# Patient Record
Sex: Female | Born: 1940 | Race: White | Hispanic: No | Smoking: Former smoker
Health system: Southern US, Community
[De-identification: ages and names within clinical notes are randomized; demographics above are authoritative.]

## PROBLEM LIST (undated history)

## (undated) DIAGNOSIS — I499 Cardiac arrhythmia, unspecified: Secondary | ICD-10-CM

## (undated) DIAGNOSIS — M1990 Unspecified osteoarthritis, unspecified site: Secondary | ICD-10-CM

## (undated) DIAGNOSIS — G459 Transient cerebral ischemic attack, unspecified: Secondary | ICD-10-CM

## (undated) DIAGNOSIS — Z86010 Personal history of colonic polyps: Secondary | ICD-10-CM

## (undated) DIAGNOSIS — E079 Disorder of thyroid, unspecified: Secondary | ICD-10-CM

## (undated) DIAGNOSIS — N289 Disorder of kidney and ureter, unspecified: Secondary | ICD-10-CM

## (undated) DIAGNOSIS — E785 Hyperlipidemia, unspecified: Secondary | ICD-10-CM

## (undated) DIAGNOSIS — K219 Gastro-esophageal reflux disease without esophagitis: Secondary | ICD-10-CM

## (undated) DIAGNOSIS — H269 Unspecified cataract: Secondary | ICD-10-CM

## (undated) DIAGNOSIS — I10 Essential (primary) hypertension: Secondary | ICD-10-CM

## (undated) DIAGNOSIS — E039 Hypothyroidism, unspecified: Secondary | ICD-10-CM

## (undated) DIAGNOSIS — T7840XA Allergy, unspecified, initial encounter: Secondary | ICD-10-CM

## (undated) DIAGNOSIS — D649 Anemia, unspecified: Secondary | ICD-10-CM

## (undated) DIAGNOSIS — J449 Chronic obstructive pulmonary disease, unspecified: Secondary | ICD-10-CM

## (undated) DIAGNOSIS — I639 Cerebral infarction, unspecified: Secondary | ICD-10-CM

## (undated) DIAGNOSIS — K5520 Angiodysplasia of colon without hemorrhage: Secondary | ICD-10-CM

## (undated) DIAGNOSIS — F419 Anxiety disorder, unspecified: Secondary | ICD-10-CM

## (undated) DIAGNOSIS — K5790 Diverticulosis of intestine, part unspecified, without perforation or abscess without bleeding: Secondary | ICD-10-CM

## (undated) DIAGNOSIS — K635 Polyp of colon: Secondary | ICD-10-CM

## (undated) HISTORY — DX: Unspecified cataract: H26.9

## (undated) HISTORY — DX: Transient cerebral ischemic attack, unspecified: G45.9

## (undated) HISTORY — DX: Angiodysplasia of colon without hemorrhage: K55.20

## (undated) HISTORY — PX: VAGINAL HYSTERECTOMY: SUR661

## (undated) HISTORY — PX: ESOPHAGOGASTRODUODENOSCOPY ENDOSCOPY: SHX5814

## (undated) HISTORY — DX: Allergy, unspecified, initial encounter: T78.40XA

## (undated) HISTORY — DX: Essential (primary) hypertension: I10

## (undated) HISTORY — DX: Hyperlipidemia, unspecified: E78.5

## (undated) HISTORY — DX: Anemia, unspecified: D64.9

## (undated) HISTORY — DX: Chronic obstructive pulmonary disease, unspecified: J44.9

## (undated) HISTORY — DX: Disorder of kidney and ureter, unspecified: N28.9

## (undated) HISTORY — PX: PARTIAL HYSTERECTOMY: SHX80

## (undated) HISTORY — DX: Diverticulosis of intestine, part unspecified, without perforation or abscess without bleeding: K57.90

## (undated) HISTORY — DX: Personal history of colonic polyps: Z86.010

## (undated) HISTORY — PX: APPENDECTOMY: SHX54

## (undated) HISTORY — DX: Polyp of colon: K63.5

---

## 1996-12-04 DIAGNOSIS — E785 Hyperlipidemia, unspecified: Secondary | ICD-10-CM | POA: Insufficient documentation

## 1996-12-04 DIAGNOSIS — E782 Mixed hyperlipidemia: Secondary | ICD-10-CM | POA: Insufficient documentation

## 2002-02-19 ENCOUNTER — Encounter: Payer: Self-pay | Admitting: Emergency Medicine

## 2002-02-19 ENCOUNTER — Emergency Department (HOSPITAL_COMMUNITY): Admission: EM | Admit: 2002-02-19 | Discharge: 2002-02-19 | Payer: Self-pay | Admitting: Emergency Medicine

## 2002-02-28 ENCOUNTER — Encounter: Admission: RE | Admit: 2002-02-28 | Discharge: 2002-02-28 | Payer: Self-pay | Admitting: Family Medicine

## 2002-02-28 ENCOUNTER — Encounter: Payer: Self-pay | Admitting: Family Medicine

## 2002-08-25 ENCOUNTER — Encounter: Payer: Self-pay | Admitting: Family Medicine

## 2002-08-25 ENCOUNTER — Encounter: Admission: RE | Admit: 2002-08-25 | Discharge: 2002-08-25 | Payer: Self-pay | Admitting: Family Medicine

## 2003-10-11 ENCOUNTER — Other Ambulatory Visit: Admission: RE | Admit: 2003-10-11 | Discharge: 2003-10-11 | Payer: Self-pay | Admitting: Family Medicine

## 2003-10-11 ENCOUNTER — Encounter (INDEPENDENT_AMBULATORY_CARE_PROVIDER_SITE_OTHER): Payer: Self-pay | Admitting: Internal Medicine

## 2003-10-11 LAB — CONVERTED CEMR LAB: Pap Smear: NORMAL

## 2003-11-04 ENCOUNTER — Emergency Department (HOSPITAL_COMMUNITY): Admission: EM | Admit: 2003-11-04 | Discharge: 2003-11-04 | Payer: Self-pay | Admitting: Emergency Medicine

## 2004-09-25 ENCOUNTER — Emergency Department (HOSPITAL_COMMUNITY): Admission: EM | Admit: 2004-09-25 | Discharge: 2004-09-25 | Payer: Self-pay | Admitting: Emergency Medicine

## 2004-09-26 ENCOUNTER — Ambulatory Visit: Payer: Self-pay | Admitting: Internal Medicine

## 2004-10-02 ENCOUNTER — Ambulatory Visit: Payer: Self-pay | Admitting: Internal Medicine

## 2004-12-13 ENCOUNTER — Ambulatory Visit: Payer: Self-pay | Admitting: Family Medicine

## 2005-06-02 ENCOUNTER — Ambulatory Visit: Payer: Self-pay | Admitting: Family Medicine

## 2005-08-01 ENCOUNTER — Ambulatory Visit: Payer: Self-pay | Admitting: Family Medicine

## 2005-08-19 ENCOUNTER — Ambulatory Visit: Payer: Self-pay | Admitting: Family Medicine

## 2006-05-18 ENCOUNTER — Ambulatory Visit: Payer: Self-pay | Admitting: Family Medicine

## 2006-05-22 ENCOUNTER — Ambulatory Visit: Payer: Self-pay | Admitting: Family Medicine

## 2006-06-20 ENCOUNTER — Emergency Department: Payer: Self-pay | Admitting: Emergency Medicine

## 2006-06-27 ENCOUNTER — Emergency Department: Payer: Self-pay | Admitting: Emergency Medicine

## 2006-07-29 ENCOUNTER — Ambulatory Visit: Payer: Self-pay | Admitting: Family Medicine

## 2006-08-07 ENCOUNTER — Ambulatory Visit: Payer: Self-pay | Admitting: Family Medicine

## 2007-01-22 ENCOUNTER — Encounter (INDEPENDENT_AMBULATORY_CARE_PROVIDER_SITE_OTHER): Payer: Self-pay | Admitting: Internal Medicine

## 2007-01-22 ENCOUNTER — Ambulatory Visit: Payer: Self-pay | Admitting: Family Medicine

## 2007-01-22 LAB — CONVERTED CEMR LAB
ALT: 21 units/L (ref 0–40)
AST: 24 units/L (ref 0–37)
Cholesterol: 105 mg/dL (ref 0–200)
HDL: 38.7 mg/dL — ABNORMAL LOW (ref >39.0)
LDL Cholesterol: 50 mg/dL (ref 0–99)
Total CHOL/HDL Ratio: 2.7
Triglycerides: 81 mg/dL (ref 0–149)
VLDL: 16 mg/dL (ref 0–40)
Vit D, 1,25-Dihydroxy: 35 (ref 20–57)

## 2007-02-01 ENCOUNTER — Telehealth: Payer: Self-pay | Admitting: Family Medicine

## 2007-04-21 ENCOUNTER — Telehealth (INDEPENDENT_AMBULATORY_CARE_PROVIDER_SITE_OTHER): Payer: Self-pay | Admitting: *Deleted

## 2007-05-14 ENCOUNTER — Emergency Department (HOSPITAL_COMMUNITY): Admission: EM | Admit: 2007-05-14 | Discharge: 2007-05-14 | Payer: Self-pay | Admitting: Emergency Medicine

## 2007-05-17 ENCOUNTER — Ambulatory Visit: Payer: Self-pay | Admitting: Family Medicine

## 2007-05-17 ENCOUNTER — Encounter (INDEPENDENT_AMBULATORY_CARE_PROVIDER_SITE_OTHER): Payer: Self-pay | Admitting: Internal Medicine

## 2007-05-17 DIAGNOSIS — F329 Major depressive disorder, single episode, unspecified: Secondary | ICD-10-CM | POA: Insufficient documentation

## 2007-05-17 DIAGNOSIS — E039 Hypothyroidism, unspecified: Secondary | ICD-10-CM | POA: Insufficient documentation

## 2007-05-17 DIAGNOSIS — R079 Chest pain, unspecified: Secondary | ICD-10-CM | POA: Insufficient documentation

## 2007-05-17 DIAGNOSIS — R0789 Other chest pain: Secondary | ICD-10-CM

## 2007-05-17 DIAGNOSIS — I498 Other specified cardiac arrhythmias: Secondary | ICD-10-CM | POA: Insufficient documentation

## 2007-05-18 LAB — CONVERTED CEMR LAB: TSH: 1.45 u[IU]/mL

## 2007-05-22 ENCOUNTER — Ambulatory Visit: Payer: Self-pay | Admitting: Internal Medicine

## 2007-05-22 ENCOUNTER — Inpatient Hospital Stay (HOSPITAL_COMMUNITY): Admission: EM | Admit: 2007-05-22 | Discharge: 2007-05-24 | Payer: Self-pay | Admitting: Emergency Medicine

## 2007-05-24 ENCOUNTER — Encounter: Payer: Self-pay | Admitting: Family Medicine

## 2007-05-26 ENCOUNTER — Ambulatory Visit: Payer: Self-pay | Admitting: Cardiology

## 2007-05-26 ENCOUNTER — Encounter: Payer: Self-pay | Admitting: Cardiology

## 2007-05-26 ENCOUNTER — Ambulatory Visit: Payer: Self-pay | Admitting: Family Medicine

## 2007-05-26 ENCOUNTER — Ambulatory Visit: Payer: Self-pay

## 2007-05-26 DIAGNOSIS — D539 Nutritional anemia, unspecified: Secondary | ICD-10-CM | POA: Insufficient documentation

## 2007-05-31 ENCOUNTER — Ambulatory Visit: Payer: Self-pay | Admitting: Cardiology

## 2007-06-03 ENCOUNTER — Ambulatory Visit: Payer: Self-pay | Admitting: Internal Medicine

## 2007-06-04 ENCOUNTER — Ambulatory Visit: Payer: Self-pay | Admitting: Internal Medicine

## 2007-06-08 ENCOUNTER — Encounter: Payer: Self-pay | Admitting: Internal Medicine

## 2007-06-11 ENCOUNTER — Ambulatory Visit: Payer: Self-pay | Admitting: Cardiology

## 2007-06-15 ENCOUNTER — Encounter: Payer: Self-pay | Admitting: Internal Medicine

## 2007-06-15 ENCOUNTER — Ambulatory Visit: Payer: Self-pay | Admitting: Internal Medicine

## 2007-06-15 ENCOUNTER — Encounter (INDEPENDENT_AMBULATORY_CARE_PROVIDER_SITE_OTHER): Payer: Self-pay | Admitting: Internal Medicine

## 2007-06-15 DIAGNOSIS — K635 Polyp of colon: Secondary | ICD-10-CM

## 2007-06-15 HISTORY — DX: Polyp of colon: K63.5

## 2007-06-18 ENCOUNTER — Ambulatory Visit: Payer: Self-pay | Admitting: Family Medicine

## 2007-06-18 ENCOUNTER — Encounter (INDEPENDENT_AMBULATORY_CARE_PROVIDER_SITE_OTHER): Payer: Self-pay | Admitting: Internal Medicine

## 2007-07-19 ENCOUNTER — Ambulatory Visit: Payer: Self-pay | Admitting: Family Medicine

## 2007-07-19 DIAGNOSIS — Z860100 Personal history of colon polyps, unspecified: Secondary | ICD-10-CM

## 2007-07-19 DIAGNOSIS — L03818 Cellulitis of other sites: Secondary | ICD-10-CM

## 2007-07-19 DIAGNOSIS — L02818 Cutaneous abscess of other sites: Secondary | ICD-10-CM | POA: Insufficient documentation

## 2007-07-19 HISTORY — DX: Personal history of colon polyps, unspecified: Z86.0100

## 2007-07-19 HISTORY — DX: Personal history of colonic polyps: Z86.010

## 2007-07-21 ENCOUNTER — Ambulatory Visit: Payer: Self-pay | Admitting: Internal Medicine

## 2007-07-22 LAB — CONVERTED CEMR LAB
ALT: 19 units/L (ref 0–35)
AST: 18 units/L (ref 0–37)
Albumin: 3.6 g/dL (ref 3.5–5.2)
Alkaline Phosphatase: 46 units/L (ref 39–117)
Bilirubin, Direct: 0.1 mg/dL (ref 0.0–0.3)
Cholesterol: 155 mg/dL (ref 0–200)
HDL: 51.1 mg/dL (ref >39.0)
LDL Cholesterol: 93 mg/dL (ref 0–99)
Total Bilirubin: 1.1 mg/dL (ref 0.3–1.2)
Total CHOL/HDL Ratio: 3
Total Protein: 6.6 g/dL (ref 6.0–8.3)
Triglycerides: 56 mg/dL (ref 0–149)
VLDL: 11 mg/dL (ref 0–40)

## 2007-09-15 ENCOUNTER — Encounter (INDEPENDENT_AMBULATORY_CARE_PROVIDER_SITE_OTHER): Payer: Self-pay | Admitting: Internal Medicine

## 2007-09-15 DIAGNOSIS — I10 Essential (primary) hypertension: Secondary | ICD-10-CM | POA: Insufficient documentation

## 2007-09-15 DIAGNOSIS — M810 Age-related osteoporosis without current pathological fracture: Secondary | ICD-10-CM | POA: Insufficient documentation

## 2007-09-24 ENCOUNTER — Ambulatory Visit: Payer: Self-pay | Admitting: Internal Medicine

## 2007-09-24 DIAGNOSIS — T148XXA Other injury of unspecified body region, initial encounter: Secondary | ICD-10-CM | POA: Insufficient documentation

## 2007-11-19 ENCOUNTER — Ambulatory Visit: Payer: Self-pay | Admitting: Family Medicine

## 2008-02-22 ENCOUNTER — Ambulatory Visit: Payer: Self-pay | Admitting: Family Medicine

## 2008-02-22 DIAGNOSIS — M79672 Pain in left foot: Secondary | ICD-10-CM

## 2008-02-22 DIAGNOSIS — M79671 Pain in right foot: Secondary | ICD-10-CM | POA: Insufficient documentation

## 2008-02-29 ENCOUNTER — Encounter (INDEPENDENT_AMBULATORY_CARE_PROVIDER_SITE_OTHER): Payer: Self-pay | Admitting: Internal Medicine

## 2008-04-05 ENCOUNTER — Ambulatory Visit: Payer: Self-pay | Admitting: Family Medicine

## 2008-04-05 ENCOUNTER — Telehealth (INDEPENDENT_AMBULATORY_CARE_PROVIDER_SITE_OTHER): Payer: Self-pay | Admitting: Internal Medicine

## 2008-04-18 ENCOUNTER — Telehealth (INDEPENDENT_AMBULATORY_CARE_PROVIDER_SITE_OTHER): Payer: Self-pay | Admitting: Internal Medicine

## 2008-07-17 ENCOUNTER — Encounter: Payer: Self-pay | Admitting: Family Medicine

## 2008-08-30 ENCOUNTER — Ambulatory Visit: Payer: Self-pay | Admitting: Family Medicine

## 2008-09-05 LAB — CONVERTED CEMR LAB
ALT: 29 units/L (ref 0–35)
AST: 25 units/L (ref 0–37)
BUN: 23 mg/dL (ref 6–23)
Basophils Absolute: 0 10*3/uL (ref 0.0–0.1)
Basophils Relative: 0.4 % (ref 0.0–3.0)
CO2: 29 meq/L (ref 19–32)
Calcium: 9.7 mg/dL (ref 8.4–10.5)
Chloride: 109 meq/L (ref 96–112)
Cholesterol: 192 mg/dL (ref 0–200)
Creatinine, Ser: 1.2 mg/dL (ref 0.4–1.2)
Eosinophils Absolute: 0.3 10*3/uL (ref 0.0–0.7)
Eosinophils Relative: 4.8 % (ref 0.0–5.0)
GFR calc Af Amer: 58 mL/min
GFR calc non Af Amer: 48 mL/min
Glucose, Bld: 90 mg/dL (ref 70–99)
HCT: 37.1 % (ref 36.0–46.0)
HDL: 56.6 mg/dL (ref >39.0)
Hemoglobin: 12.6 g/dL (ref 12.0–15.0)
LDL Cholesterol: 108 mg/dL — ABNORMAL HIGH (ref 0–99)
Lymphocytes Relative: 25.4 % (ref 12.0–46.0)
MCHC: 34.1 g/dL (ref 30.0–36.0)
MCV: 98.3 fL (ref 78.0–100.0)
Monocytes Absolute: 0.6 10*3/uL (ref 0.1–1.0)
Monocytes Relative: 9.4 % (ref 3.0–12.0)
Neutro Abs: 4 10*3/uL (ref 1.4–7.7)
Neutrophils Relative %: 60 % (ref 43.0–77.0)
Platelets: 206 10*3/uL (ref 150–400)
Potassium: 5.3 meq/L — ABNORMAL HIGH (ref 3.5–5.1)
RBC: 3.77 M/uL — ABNORMAL LOW (ref 3.87–5.11)
RDW: 12.4 % (ref 11.5–14.6)
Sodium: 143 meq/L (ref 135–145)
TSH: 6.13 microintl units/mL — ABNORMAL HIGH (ref 0.35–5.50)
Total CHOL/HDL Ratio: 3.4
Triglycerides: 137 mg/dL (ref 0–149)
VLDL: 27 mg/dL (ref 0–40)
WBC: 6.6 10*3/uL (ref 4.5–10.5)

## 2008-10-10 ENCOUNTER — Ambulatory Visit: Payer: Self-pay | Admitting: Family Medicine

## 2008-10-11 LAB — CONVERTED CEMR LAB
Potassium: 4.5 meq/L (ref 3.5–5.1)
TSH: 4.51 microintl units/mL (ref 0.35–5.50)

## 2008-10-13 ENCOUNTER — Ambulatory Visit: Payer: Self-pay | Admitting: Family Medicine

## 2008-10-13 DIAGNOSIS — M545 Low back pain, unspecified: Secondary | ICD-10-CM | POA: Insufficient documentation

## 2008-10-13 DIAGNOSIS — J040 Acute laryngitis: Secondary | ICD-10-CM | POA: Insufficient documentation

## 2008-11-03 ENCOUNTER — Encounter (INDEPENDENT_AMBULATORY_CARE_PROVIDER_SITE_OTHER): Payer: Self-pay | Admitting: Internal Medicine

## 2008-11-03 ENCOUNTER — Other Ambulatory Visit: Admission: RE | Admit: 2008-11-03 | Discharge: 2008-11-03 | Payer: Self-pay | Admitting: Family Medicine

## 2008-11-03 ENCOUNTER — Ambulatory Visit: Payer: Self-pay | Admitting: Family Medicine

## 2008-11-03 DIAGNOSIS — Z780 Asymptomatic menopausal state: Secondary | ICD-10-CM | POA: Insufficient documentation

## 2008-11-03 LAB — HM PAP SMEAR

## 2008-11-07 ENCOUNTER — Telehealth (INDEPENDENT_AMBULATORY_CARE_PROVIDER_SITE_OTHER): Payer: Self-pay | Admitting: Internal Medicine

## 2008-11-07 ENCOUNTER — Encounter (INDEPENDENT_AMBULATORY_CARE_PROVIDER_SITE_OTHER): Payer: Self-pay | Admitting: *Deleted

## 2008-11-17 ENCOUNTER — Ambulatory Visit: Payer: Self-pay | Admitting: Family Medicine

## 2008-11-22 ENCOUNTER — Encounter (INDEPENDENT_AMBULATORY_CARE_PROVIDER_SITE_OTHER): Payer: Self-pay | Admitting: *Deleted

## 2008-11-22 LAB — CONVERTED CEMR LAB
OCCULT 1: NEGATIVE
OCCULT 2: NEGATIVE
OCCULT 3: NEGATIVE

## 2009-04-11 ENCOUNTER — Ambulatory Visit: Payer: Self-pay | Admitting: Family Medicine

## 2009-04-13 LAB — CONVERTED CEMR LAB: TSH: 0.47 microintl units/mL (ref 0.35–5.50)

## 2009-05-07 ENCOUNTER — Telehealth: Payer: Self-pay | Admitting: Family Medicine

## 2009-07-10 ENCOUNTER — Ambulatory Visit: Payer: Self-pay | Admitting: Family Medicine

## 2009-07-25 ENCOUNTER — Encounter (INDEPENDENT_AMBULATORY_CARE_PROVIDER_SITE_OTHER): Payer: Self-pay | Admitting: Internal Medicine

## 2009-08-07 ENCOUNTER — Encounter (INDEPENDENT_AMBULATORY_CARE_PROVIDER_SITE_OTHER): Payer: Self-pay | Admitting: Internal Medicine

## 2009-08-07 ENCOUNTER — Encounter: Payer: Self-pay | Admitting: Family Medicine

## 2009-09-26 ENCOUNTER — Telehealth (INDEPENDENT_AMBULATORY_CARE_PROVIDER_SITE_OTHER): Payer: Self-pay | Admitting: Internal Medicine

## 2009-10-02 ENCOUNTER — Telehealth (INDEPENDENT_AMBULATORY_CARE_PROVIDER_SITE_OTHER): Payer: Self-pay | Admitting: Internal Medicine

## 2009-10-03 ENCOUNTER — Ambulatory Visit: Payer: Self-pay | Admitting: Family Medicine

## 2009-10-04 LAB — CONVERTED CEMR LAB
ALT: 23 units/L (ref 0–35)
AST: 24 units/L (ref 0–37)
BUN: 20 mg/dL (ref 6–23)
CO2: 30 meq/L (ref 19–32)
Calcium: 9.2 mg/dL (ref 8.4–10.5)
Chloride: 107 meq/L (ref 96–112)
Cholesterol: 164 mg/dL (ref 0–200)
Creatinine, Ser: 1.2 mg/dL (ref 0.4–1.2)
GFR calc non Af Amer: 47.55 mL/min (ref >60)
Glucose, Bld: 91 mg/dL (ref 70–99)
HDL: 53.4 mg/dL (ref >39.00)
LDL Cholesterol: 93 mg/dL (ref 0–99)
Potassium: 5.1 meq/L (ref 3.5–5.1)
Sodium: 143 meq/L (ref 135–145)
TSH: 1.21 microintl units/mL (ref 0.35–5.50)
Total CHOL/HDL Ratio: 3
Triglycerides: 87 mg/dL (ref 0.0–149.0)
VLDL: 17.4 mg/dL (ref 0.0–40.0)
Vit D, 25-Hydroxy: 33 ng/mL (ref 30–89)

## 2009-10-09 ENCOUNTER — Ambulatory Visit: Payer: Self-pay | Admitting: Family Medicine

## 2009-10-15 LAB — CONVERTED CEMR LAB
Basophils Absolute: 0.1 10*3/uL (ref 0.0–0.1)
Basophils Relative: 1.5 % (ref 0.0–3.0)
Eosinophils Absolute: 0.4 10*3/uL (ref 0.0–0.7)
Eosinophils Relative: 5 % (ref 0.0–5.0)
Folate: 14.3 ng/mL
HCT: 33.2 % — ABNORMAL LOW (ref 36.0–46.0)
Hemoglobin: 11 g/dL — ABNORMAL LOW (ref 12.0–15.0)
Iron: 70 ug/dL (ref 42–145)
Lymphocytes Relative: 21.4 % (ref 12.0–46.0)
Lymphs Abs: 1.6 10*3/uL (ref 0.7–4.0)
MCHC: 33.3 g/dL (ref 30.0–36.0)
MCV: 102.2 fL — ABNORMAL HIGH (ref 78.0–100.0)
Monocytes Absolute: 1.5 10*3/uL — ABNORMAL HIGH (ref 0.1–1.0)
Monocytes Relative: 19.5 % — ABNORMAL HIGH (ref 3.0–12.0)
Neutro Abs: 3.9 10*3/uL (ref 1.4–7.7)
Neutrophils Relative %: 52.6 % (ref 43.0–77.0)
Platelets: 198 10*3/uL (ref 150.0–400.0)
RBC: 3.25 M/uL — ABNORMAL LOW (ref 3.87–5.11)
RDW: 12 % (ref 11.5–14.6)
Saturation Ratios: 20.3 % (ref 20.0–50.0)
Transferrin: 246.6 mg/dL (ref 212.0–360.0)
Vitamin B-12: 849 pg/mL (ref 211–911)
WBC: 7.5 10*3/uL (ref 4.5–10.5)

## 2009-10-26 ENCOUNTER — Ambulatory Visit: Payer: Self-pay | Admitting: Family Medicine

## 2009-10-26 LAB — FECAL OCCULT BLOOD, GUAIAC: Fecal Occult Blood: NEGATIVE

## 2009-10-26 LAB — CONVERTED CEMR LAB
OCCULT 1: NEGATIVE
OCCULT 2: NEGATIVE
OCCULT 3: NEGATIVE

## 2009-10-29 ENCOUNTER — Encounter (INDEPENDENT_AMBULATORY_CARE_PROVIDER_SITE_OTHER): Payer: Self-pay | Admitting: *Deleted

## 2009-11-15 ENCOUNTER — Emergency Department (HOSPITAL_COMMUNITY)
Admission: EM | Admit: 2009-11-15 | Discharge: 2009-11-15 | Payer: Self-pay | Source: Home / Self Care | Admitting: Emergency Medicine

## 2009-11-21 ENCOUNTER — Ambulatory Visit: Payer: Self-pay | Admitting: Family Medicine

## 2009-12-06 ENCOUNTER — Telehealth: Payer: Self-pay | Admitting: Family Medicine

## 2010-01-02 ENCOUNTER — Ambulatory Visit: Payer: Self-pay | Admitting: Family Medicine

## 2010-01-24 ENCOUNTER — Telehealth: Payer: Self-pay | Admitting: Family Medicine

## 2010-02-13 ENCOUNTER — Telehealth: Payer: Self-pay | Admitting: Family Medicine

## 2010-03-05 ENCOUNTER — Emergency Department: Payer: Self-pay | Admitting: Internal Medicine

## 2010-03-05 ENCOUNTER — Encounter: Payer: Self-pay | Admitting: Family Medicine

## 2010-03-06 ENCOUNTER — Telehealth: Payer: Self-pay | Admitting: Family Medicine

## 2010-03-19 ENCOUNTER — Ambulatory Visit: Payer: Self-pay | Admitting: Family Medicine

## 2010-03-19 DIAGNOSIS — M25539 Pain in unspecified wrist: Secondary | ICD-10-CM | POA: Insufficient documentation

## 2010-03-20 ENCOUNTER — Telehealth: Payer: Self-pay | Admitting: Family Medicine

## 2010-04-11 ENCOUNTER — Telehealth: Payer: Self-pay | Admitting: Family Medicine

## 2010-04-17 ENCOUNTER — Telehealth: Payer: Self-pay | Admitting: Family Medicine

## 2010-05-21 ENCOUNTER — Encounter (INDEPENDENT_AMBULATORY_CARE_PROVIDER_SITE_OTHER): Payer: Self-pay | Admitting: *Deleted

## 2010-05-28 ENCOUNTER — Encounter (INDEPENDENT_AMBULATORY_CARE_PROVIDER_SITE_OTHER): Payer: Self-pay | Admitting: *Deleted

## 2010-07-01 ENCOUNTER — Encounter (INDEPENDENT_AMBULATORY_CARE_PROVIDER_SITE_OTHER): Payer: Self-pay | Admitting: *Deleted

## 2010-07-02 ENCOUNTER — Ambulatory Visit: Payer: Self-pay | Admitting: Family Medicine

## 2010-07-02 ENCOUNTER — Telehealth: Payer: Self-pay | Admitting: Family Medicine

## 2010-07-03 ENCOUNTER — Ambulatory Visit: Payer: Self-pay | Admitting: Internal Medicine

## 2010-07-19 ENCOUNTER — Ambulatory Visit: Payer: Self-pay | Admitting: Internal Medicine

## 2010-07-19 LAB — HM COLONOSCOPY

## 2010-07-27 ENCOUNTER — Encounter: Payer: Self-pay | Admitting: Internal Medicine

## 2010-08-02 ENCOUNTER — Encounter: Payer: Self-pay | Admitting: Family Medicine

## 2010-08-02 LAB — HM MAMMOGRAPHY: HM Mammogram: NORMAL

## 2010-08-06 ENCOUNTER — Encounter: Payer: Self-pay | Admitting: Family Medicine

## 2010-09-25 ENCOUNTER — Ambulatory Visit: Payer: Self-pay | Admitting: Family Medicine

## 2010-11-05 NOTE — Assessment & Plan Note (Signed)
Summary: RIGHT ARM PAIN,KNOT ON WRIST/CLE   Vital Signs:  Patient profile:   70 year old female Height:      62.5 inches Weight:      157.13 pounds BMI:     28.38 Temp:     98.3 degrees F oral Pulse rate:   60 / minute Pulse rhythm:   regular BP sitting:   122 / 62  (left arm) Cuff size:   regular  Vitals Entered By: Linde Gillis CMA Duncan Dull) (March 19, 2010 8:53 AM) CC: right arm pain, knot on right wrist   History of Present Illness: 70 yo here for right wrist pain.  Fell in kitchen on 5/31. Had xrays done in ER which were negative but since then, has developped more pain and swelling in right wrist.  Pain with rest or motion.  Has FROM. Normal strength. Occassionally gets tingling into her finger tips.  Current Medications (verified): 1)  Benazepril Hcl 10 Mg Tabs (Benazepril Hcl) .... Take 1 Tablet By Mouth Once A Day 2)  Lipitor 20 Mg Tabs (Atorvastatin Calcium) .... Take 1 Tablet By Mouth Once A Day 3)  Adult Aspirin Low Strength 81 Mg  Tbdp (Aspirin) .... Takes 2 By Mouth Every Morning 4)  Alprazolam 0.25 Mg  Tabs (Alprazolam) .... 1/2 -1 Q 6 Hours As Needed As Directed 5)  Citracal + D 250-200 Mg-Unit  Tabs (Calcium Citrate-Vitamin D) .... As Before 6)  Cvs Iron 325 (65 Fe) Mg  Tabs (Ferrous Sulfate) .... One Tab By Mouth Once Daily 7)  Clarinex 5 Mg  Tabs (Desloratadine) .... Take 1 Tablet By Mouth Once A Day As Needed Itch 8)  Levothroid 100 Mcg Tabs (Levothyroxine Sodium) .Marland Kitchen.. 1 Once Daily For Thyroid 9)  Vitamin B-12 500 Mcg  Tabs (Cyanocobalamin) .... One Daily 10)  Vitamin D 1000 Unit  Tabs (Cholecalciferol) .... Take Two Daily 11)  Multivitamins   Tabs (Multiple Vitamin) .... Take One Daily 12)  Fish Oil   Oil (Fish Oil) .... Take Two Daily 13)  Hair, Skin and Nails .... Otc As Directed.  Takes One Daily 14)  Valacyclovir Hcl 1 Gm Tabs (Valacyclovir Hcl) .... 2 Tab By Mouth Two Times A Day X 1 Day  Allergies: 1)  Codeine Phosphate (Codeine Phosphate)  Review  of Systems      See HPI General:  Denies fever. MS:  Complains of joint pain, joint redness, and joint swelling; denies loss of strength and muscle weakness.  Physical Exam  General:  Well-developed,well-nourished,in no acute distress; alert,appropriate and cooperative throughout examination Msk:  Right wrist- swollen, ecyhomosis, FROM, TTP over lateral aspect.normal strength. Psych:  Cognition and judgment appear intact. Alert and cooperative with normal attention span and concentration. No apparent delusions, illusions, hallucinations   Impression & Recommendations:  Problem # 1:  WRIST PAIN, RIGHT (ICD-719.43) Assessment New Will get repeat XRAY today. 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  Orders: T-Wrist Comp Right (73110TC)  Complete Medication List: 1)  Benazepril Hcl 10 Mg Tabs (Benazepril hcl) .... Take 1 tablet by mouth once a day 2)  Lipitor 20 Mg Tabs (Atorvastatin calcium) .... Take 1 tablet by mouth once a day 3)  Adult Aspirin Low Strength 81 Mg Tbdp (Aspirin) .... Takes 2 by mouth every morning 4)  Alprazolam 0.25 Mg Tabs (Alprazolam) .... 1/2 -1 q 6 hours as needed as directed 5)  Citracal + D 250-200 Mg-unit Tabs (Calcium citrate-vitamin d) .... As before 6)  Cvs Iron 325 (65 Fe) Mg Tabs (Ferrous sulfate) .... One tab by mouth once daily 7)  Clarinex 5 Mg Tabs (Desloratadine) .... Take 1 tablet by mouth once a day as needed itch 8)  Levothroid 100 Mcg Tabs (Levothyroxine sodium) .Marland Kitchen.. 1 once daily for thyroid 9)  Vitamin B-12 500 Mcg Tabs (Cyanocobalamin) .... One daily 10)  Vitamin D 1000 Unit Tabs (Cholecalciferol) .... Take two daily 11)  Multivitamins Tabs (Multiple vitamin) .... Take one daily 12)  Fish Oil Oil (Fish oil) .... Take two daily 13)  Hair, Skin and Nails  .... Otc as directed.  takes one daily 14)  Valacyclovir Hcl 1 Gm Tabs (Valacyclovir hcl) .... 2 tab by mouth two times a day x 1 day  Current Allergies  (reviewed today): CODEINE PHOSPHATE (CODEINE PHOSPHATE)

## 2010-11-05 NOTE — Assessment & Plan Note (Signed)
Summary: FLU SHOT/Carla Lyons/DLO   Nurse Visit   Allergies: 1)  Codeine Phosphate (Codeine Phosphate)  Immunizations Administered:  Influenza Vaccine # 1:    Vaccine Type: Fluvax 3+    Site: left deltoid    Mfr: [employer]axoSmith[employer]    Dose: 0.5 ml    Route: IM    Given by: Mervin Hack CMA (AAMA)    Exp. Date: 04/05/2011    Lot #: [id number]    VIS given: 04/30/10 version given July 02, 2010.  Flu Vaccine Consent Questions:    Do you have a history of severe allergic reactions to this vaccine? no    Any prior history of allergic reactions to egg and/or gelatin? no    Do you have a sensitivity to the preservative Thimersol? no    Do you have a past history of Guillan-Barre Syndrome? no    Do you currently have an acute febrile illness? no    Have you ever had a severe reaction to latex? no    Vaccine information given and explained to patient? yes    Are you currently pregnant? no  Orders Added: 1)  Flu Vaccine 51yrs + [90658] 2)  Admin 1st Vaccine [15400]

## 2010-11-05 NOTE — Letter (Signed)
Summary: Previsit letter  [facility]  [address] [place], [place] [address]   Phone: [phone]  Fax: [phone]       05/28/2010 MRN: [id number]  [facility] [address] [place], [place]  [address]  Dear Ms. Staubs,  Welcome to the Gastroenterology Division at [facility]    You are scheduled to see a nurse for your pre-procedure visit on 07-02-10 at 11:00a.m. on the 3rd floor at [facility], [address]  We ask that you try to arrive at our office 15 minutes prior to your appointment time to allow for check-in.  Your nurse visit will consist of discussing your medical and surgical history, your immediate family medical history, and your medications.    Please bring a complete list of all your medications or, if you prefer, bring the medication bottles and we will list them.  We will need to be aware of both prescribed and over the counter drugs.  We will need to know exact dosage information as well.  If you are on blood thinners (Coumadin, Plavix, Aggrenox, Ticlid, etc.) please call our office today/prior to your appointment, as we need to consult with your physician about holding your medication.   Please be prepared to read and sign documents such as consent forms, a financial agreement, and acknowledgement forms.  If necessary, and with your consent, a friend or relative is welcome to sit-in on the nurse visit with you.  Please bring your insurance card so that we may make a copy of it.  If your insurance requires a referral to see a specialist, please bring your referral form from your primary care physician.  No co-pay is required for this nurse visit.     If you cannot keep your appointment, please call [phone] to cancel or reschedule prior to your appointment date.  This allows [place] the opportunity to schedule an appointment for another patient in need of care.    Thank you for choosing [facility] for your medical needs.  We  appreciate the opportunity to care for you.  Please visit [place] at our website  to learn more about our practice.                     Sincerely.                                                                                                                   The Gastroenterology Division

## 2010-11-05 NOTE — Progress Notes (Signed)
Summary: refill request for valacyclovir  Phone Note Refill Request Message from:  Fax from Pharmacy  Refills Requested: Medication #1:  VALACYCLOVIR HCL 1 GM TABS 2 tab by mouth two times a day x 1 day.   Last Refilled: 01/02/2010 Faxed request from [facility].  Initial call taken by: Lowella Petties CMA,  January 24, 2010 8:46 AM    Prescriptions: VALACYCLOVIR HCL 1 GM TABS (VALACYCLOVIR HCL) 2 tab by mouth two times a day x 1 day  #30 x 0   Entered and Authorized by:   Ruthe Mannan MD   Signed by:   Ruthe Mannan MD on 01/24/2010   Method used:   Electronically to        [facility]* (retail)       [address]       [place], [place]  [address]       Ph: [phone]       Fax: [phone]   RxID:   [id number]

## 2010-11-05 NOTE — Assessment & Plan Note (Signed)
Summary: SORE IN MOUTH/ COLD SORES/BILLIE PATIENT/[facility]   Vital Signs:  Patient profile:   70 year old female Height:      62.5 inches Weight:      149.8 pounds BMI:     27.06 Temp:     97.8 degrees F oral Pulse rate:   60 / minute Pulse rhythm:   regular BP sitting:   100 / 62  (left arm) Cuff size:   regular  Vitals Entered By: Benny Lennert CMA Duncan Dull) (November 21, 2009 2:21 PM) CC: sore in mouth and cold sores Is Patient Diabetic? No   History of Present Illness: 2 weeks ago after URI viral..noted blisters on inside of lip, top and bottom...with white discharge.  Initially painful..improved now.  Pharmacist suggested Canka gel..helped minimally.  Then tried glyoxide. helped minimally.  No fatigue, no fever. Feels well otherwise. no new medicaines. Wears dentures, no new oral meds or objects.    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 After Caffeine  (ICD-530.81) 11)  Osteoporosis  (ICD-733.00) 12)  Hypertension  (ICD-401.9) 13)  Cellulitis/abscess, Site Nec  (ICD-682.8) 14)  Anemia Nos  (ICD-285.9) 15)  Chest Discomfort  (ICD-786.59) 16)  Hypothyroidism  (ICD-244.9) 17)  Depression  (ICD-311) 18)  Bradycardia  (ICD-427.89)  Current Medications (verified): 1)  Benazepril Hcl 10 Mg Tabs (Benazepril Hcl) .... Take 1 Tablet By Mouth Once A Day 2)  Lipitor 20 Mg Tabs (Atorvastatin Calcium) .... Take 1 Tablet By Mouth Once A Day 3)  Adult Aspirin Low Strength 81 Mg  Tbdp (Aspirin) .... Takes 2 By Mouth Every Morning 4)  Alprazolam 0.25 Mg  Tabs (Alprazolam) .... 1/2 -1 Q 6 Hours As Needed As Directed 5)  Citracal + D  250-200 Mg-Unit  Tabs (Calcium Citrate-Vitamin D) .... As Before 6)  Cvs Iron 325 (65 Fe) Mg  Tabs (Ferrous Sulfate) .... One Tab By Mouth Once Daily 7)  Clarinex 5 Mg  Tabs (Desloratadine) .... Take 1 Tablet By Mouth Once A Day As Needed Itch 8)  Levothroid 100 Mcg Tabs (Levothyroxine Sodium) .Marland Kitchen.. 1 Once Daily For Thyroid 9)  Vitamin B-12 500 Mcg  Tabs (Cyanocobalamin) .... One Daily 10)  Vitamin D 1000 Unit  Tabs (Cholecalciferol) .... Take Two Daily 11)  Multivitamins   Tabs (Multiple Vitamin) .... Take One Daily 12)  Fish Oil   Oil (Fish Oil) .... Take Two Daily 13)  Hair, Skin and Nails .... Otc As Directed.  Takes One Daily 14)  Valacyclovir Hcl 1 Gm Tabs (Valacyclovir Hcl) .... 2 Tab By Mouth Two Times A Day X 1 Day  Allergies: 1)  Codeine Phosphate (Codeine Phosphate)  Past History:  Past medical, surgical, family and social histories (including risk factors) reviewed, and no changes noted (except as noted below).  Past Medical History: Reviewed history from 09/15/2007 and no changes required. colon polyps--06/15/07 Hypertension Osteoporosis  Past Surgical History: Reviewed history from 09/15/2007 and no changes required. Transthroracic echo---05/18/2007--neg Holter monitor--8/08--neg colonoscopy--06/15/07--polyps Hysterectomy, partial - age 35 Appendectomy C-Section x 2 Abd. U/S - fatty liver 05/03 Head CT with and without wnl 08/23/02 DEXA 08/01/05  Family History: Reviewed  history from 09/15/2007 and no changes required. Father: Died at age 53, AAA surg - Embolus, osteoarthritis Mother: Alive, bypass surgery (83) - CAD Siblings: 2 brothers, one with arthritis and elevated BP, one L & W.                1sister L & W., one deceased at age 18 with lung disease.    MGF- died at age 40 or 64 with MI MGM died at age 28 - old age CV - none HBP - none DM - none No cancer ETOH/Drug Abuse - none  Social History: Reviewed history from 08/30/2008 and no changes  required. Marital Status:widow--husband died 2005-01-10 Children: 2 Occupation: retired   Review of Systems General:  Denies fatigue and fever. CV:  Denies chest pain or discomfort. Resp:  Denies shortness of breath. GI:  Denies abdominal pain.  Physical Exam  General:  Well-developed,well-nourished,in no acute distress; alert,appropriate and cooperative throughout examination Eyes:  No corneal or conjunctival inflammation noted. EOMI. Perrla. Funduscopic exam benign, without hemorrhages, exudates or papilledema. Vision grossly normal. Ears:  External ear exam shows no significant lesions or deformities.  Otoscopic examination reveals clear canals, tympanic membranes are intact bilaterally without bulging, retraction, inflammation or discharge. Hearing is grossly normal bilaterally. Nose:  External nasal examination shows no deformity or inflammation. Nasal mucosa are pink and moist without lesions or exudates. Mouth:  erythematous lesion inner bottom lip, not at vermillion border, no blister Neck:  no carotid bruit or thyromegaly no cervical or supraclavicular lymphadenopathy  Lungs:  Normal respiratory effort, chest expands symmetrically. Lungs are clear to auscultation, no crackles or wheezes. Heart:  Normal rate and regular rhythm. S1 and S2 normal without gallop, murmur, click, rub or other extra sounds.   Impression & Recommendations:  Problem # 1:  ORAL ULCER (ICD-528.9) ? due to irritant vs herpes simplex. Given persistance and pain associated.Marland Kitchenalthough now slowly improving. treat with 1 day valtrex. Avoid irritants. Call if not imrpoving as expected.   Complete Medication List: 1)  Benazepril Hcl 10 Mg Tabs (Benazepril hcl) .... Take 1 tablet by mouth once a day 2)  Lipitor 20 Mg Tabs (Atorvastatin calcium) .... Take 1 tablet by mouth once a day 3)  Adult Aspirin Low Strength 81 Mg Tbdp (Aspirin) .... Takes 2 by mouth every morning 4)  Alprazolam 0.25 Mg Tabs (Alprazolam) ....  1/2 -1 q 6 hours as needed as directed 5)  Citracal + D 250-200 Mg-unit Tabs (Calcium citrate-vitamin d) .... As before 6)  Cvs Iron 325 (65 Fe) Mg Tabs (Ferrous sulfate) .... One tab by mouth once daily 7)  Clarinex 5 Mg Tabs (Desloratadine) .... Take 1 tablet by mouth once a day as needed itch 8)  Levothroid 100 Mcg Tabs (Levothyroxine sodium) .Marland Kitchen.. 1 once daily for thyroid 9)  Vitamin B-12 500 Mcg Tabs (Cyanocobalamin) .... One daily 10)  Vitamin D 1000 Unit Tabs (Cholecalciferol) .... Take two daily 11)  Multivitamins Tabs (Multiple vitamin) .... Take one daily 12)  Fish Oil Oil (Fish oil) .... Take two daily 13)  Hair, Skin and Nails  .... Otc as directed.  takes one daily 14)  Valacyclovir Hcl 1 Gm Tabs (Valacyclovir hcl) .... 2 tab by mouth two times a day x 1 day Prescriptions: VALACYCLOVIR HCL 1 GM TABS (VALACYCLOVIR HCL) 2 tab by mouth two times a day x 1 day  #4 x 0   Entered and Authorized by:   Kerby Nora MD   Signed by:  Kerby Nora MD on 11/21/2009   Method used:   Electronically to        [facility]* (retail)       [address]       [place], [place]  [address]       Ph: [phone]       Fax: [phone]   RxID:   [id number]   Current Allergies (reviewed today): CODEINE PHOSPHATE (CODEINE PHOSPHATE)

## 2010-11-05 NOTE — Miscellaneous (Signed)
Summary: [facility] Previsit/prep  Clinical Lists Changes  Medications: Added new medication of MOVIPREP 100 GM  SOLR (PEG-KCL-NACL-NASULF-NA ASC-C) As per prep instructions. - Signed Rx of MOVIPREP 100 GM  SOLR (PEG-KCL-NACL-NASULF-NA ASC-C) As per prep instructions.;  #1 x 0;  Signed;  Entered by: Wyona Almas RN;  Authorized by: Iva Boop MD, [facility];  Method used: Electronically to [facility] [address], [place], [place]  [address], Ph: [phone], Fax: [phone] Allergies: Changed allergy or adverse reaction from CODEINE PHOSPHATE (CODEINE PHOSPHATE) to CODEINE PHOSPHATE (CODEINE PHOSPHATE)    Prescriptions: MOVIPREP 100 GM  SOLR (PEG-KCL-NACL-NASULF-NA ASC-C) As per prep instructions.  #1 x 0   Entered by:   Wyona Almas RN   Authorized by:   Iva Boop MD, [employer]   Signed by:   Wyona Almas RN on 07/03/2010   Method used:   Electronically to        [facility]* (retail)       [address]       [place], [place]  [address]       Ph: [phone]       Fax: [phone]   RxID:   [id number]

## 2010-11-05 NOTE — Letter (Signed)
Summary: Colonoscopy Letter  [facility]  [address] [place], [place] [address]   Phone: [phone]  Fax: [phone]      May 21, 2010 MRN: [id number]   [facility] [address] [place], [place]  [address]   Dear Ms. Coy,   According to your medical record, it is time for you to schedule a Colonoscopy. The American Cancer Society recommends this procedure as a method to detect early colon cancer. Patients with a family history of colon cancer, or a personal history of colon polyps or inflammatory bowel disease are at increased risk.  This letter has beeen generated based on the recommendations made at the time of your procedure. If you feel that in your particular situation this may no longer apply, please contact our office.  Please call our office at [phone] to schedule this appointment or to update your records at your earliest convenience.  Thank you for cooperating with [place] to provide you with the very best care possible.   Sincerely,   Iva Boop, M.D.  [facility] [phone]

## 2010-11-05 NOTE — Progress Notes (Signed)
Summary: pt has diarrhea  Phone Note Call from Patient   Caller: Patient Call For: Ruthe Mannan MD Summary of Call: Pt called to report that she has had diarrhea all afternoon, hit her all at once.  Asks what she can do.  No vomiting but some nausea.  Some abd pain, but not severe.  No fever.  Advised clear fluids, rest, avoid dairy products.  She took 2 immodium but I told her that you would advise against that, let the virus run it's course.  She will call tomorrow and let us know how she is doing.  She knows to seek care if she develops fever or sever abd pain. Initial call taken by: Lowella Petties CMA,  April 17, 2010 4:32 PM  Follow-up for Phone Call        agreed.

## 2010-11-05 NOTE — Assessment & Plan Note (Signed)
Summary: cold sores   Vital Signs:  Patient profile:   70 year old female Height:      62.5 inches Weight:      153.50 pounds BMI:     27.73 Temp:     98.7 degrees F oral Pulse rate:   80 / minute Pulse rhythm:   regular BP sitting:   130 / 70  (left arm) Cuff size:   regular  Vitals Entered By: Linde Gillis CMA Duncan Dull) (January 02, 2010 11:21 AM) CC: blisters in mouth   History of Present Illness: Here for recurrent blisters in mouth.  Was seen in february for cold sore in mouth after after URI viral..noted blisters on inside of lip, top and bottom...with white discharge.  Painful, tried Canka did not help.  Was given prescription for Valtrex, it helped but the problem has recurred twice since she was seen here, most recently yesterday.  No fatigue, no fever. Feels well otherwise. no new medicaines. Wears dentures, no new oral meds or objects.   She is under a lot of stress at home.  Current Medications (verified): 1)  Benazepril Hcl 10 Mg Tabs (Benazepril Hcl) .... Take 1 Tablet By Mouth Once A Day 2)  Lipitor 20 Mg Tabs (Atorvastatin Calcium) .... Take 1 Tablet By Mouth Once A Day 3)  Adult Aspirin Low Strength 81 Mg  Tbdp (Aspirin) .... Takes 2 By Mouth Every Morning 4)  Alprazolam 0.25 Mg  Tabs (Alprazolam) .... 1/2 -1 Q 6 Hours As Needed As Directed 5)  Citracal + D 250-200 Mg-Unit  Tabs (Calcium Citrate-Vitamin D) .... As Before 6)  Cvs Iron 325 (65 Fe) Mg  Tabs (Ferrous Sulfate) .... One Tab By Mouth Once Daily 7)  Clarinex 5 Mg  Tabs (Desloratadine) .... Take 1 Tablet By Mouth Once A Day As Needed Itch 8)  Levothroid 100 Mcg Tabs (Levothyroxine Sodium) .Marland Kitchen.. 1 Once Daily For Thyroid 9)  Vitamin B-12 500 Mcg  Tabs (Cyanocobalamin) .... One Daily 10)  Vitamin D 1000 Unit  Tabs (Cholecalciferol) .... Take Two Daily 11)  Multivitamins   Tabs (Multiple Vitamin) .... Take One Daily 12)  Fish Oil   Oil (Fish Oil) .... Take Two Daily 13)  Hair, Skin and Nails .... Otc As  Directed.  Takes One Daily 14)  Valacyclovir Hcl 1 Gm Tabs (Valacyclovir Hcl) .... 2 Tab By Mouth Two Times A Day X 1 Day  Allergies: 1)  Codeine Phosphate (Codeine Phosphate)  Review of Systems      See HPI General:  Denies chills and fever. ENT:  Denies difficulty swallowing.  Physical Exam  General:  Well-developed,well-nourished,in no acute distress; alert,appropriate and cooperative throughout examination Mouth:  erythematous lesion inner bottom lip, not at vermillion border, no blister Psych:  Cognition and judgment appear intact. Alert and cooperative with normal attention span and concentration. No apparent delusions, illusions, hallucinations   Impression & Recommendations:  Problem # 1:  ORAL ULCER (ICD-528.9) Assessment Deteriorated Explained that this will likely be a chronic issue for her, especially at times of physical and emotional stress. Refilled Valtrex.  Complete Medication List: 1)  Benazepril Hcl 10 Mg Tabs (Benazepril hcl) .... Take 1 tablet by mouth once a day 2)  Lipitor 20 Mg Tabs (Atorvastatin calcium) .... Take 1 tablet by mouth once a day 3)  Adult Aspirin Low Strength 81 Mg Tbdp (Aspirin) .... Takes 2 by mouth every morning 4)  Alprazolam 0.25 Mg Tabs (Alprazolam) .... 1/2 -1 q 6 hours as  needed as directed 5)  Citracal + D 250-200 Mg-unit Tabs (Calcium citrate-vitamin d) .... As before 6)  Cvs Iron 325 (65 Fe) Mg Tabs (Ferrous sulfate) .... One tab by mouth once daily 7)  Clarinex 5 Mg Tabs (Desloratadine) .... Take 1 tablet by mouth once a day as needed itch 8)  Levothroid 100 Mcg Tabs (Levothyroxine sodium) .Marland Kitchen.. 1 once daily for thyroid 9)  Vitamin B-12 500 Mcg Tabs (Cyanocobalamin) .... One daily 10)  Vitamin D 1000 Unit Tabs (Cholecalciferol) .... Take two daily 11)  Multivitamins Tabs (Multiple vitamin) .... Take one daily 12)  Fish Oil Oil (Fish oil) .... Take two daily 13)  Hair, Skin and Nails  .... Otc as directed.  takes one daily 14)   Valacyclovir Hcl 1 Gm Tabs (Valacyclovir hcl) .... 2 tab by mouth two times a day x 1 day Prescriptions: VALACYCLOVIR HCL 1 GM TABS (VALACYCLOVIR HCL) 2 tab by mouth two times a day x 1 day  #30 x 0   Entered and Authorized by:   Ruthe Mannan MD   Signed by:   Ruthe Mannan MD on 01/02/2010   Method used:   Electronically to        [facility]* (retail)       [address]       [place], [place]  [address]       Ph: [phone]       Fax: [phone]   RxID:   [id number]   Current Allergies (reviewed today): CODEINE PHOSPHATE (CODEINE PHOSPHATE)

## 2010-11-05 NOTE — Progress Notes (Signed)
Summary: Rx Alprazolam  Phone Note Refill Request Call back at [phone] Message from:  [facility] on July 02, 2010 11:00 AM  Refills Requested: Medication #1:  ALPRAZOLAM 0.25 MG  TABS 1/2 -1 q 6 hours as needed as directed   Last Refilled: 03/06/2010  Method Requested: Telephone to Pharmacy Initial call taken by: Sydell Axon LPN,  July 02, 2010 11:01 AM  Follow-up for Phone Call        Rx called to pharmacy Follow-up by: Linde Gillis CMA Duncan Dull),  July 02, 2010 11:30 AM    Prescriptions: ALPRAZOLAM 0.25 MG  TABS (ALPRAZOLAM) 1/2 -1 q 6 hours as needed as directed  #30 x 1   Entered and Authorized by:   Ruthe Mannan MD   Signed by:   Ruthe Mannan MD on 07/02/2010   Method used:   Telephoned to ...       [facility]* (retail)       [address]       [place], [place]  [address]       Ph: [phone]       Fax: [phone]   RxID:   [id number]

## 2010-11-05 NOTE — Progress Notes (Signed)
Summary: refill request for valcyclovir  Phone Note Refill Request Message from:  Fax from Pharmacy  Refills Requested: Medication #1:  VALACYCLOVIR HCL 1 GM TABS 2 tab by mouth two times a day x 1 day.   Last Refilled: 03/20/2010 Faxed request from [facility].  Initial call taken by: Lowella Petties CMA,  April 11, 2010 8:42 AM  Follow-up for Phone Call        this was just refilled for 30 pills in mid june and inst are to take just 2 pills when needed - does she really need refil already? Follow-up by: Judith Part MD,  April 11, 2010 12:14 PM  Additional Follow-up for Phone Call Additional follow up Details #1::        Left message for patient to call back. spoke with casie at [facility] and she was not sure why pt needed med so soon. Lewanda Rife LPN  April 11, 4741 12:28 PM    Pt says she didnt [place] up all [place] thanks-- that clarifies things px written on EMR for call in  Additional Follow-up by: Lowella Petties CMA,  April 11, 2010 4:44 PM    Additional Follow-up for Phone Call Additional follow up Details #2::    Medication phoned to [facility] pharmacy as instructed. Lewanda Rife LPN  April 13, 5955 8:24 AM   Prescriptions: VALACYCLOVIR HCL 1 GM TABS (VALACYCLOVIR HCL) 2 tab by mouth two times a day x 1 day  #30 x 0   Entered and Authorized by:   Judith Part MD   Signed by:   Lewanda Rife LPN on 38/75/6433   Method used:   Telephoned to ...       [facility]* (retail)       [address]       [place], [place]  [address]       Ph: [phone]       Fax: [phone]   RxID:   [id number]

## 2010-11-05 NOTE — Procedures (Signed)
Summary: Colonoscopy  Patient: Carla Lyons Note: All result statuses are Final unless otherwise noted.  Tests: (1) Colonoscopy (COL)   COL Colonoscopy           DONE     [facility]     [address]     [place], [place]  [address]           COLONOSCOPY PROCEDURE REPORT           PATIENT:  Carla Lyons, Carla Lyons  MR#:  [id number]     BIRTHDATE:  04-25-1941, 69 yrs. old  GENDER:  female     ENDOSCOPIST:  Iva Boop, MD, [facility]           PROCEDURE DATE:  07/19/2010     PROCEDURE:  Colonoscopy with snare polypectomy     ASA CLASS:  Class II     INDICATIONS:  surveillance and high-risk screening, history of     pre-cancerous (adenomatous) colon polyps 2008: 7 mm adenoma     removed from rectum, 4 other polyps all hyperplastic but one in     cecum and 12 mm     MEDICATIONS:   Fentanyl 50 mcg IV, Versed 6 mg IV           DESCRIPTION OF PROCEDURE:   After the risks benefits and     alternatives of the procedure were thoroughly explained, informed     consent was obtained.  Digital rectal exam was performed and     revealed no abnormalities.   The LB [id number] endoscope     was introduced through the anus and advanced to the cecum, which     was identified by both the appendix and ileocecal valve, without     limitations.  The quality of the prep was excellent, using     MoviPrep.  The instrument was then slowly withdrawn as the colon     was fully examined. Insertion: 2:54 minutes withdrawal: 13:29     minutes     <<PROCEDUREIMAGES>>           FINDINGS:  A sessile polyp was found at the hepatic flexure. It     was 8 - 10 mm in size. Polyps were snared without cautery.     Retrieval was successful. snare polyp  Mild diverticulosis was     found in the sigmoid colon.  This was otherwise a normal     examination of the colon.   Retroflexed views in the rectum     revealed no abnormalities.    The scope was then withdrawn from     the patient and the procedure completed.         COMPLICATIONS:  None     ENDOSCOPIC IMPRESSION:     1) 8 - 10 mm sessile polyp at the hepatic flexure - removed     2) Mild diverticulosis in the sigmoid colon     3) Otherwise normal examination, excellent prep     4) personal history of 7mm rectal adenoma and 4 hyperplastic     polyps. one 12 mm cecal - 2008           REPEAT EXAM:  In for Colonoscopy, pending biopsy results.           Iva Boop, MD, [facility]           CC:  The Patient           n.     eSIGNED:  Iva Boop at 07/19/2010 09:09 AM           Carla Lyons, [id number]  Note: An exclamation mark (!) indicates a result that was not dispersed into the flowsheet. Document Creation Date: 07/19/2010 9:10 AM _______________________________________________________________________  (1) Order result status: Final Collection or observation date-time: 07/19/2010 09:01 Requested date-time:  Receipt date-time:  Reported date-time:  Referring Physician:   Ordering Physician: Stan Head [phone]) Specimen Source:  Source: Launa Grill Order Number: [id number] Lab site:   Appended Document: Colonoscopy: 8-10 mm serrated adenoma at hepatic flexure   Colonoscopy  Procedure date:  07/19/2010  Findings:         1) 8 - 10 mm sessile polyp at the hepatic flexure - removed SERRATED ADENOMA     2) Mild diverticulosis in the sigmoid colon     3) Otherwise normal examination, excellent prep     4) personal history of 7mm rectal adenoma and 4 hyperplastic     polyps. one 12 mm cecal - 2008  Procedures Next Due Date:    Colonoscopy: 07/2013   Appended Document: Colonoscopy     Procedures Next Due Date:    Colonoscopy: 07/2013

## 2010-11-05 NOTE — Letter (Signed)
Summary: Patient Notice- Polyp Results  [facility]  [address]   [place], [place] [address]   Phone: [phone]  Fax: [phone]        July 27, 2010 MRN: [id number]    [facility] [address] [place], [place]  [address]    Dear Ms. Whitner,  The polyp removed from your colon was adenomatous. This means that it was pre-cancerous or that  it had the potential to change into cancer over time.   I recommend that you have a repeat colonoscopy in 3 years to determine if you have developed any new polyps over time and to screen for colorectal cancer. If you develop any new rectal bleeding, abdominal pain or significant bowel habit changes, please contact us before then.  In addition to repeating colonoscopy, changing health habits may reduce your risk of having more colon polyps and possibly, colon cancer. You may lower your risk of future polyps and colon cancer by adopting healthy habits such as not smoking or using tobacco (if you do), being physically active, losing weight (if overweight), and eating a diet which includes fruits and vegetables and limits red meat.  Please call us if you are having persistent problems or have questions about your condition that have not been fully answered at this time.   Sincerely,  Iva Boop MD, [facility]  This letter has been electronically signed by your physician.  Appended Document: Patient Notice- Polyp Results letter mailed

## 2010-11-05 NOTE — Assessment & Plan Note (Signed)
Summary: F/U AFTER LABS / LFW   Vital Signs:  Patient profile:   70 year old female Height:      62.5 inches Weight:      146.25 pounds BMI:     26.42 Temp:     98.6 degrees F oral Pulse rate:   60 / minute Pulse rhythm:   regular BP sitting:   136 / 72  (left arm) Cuff size:   regular  Vitals Entered By: Lewanda Rife LPN (October 09, 2009 2:51 PM)  CC:  follow up after labs.  History of Present Illness: Here for follow up of recent labs --taking lipitor 20 without side effects --tolerating benazepril for HBP without side effects -- taking levothroid for hypothyroidism  Has been refused x 3 at [facility] when tried to give, has been taking FeSO4 325 1  daily for 1 yr  Will be due for colonoscopy 06/06/2010--last one done 06/15/2007  Unsure of last DEXA--will get paper chart   Problems Prior to Update: 1)  Special Screening Malig Neoplasms Other Sites  (ICD-V76.49) 2)  Postmenopausal Status  (ICD-V49.81) 3)  Back Pain, Lumbar  (ICD-724.2) 4)  Laryngitis, Acute  (ICD-464.00) 5)  Insect Bite  (ICD-919.4) 6)  Foot Pain, Right  (ICD-729.5) 7)  Viral Infection  (ICD-079.99) 8)  Wound, Open  (ICD-879.8) 9)  Hyperlipidemia, Mixed  (ICD-272.2) 10)  Esophageal Reflux Esp. After Caffeine  (ICD-530.81) 11)  Osteoporosis  (ICD-733.00) 12)  Hypertension  (ICD-401.9) 13)  Cellulitis/abscess, Site Nec  (ICD-682.8) 14)  Anemia Nos  (ICD-285.9) 15)  Chest Discomfort  (ICD-786.59) 16)  Hypothyroidism  (ICD-244.9) 17)  Depression  (ICD-311) 18)  Bradycardia  (ICD-427.89)  Medications Prior to Update: 1)  Benazepril Hcl 10 Mg Tabs (Benazepril Hcl) .... Take 1 Tablet By Mouth Once A Day 2)  Lipitor 20 Mg Tabs (Atorvastatin Calcium) .... Take 1 Tablet By Mouth Once A Day 3)  Adult Aspirin Low Strength 81 Mg  Tbdp (Aspirin) .... Takes 2 By Mouth Every Morning 4)  Alprazolam 0.25 Mg  Tabs (Alprazolam) .... 1/2 -1 Q 6 Hours As Needed As Directed 5)  Citracal + D 250-200 Mg-Unit  Tabs (Calcium  Citrate-Vitamin D) .... As Before 6)  Cvs Iron 325 (65 Fe) Mg  Tabs (Ferrous Sulfate) .... One Tab By Mouth Once Daily For One Week Then Twice A Day. 7)  Clarinex 5 Mg  Tabs (Desloratadine) .... Take 1 Tablet By Mouth Once A Day As Needed Itch 8)  Darvocet-N 100 100-650 Mg Tabs (Propoxyphene N-Apap) .Marland Kitchen.. 1 Every 6 Hrs As Needed Back Pain 9)  Levothroid 100 Mcg Tabs (Levothyroxine Sodium) .Marland Kitchen.. 1 Once Daily For Thyroid  Allergies: 1)  Codeine Phosphate (Codeine Phosphate)  Past History:  Past Surgical History: Last updated: 10-13-2007 Transthroracic echo---05/18/2007--neg Holter monitor--8/08--neg colonoscopy--06/15/07--polyps Hysterectomy, partial - age 17 Appendectomy C-Section x 2 Abd. U/S - fatty liver 05/03 Head CT with and without wnl 08/23/02 DEXA 08/01/05  Family History: Last updated: 2007-10-13 Father: Died at age 19, AAA surg - Embolus, osteoarthritis Mother: Alive, bypass surgery (13) - CAD Siblings: 2 brothers, one with arthritis and elevated BP, one L & W.                1sister L & W., one deceased at age 21 with lung disease.    MGF- died at age 79 or 56 with MI MGM died at age 99 - old age CV - none HBP - none DM - none No cancer  ETOH/Drug Abuse - none  Social History: Last updated: 08/30/2008 Marital Status:widow--husband died 12/21/2004 Children: 2 Occupation: retired   Risk Factors: Caffeine Use: 1 (08/30/2008) Exercise: no (08/30/2008)  Risk Factors: Smoking Status: quit (09/15/2007) Passive Smoke Exposure: no (08/30/2008)  Past Medical History: Reviewed history from 09/15/2007 and no changes required. colon polyps--06/15/07 Hypertension Osteoporosis  Review of Systems CV:  Denies chest pain or discomfort, palpitations, swelling of feet, and swelling of hands. Resp:  Denies cough, shortness of breath, and wheezing. GI:  Denies bloody stools, constipation, diarrhea, nausea, and vomiting. MS:  Complains of stiffness. Psych:  Complains of  anxiety and depression; reports needs 1/2 alprazolam at times when becomes anxious doing better with grief of death of husband with increased caring for new grandson and his brothers--live in [place], she goes there on Monday and stays over to help get them off to school as parents have meedtings on Monday night--feels needed.  Physical Exam  General:  alert, well-developed, well-nourished, and well-hydrated.   Neck:  no thyromegaly, no JVD, and no carotid bruits.   Lungs:  normal respiratory effort, no intercostal retractions, no accessory muscle use, and normal breath sounds.   Heart:  normal rate, regular rhythm, and no murmur.   Extremities:  no edema either lower leg at 3:30pm Neurologic:  alert & oriented X3, sensation intact to light touch, and gait normal.   Psych:  normally interactive and slightly anxious.     Impression & Recommendations:  Problem # 1:  ANEMIA NOS (ICD-285.9) Assessment New will get CBC and iron studies--tx if needed discussed iron rich foods Her updated medication list for this problem includes:    Cvs Iron 325 (65 Fe) Mg Tabs (Ferrous sulfate) ..... One tab by mouth once daily    Vitamin B-12 500 Mcg Tabs (Cyanocobalamin) ..... One daily  Orders: Venipuncture ([address]98) TLB-CBC Platelet - w/Differential (85025-CBCD) TLB-IBC Pnl (Iron/FE;Transferrin) (83550-IBC) TLB-B12 + Folate Pnl (82746_82607-B12/FOL)  Problem # 2:  OSTEOPOROSIS (ICD-733.00) Assessment: Comment Only last DEXA 8/06--will schedule vit D level at bottom of nl--to take 2000mg  daily of vit D during winter and 1000mg  if in sun almost daily in summer Orders: Radiology Referral (Radiology)  Problem # 3:  HYPERLIPIDEMIA, MIXED (ICD-272.2) Assessment: Unchanged stable on current dose of lipitor--continue recheck in 66mo or as needed  Her updated medication list for this problem includes:    Lipitor 20 Mg Tabs (Atorvastatin calcium) .Marland Kitchen... Take 1 tablet by mouth once a day  Labs  Reviewed: SGOT: 24 (10/03/2009)   SGPT: 23 (10/03/2009)   HDL:53.40 (10/03/2009), 56.6 (08/30/2008)  LDL:93 (10/03/2009), 108 (11/91/4782)  Chol:164 (10/03/2009), 192 (08/30/2008)  Trig:87.0 (10/03/2009), 137 (08/30/2008)  Problem # 4:  HYPERTENSION (ICD-401.9) Assessment: Unchanged stable on current dose of benazepril--continue see backin 6 mo or as needed  Her updated medication list for this problem includes:    Benazepril Hcl 10 Mg Tabs (Benazepril hcl) .Marland Kitchen... Take 1 tablet by mouth once a day  BP today: 136/72 Prior BP: 140/70 (11/03/2008)  Labs Reviewed: K+: 5.1 (10/03/2009) Creat: : 1.2 (10/03/2009)   Chol: 164 (10/03/2009)   HDL: 53.40 (10/03/2009)   LDL: 93 (10/03/2009)   TG: 87.0 (10/03/2009)  Problem # 5:  HYPOTHYROIDISM (ICD-244.9) Assessment: Unchanged stable on current dose of levothroid--continue, recheck 6-71mo Her updated medication list for this problem includes:    Levothroid 100 Mcg Tabs (Levothyroxine sodium) .Marland Kitchen... 1 once daily for thyroid  Labs Reviewed: TSH: 1.21 (10/03/2009)    Chol: 164 (10/03/2009)   HDL: 53.40 (10/03/2009)  LDL: 93 (10/03/2009)   TG: 87.0 (10/03/2009)  Complete Medication List: 1)  Benazepril Hcl 10 Mg Tabs (Benazepril hcl) .... Take 1 tablet by mouth once a day 2)  Lipitor 20 Mg Tabs (Atorvastatin calcium) .... Take 1 tablet by mouth once a day 3)  Adult Aspirin Low Strength 81 Mg Tbdp (Aspirin) .... Takes 2 by mouth every morning 4)  Alprazolam 0.25 Mg Tabs (Alprazolam) .... 1/2 -1 q 6 hours as needed as directed 5)  Citracal + D 250-200 Mg-unit Tabs (Calcium citrate-vitamin d) .... As before 6)  Cvs Iron 325 (65 Fe) Mg Tabs (Ferrous sulfate) .... One tab by mouth once daily 7)  Clarinex 5 Mg Tabs (Desloratadine) .... Take 1 tablet by mouth once a day as needed itch 8)  Levothroid 100 Mcg Tabs (Levothyroxine sodium) .Marland Kitchen.. 1 once daily for thyroid 9)  Vitamin B-12 500 Mcg Tabs (Cyanocobalamin) .... One daily 10)  Vitamin D 1000 Unit  Tabs (Cholecalciferol) .... Take two daily 11)  Multivitamins Tabs (Multiple vitamin) .... Take one daily 12)  Fish Oil Oil (Fish oil) .... Take two daily 13)  Hair, Skin and Nails  .... Otc as directed.  takes one daily  Patient Instructions: 1)  refer for DEXA--[facility] 2)  Please schedule a follow-up appointment in 6 months--Dr Dayton Martes. Prescriptions: LEVOTHROID 100 MCG TABS (LEVOTHYROXINE SODIUM) 1 once daily for thyroid  #30 x 12   Entered and Authorized by:   Gildardo Griffes FNP   Signed by:   Gildardo Griffes FNP on 10/12/2009   Method used:   Telephoned to ...       [facility]* (retail)       [address]       [place], [place]  [address]       Ph: [phone]       Fax: [phone]   RxID:   [id number] ALPRAZOLAM 0.25 MG  TABS (ALPRAZOLAM) 1/2 -1 q 6 hours as needed as directed  #30 x 1   Entered and Authorized by:   Gildardo Griffes FNP   Signed by:   Gildardo Griffes FNP on 10/12/2009   Method used:   Telephoned to ...       [facility]* (retail)       [address]       [place], [place]  [address]       Ph: [phone]       Fax: [phone]   RxID:   [id number] BENAZEPRIL HCL 10 MG TABS (BENAZEPRIL HCL) Take 1 tablet by mouth once a day  #30 x 12   Entered and Authorized by:   Gildardo Griffes FNP   Signed by:   Gildardo Griffes FNP on 10/12/2009   Method used:   Telephoned to ...       [facility]* (retail)       [address]       [place], [place]  [address]       Ph: [phone]       Fax: [phone]   RxID:   [id number] LIPITOR 20 MG TABS (ATORVASTATIN CALCIUM) Take 1 tablet by mouth once a day  #30 x 12   Entered and Authorized by:   Gildardo Griffes FNP   Signed by:   Gildardo Griffes FNP on 10/12/2009   Method used:   Telephoned to ...       [facility]* (retail)       [address]  [place], [place]  [address]       Ph: [phone]       Fax:  [phone]   RxID:   [id number]   Current Allergies (reviewed today): CODEINE PHOSPHATE (CODEINE PHOSPHATE)   Preventive Care Screening  Bone Density:    Date:  06/04/2005    Results:  abnormal std dev

## 2010-11-05 NOTE — Progress Notes (Signed)
Summary: wants refill on valacyclovir  Phone Note Refill Request Call back at Home Phone [phone] Message from:  Patient  Refills Requested: Medication #1:  VALACYCLOVIR HCL 1 GM TABS 2 tab by mouth two times a day x 1 day. Pt was given this on2/16 for blisters in her mouth.  She is getting these blisters again and is asking if a refill can be called to [facility].  Initial call taken by: Lowella Petties CMA,  December 06, 2009 8:32 AM  Follow-up for Phone Call        done Follow-up by: Hannah Beat MD,  December 06, 2009 8:58 AM    Prescriptions: VALACYCLOVIR HCL 1 GM TABS (VALACYCLOVIR HCL) 2 tab by mouth two times a day x 1 day  #4 x 0   Entered and Authorized by:   Hannah Beat MD   Signed by:   Hannah Beat MD on 12/06/2009   Method used:   Electronically to        [facility]* (retail)       [address]       [place], [place]  [address]       Ph: [phone]       Fax: [phone]   RxID:   [id number]

## 2010-11-05 NOTE — Progress Notes (Signed)
Summary: refill request for valcyclovir  Phone Note Refill Request Message from:  Fax from Pharmacy  Refills Requested: Medication #1:  VALACYCLOVIR HCL 1 GM TABS 2 tab by mouth two times a day x 1 day.   Last Refilled: 02/13/2010 Faxed request from [facility], [phone].  Initial call taken by: Lowella Petties CMA,  March 20, 2010 9:53 AM    Prescriptions: VALACYCLOVIR HCL 1 GM TABS (VALACYCLOVIR HCL) 2 tab by mouth two times a day x 1 day  #30 x 0   Entered and Authorized by:   Ruthe Mannan MD   Signed by:   Ruthe Mannan MD on 03/20/2010   Method used:   Electronically to        [facility]* (retail)       [address]       [place], [place]  [address]       Ph: [phone]       Fax: [phone]   RxID:   [id number]

## 2010-11-05 NOTE — Letter (Signed)
Summary: Results Follow up Letter  [facility]  [address] [place], [place] [address]   Phone: [phone]  Fax: [phone]    08/06/2010 MRN: [id number]  [facility] [address] [place], [place]  [address]  Dear Ms. Remigio,  The following are the results of your recent test(s):  Test         Result    Pap Smear:        Normal _____  Not Normal _____ Comments: ______________________________________________________ Cholesterol: LDL(Bad cholesterol):         Your goal is less than:         HDL (Good cholesterol):       Your goal is more than: Comments:  ______________________________________________________ Mammogram:        Normal __X___  Not Normal _____ Comments:  ___________________________________________________________________ Hemoccult:        Normal _____  Not normal _______ Comments:    _____________________________________________________________________ Other Tests:    We routinely do not discuss normal results over the telephone.  If you desire a copy of the results, or you have any questions about this information we can discuss them at your next office visit.   Sincerely,      Linde Gillis, CMA (AAMA)for Dr. Ruthe Mannan

## 2010-11-05 NOTE — Letter (Signed)
Summary: Results Follow up Letter   at [facility]  [address] [place], [place] [address]   Phone: [phone]  Fax: [phone]    10/29/2009 MRN: [id number]     [facility] [address] [place], [place]  [address]    Dear Ms. Smart,  The following are the results of your recent test(s):  Test         Result    Pap Smear:        Normal _____  Not Normal _____ Comments: ______________________________________________________ Cholesterol: LDL(Bad cholesterol):         Your goal is less than:         HDL (Good cholesterol):       Your goal is more than: Comments:  ______________________________________________________ Mammogram:        Normal _____  Not Normal _____ Comments:  ___________________________________________________________________ Hemoccult:        Normal __x___  Not normal _______ Comments:  Repeat in 1 year   _____________________________________________________________________ Other Tests:    We routinely do not discuss normal results over the telephone.  If you desire a copy of the results, or you have any questions about this information we can discuss them at your next office visit.   Sincerely,   Laurita Quint MD

## 2010-11-05 NOTE — Letter (Signed)
Summary: [facility] Instructions  [facility]  [address] [place], [place] [address]   Phone: [phone]  Fax: [phone]       Carla Lyons    10/25/1940    MRN: [id number]        Procedure Day /Date: Friday 07-19-10     Arrival Time: 7:30 a.m.     Procedure Time: 8:30 a.m.     Location of Procedure:                    _x _  [facility] (4th Floor)   PREPARATION FOR COLONOSCOPY WITH MOVIPREP   Starting 5 days prior to your procedure 07-14-10 do not eat nuts, seeds, popcorn, corn, beans, peas,  salads, or any raw vegetables.  Do not take any fiber supplements (e.g. Metamucil, Citrucel, and Benefiber).  THE DAY BEFORE YOUR PROCEDURE         DATE:  07-18-10  DAY: Thursday  1.  Drink clear liquids the entire day-NO SOLID FOOD  2.  Do not drink anything colored red or purple.  Avoid juices with pulp.  No orange juice.  3.  Drink at least 64 oz. (8 glasses) of fluid/clear liquids during the day to prevent dehydration and help the prep work efficiently.  CLEAR LIQUIDS INCLUDE: Water Jello Ice Popsicles Tea (sugar ok, no milk/cream) Powdered fruit flavored drinks Coffee (sugar ok, no milk/cream) Gatorade Juice: apple, white grape, white cranberry  Lemonade Clear bullion, consomm, broth Carbonated beverages (any kind) Strained chicken noodle soup Hard Candy                             4.  In the morning, mix first dose of MoviPrep solution:    Empty 1 Pouch A and 1 Pouch B into the disposable container    Add lukewarm drinking water to the top line of the container. Mix to dissolve    Refrigerate (mixed solution should be used within 24 hrs)  5.  Begin drinking the prep at 5:00 p.m. The MoviPrep container is divided by 4 marks.   Every 15 minutes drink the solution down to the next mark (approximately 8 oz) until the full liter is complete.   6.  Follow completed prep with 16 oz of clear liquid of your choice (Nothing red or purple).   Continue to drink clear liquids until bedtime.  7.  Before going to bed, mix second dose of MoviPrep solution:    Empty 1 Pouch A and 1 Pouch B into the disposable container    Add lukewarm drinking water to the top line of the container. Mix to dissolve    Refrigerate  THE DAY OF YOUR PROCEDURE      DATE:  07-19-10  DAY:  Friday  Beginning at  3:30 a.m. (5 hours before procedure):         1. Every 15 minutes, drink the solution down to the next mark (approx 8 oz) until the full liter is complete.  2. Follow completed prep with 16 oz. of clear liquid of your choice.    3. You may drink clear liquids until  6:30 a.m.  (2 HOURS BEFORE PROCEDURE).   MEDICATION INSTRUCTIONS  Unless otherwise instructed, you should take regular prescription medications with a small sip of water   as early as possible the morning of your procedure.           OTHER INSTRUCTIONS  You will need a responsible adult at least 69 years of age to accompany you and drive you home.   This person must remain in the waiting room during your procedure.  Wear loose fitting clothing that is easily removed.  Leave jewelry and other valuables at home.  However, you may wish to bring a book to read or  an iPod/MP3 player to listen to music as you wait for your procedure to start.  Remove all body piercing jewelry and leave at home.  Total time from sign-in until discharge is approximately 2-3 hours.  You should go home directly after your procedure and rest.  You can resume normal activities the  day after your procedure.  The day of your procedure you should not:   Drive   Make legal decisions   Operate machinery   Drink alcohol   Return to work  You will receive specific instructions about eating, activities and medications before you leave.    The above instructions have been reviewed and explained to me by   Wyona Almas RN  July 03, 2010 11:35 AM     I fully understand and  can verbalize these instructions _____________________________ Date _________

## 2010-11-05 NOTE — Progress Notes (Signed)
Summary: refill request for valacyclovir  Phone Note Refill Request Message from:  Fax from Pharmacy  Refills Requested: Medication #1:  VALACYCLOVIR HCL 1 GM TABS 2 tab by mouth two times a day x 1 day.   Last Refilled: 01/24/2010 Faxed request from [place] [facility].  Initial call taken by: Lowella Petties CMA,  Feb 13, 2010 4:32 PM    Prescriptions: VALACYCLOVIR HCL 1 GM TABS (VALACYCLOVIR HCL) 2 tab by mouth two times a day x 1 day  #30 x 0   Entered and Authorized by:   Ruthe Mannan MD   Signed by:   Ruthe Mannan MD on 02/13/2010   Method used:   Electronically to        [facility]* (retail)       [address]       [place], [place]  [address]       Ph: [phone]       Fax: [phone]   RxID:   [id number]

## 2010-11-05 NOTE — Progress Notes (Signed)
Summary: Alprazolam  Phone Note Refill Request Message from:  Fax from Pharmacy on March 06, 2010 11:52 AM  Refills Requested: Medication #1:  ALPRAZOLAM 0.25 MG  TABS 1/2 -1 q 6 hours as needed as directed [facility]  Phone:   [phone]   Method Requested: Telephone to Pharmacy Initial call taken by: Delilah Shan CMA [facility]),  March 06, 2010 11:53 AM  Follow-up for Phone Call        Rx called to pharmacy Follow-up by: Linde Gillis CMA [facility] Dull),  March 06, 2010 12:03 PM    Prescriptions: ALPRAZOLAM 0.25 MG  TABS (ALPRAZOLAM) 1/2 -1 q 6 hours as needed as directed  #30 x 1   Entered and Authorized by:   Ruthe Mannan MD   Signed by:   Ruthe Mannan MD on 03/06/2010   Method used:   Telephoned to ...       [facility]* (retail)       [address]       [place], [place]  [address]       Ph: [phone]       Fax: [phone]   RxID:   [id number]

## 2010-11-07 NOTE — Assessment & Plan Note (Signed)
Summary: SORES IN FOUST   Vital Signs:  Patient profile:   70 year old female Weight:      143 pounds Temp:     98.1 degrees F oral Pulse rate:   56 / minute Pulse rhythm:   regular BP sitting:   140 / 82  (left arm) Cuff size:   regular  Vitals Entered By: Selena Batten Dance CMA (AAMA) (September 25, 2010 9:02 AM) CC: Check sores in mouth   History of Present Illness: CC: check soreness in mouth  2 d h/o soreness in mouth along with burning and tingling.  Left side of mouth.  Took all of med from Dr Dayton Martes (thinks was acyclovir).  has had this before.  looked like large canker sore this time.  did eat spicy food and increased holiday stress recently.  No other spots anywhere, no fevers/chills.    Current Medications (verified): 1)  Benazepril Hcl 10 Mg Tabs (Benazepril Hcl) .... Take 1 Tablet By Mouth Once A Day 2)  Lipitor 20 Mg Tabs (Atorvastatin Calcium) .... Take 1 Tablet By Mouth Once A Day 3)  Adult Aspirin Low Strength 81 Mg  Tbdp (Aspirin) .... Takes 2 By Mouth Every Morning 4)  Alprazolam 0.25 Mg  Tabs (Alprazolam) .... 1/2 -1 Q 6 Hours As Needed As Directed 5)  Citracal + D 250-200 Mg-Unit  Tabs (Calcium Citrate-Vitamin D) .... As Before 6)  Cvs Iron 325 (65 Fe) Mg  Tabs (Ferrous Sulfate) .... One Tab By Mouth Once Daily 7)  Clarinex 5 Mg  Tabs (Desloratadine) .... Take 1 Tablet By Mouth Once A Day As Needed Itch 8)  Levothroid 100 Mcg Tabs (Levothyroxine Sodium) .Marland Kitchen.. 1 Once Daily For Thyroid 9)  Vitamin B-12 500 Mcg  Tabs (Cyanocobalamin) .... One Daily 10)  Vitamin D 1000 Unit  Tabs (Cholecalciferol) .... Take Two Daily 11)  Multivitamins   Tabs (Multiple Vitamin) .... Take One Daily 12)  Fish Oil   Oil (Fish Oil) .... Take Two Daily 13)  Hair, Skin and Nails .... Otc As Directed.  Takes One Daily  Allergies: 1)  Codeine Phosphate (Codeine Phosphate)  Past History:  Past Medical History: Last updated: 09/15/2007 colon polyps--06/15/07 Hypertension Osteoporosis  Social  History: Last updated: 08/30/2008 Marital Status:widow--husband died 01-05-2005 Children: 2 Occupation: retired   Review of Systems       per HPI  Physical Exam  General:  Well-developed,well-nourished,in no acute distress; alert,appropriate and cooperative throughout examination Mouth:  white sore left inner lip, slight erythema around lesion.     Impression & Recommendations:  Problem # 1:  ORAL ULCER (ICD-528.9) consistent with cold sore.  Refilled Valtrex.  pt aware to use at beginning of lesions, not to use more than 1 day of treatment at at time  Complete Medication List: 1)  Benazepril Hcl 10 Mg Tabs (Benazepril hcl) .... Take 1 tablet by mouth once a day 2)  Lipitor 20 Mg Tabs (Atorvastatin calcium) .... Take 1 tablet by mouth once a day 3)  Adult Aspirin Low Strength 81 Mg Tbdp (Aspirin) .... Takes 2 by mouth every morning 4)  Alprazolam 0.25 Mg Tabs (Alprazolam) .... 1/2 -1 q 6 hours as needed as directed 5)  Citracal + D 250-200 Mg-unit Tabs (Calcium citrate-vitamin d) .... As before 6)  Cvs Iron 325 (65 Fe) Mg Tabs (Ferrous sulfate) .... One tab by mouth once daily 7)  Clarinex 5 Mg Tabs (Desloratadine) .... Take 1 tablet by mouth once a day as needed itch  8)  Levothroid 100 Mcg Tabs (Levothyroxine sodium) .Marland Kitchen.. 1 once daily for thyroid 9)  Vitamin B-12 500 Mcg Tabs (Cyanocobalamin) .... One daily 10)  Vitamin D 1000 Unit Tabs (Cholecalciferol) .... Take two daily 11)  Multivitamins Tabs (Multiple vitamin) .... Take one daily 12)  Fish Oil Oil (Fish oil) .... Take two daily 13)  Hair, Skin and Nails  .... Otc as directed.  takes one daily 14)  Valtrex 1 Gm Tabs (Valacyclovir hcl) .... 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 2)  Watch food and stress.  This can make outbreaks happen. 3)  refilled valtrex.  take one day course when you feel an outbreak coming. Prescriptions: VALTREX 1 GM  TABS (VALACYCLOVIR HCL) take two twice daily x 1 days (separate doses by 12 hours)  #30 x 0   Entered and Authorized by:   Eustaquio Boyden  MD   Signed by:   Eustaquio Boyden  MD on 09/25/2010   Method used:   Electronically to        [facility]* (retail)       [address]       [place], [place]  [address]       Ph: [phone]       Fax: [phone]   RxID:   [id number]    Orders Added: 1)  Est. Patient Level III [84132]    Current Allergies (reviewed today): CODEINE PHOSPHATE (CODEINE PHOSPHATE)

## 2010-11-13 ENCOUNTER — Ambulatory Visit: Payer: Medicare Other | Admitting: Family Medicine

## 2010-11-13 ENCOUNTER — Encounter: Payer: Self-pay | Admitting: Family Medicine

## 2010-11-13 DIAGNOSIS — J309 Allergic rhinitis, unspecified: Secondary | ICD-10-CM | POA: Insufficient documentation

## 2010-11-13 DIAGNOSIS — J019 Acute sinusitis, unspecified: Secondary | ICD-10-CM

## 2010-11-14 ENCOUNTER — Ambulatory Visit: Payer: Self-pay | Admitting: Family Medicine

## 2010-11-21 NOTE — Assessment & Plan Note (Signed)
Summary: head and sinus problems/jbb   Vital Signs:  Patient Profile:   70 Years Old Female CC:      Cold & URI symptoms Height:     62 inches Weight:      148 pounds BMI:     27.17 O2 Sat:      98 % O2 treatment:    Room Air Temp:     98.4 degrees F oral Pulse rate:   76 / minute Pulse rhythm:   regular Resp:     18 per minute BP sitting:   137 / 88  (right arm)  Pt. in pain?   yes    Location:   head    Intensity:   5    Type:       aching  Vitals Entered By: Levonne Spiller EMT-P (November 13, 2010 2:56 PM)              Is Patient Diabetic? No  Does patient need assistance? Functional Status Self care Ambulation Normal      Current Allergies (reviewed today): CODEINE PHOSPHATE (CODEINE PHOSPHATE)History of Present Illness History from: patient Chief Complaint: Cold & URI symptoms History of Present Illness: The patient presented today because she was having symptoms of sinus drainage congestion for the past 2 weeks that started getting worse in the past 4 days. She reports that she's been having sinus headaches and coughing and sneezing with runny nose and reports that she came in because she was concerned about the progressive symptoms that she was experiencing with her sinuses. She reports that she was having postnasal sinus drainage as well. She has had a productive cough with colored sputum. She denies fever and chills. No vomiting diarrhea or rash.  She Did Report a Post Nasal Drainage with Sore Throat.  REVIEW OF SYSTEMS Constitutional Symptoms      Denies fever, chills, night sweats, weight loss, weight gain, and fatigue.  Eyes       Denies change in vision, eye pain, eye discharge, glasses, contact lenses, and eye surgery. Ear/Nose/Throat/Mouth       Complains of frequent runny nose, sinus problems, and sore throat.      Denies hearing loss/aids, change in hearing, ear pain, ear discharge, dizziness, frequent nose bleeds, hoarseness, and tooth pain or bleeding.   Respiratory       Complains of productive cough.      Denies dry cough, wheezing, shortness of breath, asthma, bronchitis, and emphysema/COPD.      Comments: Yellow colored sputum Cardiovascular       Denies murmurs, chest pain, and tires easily with exhertion.    Gastrointestinal       Denies stomach pain, nausea/vomiting, diarrhea, constipation, blood in bowel movements, and indigestion. Genitourniary       Denies painful urination, blood or discharge from vagina, kidney stones, and loss of urinary control. Neurological       Complains of headaches.      Denies paralysis, seizures, and fainting/blackouts. Musculoskeletal       Denies muscle pain, joint pain, joint stiffness, decreased range of motion, redness, swelling, muscle weakness, and gout.  Skin       Denies bruising, unusual mles/lumps or sores, and hair/skin or nail changes.  Psych       Denies mood changes, temper/anger issues, anxiety/stress, speech problems, depression, and sleep problems.  Past History:  Family History: Last updated: 10/06/2007 Father: Died at age 92, AAA surg - Embolus, osteoarthritis Mother: Alive, bypass surgery (  75) - CAD Siblings: 2 brothers, one with arthritis and elevated BP, one L & W.                1sister L & W., one deceased at age 62 with lung disease.    MGF- died at age 23 or 89 with MI MGM died at age 47 - old age CV - none HBP - none DM - none No cancer ETOH/Drug Abuse - none  Social History: Last updated: 11/13/2010 Marital Status:widow--husband died Dec 24, 2004 Children: 2 Occupation: retired  Never Smoked Alcohol use-no Drug use-no  Risk Factors: Caffeine Use: 1 (08/30/2008) Exercise: no (08/30/2008)  Risk Factors: Smoking Status: never (11/13/2010) Passive Smoke Exposure: no (08/30/2008)  Past Medical History: colon polyps--06/15/07 Hypertension Osteoporosis allergic rhinitis  Past Surgical History: Reviewed history from 09/15/2007 and no changes  required. Transthroracic echo---05/18/2007--neg Holter monitor--8/08--neg colonoscopy--06/15/07--polyps Hysterectomy, partial - age 27 Appendectomy C-Section x 2 Abd. U/S - fatty liver 05/03 Head CT with and without wnl 08/23/02 DEXA 08/01/05  Family History: Reviewed history from 09/15/2007 and no changes required. Father: Died at age 57, AAA surg - Embolus, osteoarthritis Mother: Alive, bypass surgery (21) - CAD Siblings: 2 brothers, one with arthritis and elevated BP, one L & W.                1sister L & W., one deceased at age 37 with lung disease.    MGF- died at age 27 or 77 with MI MGM died at age 58 - old age CV - none HBP - none DM - none No cancer ETOH/Drug Abuse - none  Social History: Marital Status:widow--husband died 12-24-2004 Children: 2 Occupation: retired  Never Smoked Alcohol use-no Drug use-no Smoking Status:  never Physical Exam General appearance: well developed, well nourished, no acute distress Head: normocephalic, atraumatic Eyes: conjunctivae and lids normal Pupils: equal, round, reactive to light Ears: normal, no lesions or deformities Nasal: swollen red turbinates with congestion Oral/Pharynx: dry mucous membranes, tongue normal, posterior pharynx without erythema or exudate Neck: neck supple,  trachea midline, no masses Chest/Lungs: no rales, wheezes, or rhonchi bilateral, breath sounds equal without effort Heart: regular rate and  rhythm, no murmur Abdomen: soft, non-tender without obvious organomegaly Extremities: normal extremities Neurological: grossly intact and non-focal Skin: no obvious rashes or lesions MSE: oriented to time, place, and person Assessment New Problems: ALLERGIC RHINITIS CAUSE UNSPECIFIED (ICD-477.9) ACUTE SINUSITIS, UNSPECIFIED (ICD-461.9)   Patient Education: Patient and/or caregiver instructed in the following: rest, fluids. The risks, benefits and possible side effects were clearly explained and discussed  with the patient.  The patient verbalized clear understanding.  The patient was given instructions to return if symptoms don't improve, worsen or new changes develop.  If it is not during clinic hours and the patient cannot get back to this clinic then the patient was told to seek medical care at an available urgent care or emergency department.  The patient verbalized understanding.   Demonstrates willingness to comply.  Plan New Medications/Changes: PREDNISONE 20 MG TABS (PREDNISONE) take 2 tabs by mouth x 1 dose  #2 x 0, 11/13/2010, Clanford Johnson MD AZITHROMYCIN 250 MG TABS (AZITHROMYCIN) take 2 tabs by mouth on day 1, then take 1 tab by mouth daily until completed  #6 x 0, 11/13/2010, Clanford Johnson MD FLUTICASONE PROPIONATE 50 MCG/ACT SUSP (FLUTICASONE PROPIONATE) 2 sprays per nostril once daily  #1 x 1, 11/13/2010, Clanford Johnson MD LORATADINE 10 MG TABS (LORATADINE) take 1 by mouth daily  #30 x  1, 11/13/2010, Standley Dakins MD  Follow Up: Follow up in 2-3 days if no improvement, Follow up on an as needed basis, Follow up with Primary Physician  The patient and/or caregiver has been counseled thoroughly with regard to medications prescribed including dosage, schedule, interactions, rationale for use, and possible side effects and they verbalize understanding.  Diagnoses and expected course of recovery discussed and will return if not improved as expected or if the condition worsens. Patient and/or caregiver verbalized understanding.  Prescriptions: PREDNISONE 20 MG TABS (PREDNISONE) take 2 tabs by mouth x 1 dose  #2 x 0   Entered and Authorized by:   Standley Dakins MD   Signed by:   Standley Dakins MD on 11/13/2010   Method used:   Electronically to        [facility]* (retail)       [address]       [place], [place]  [address]       Ph: [phone]       Fax: [phone]   RxID:   [id number] AZITHROMYCIN 250 MG TABS (AZITHROMYCIN) take 2 tabs by mouth on day 1,  then take 1 tab by mouth daily until completed  #6 x 0   Entered and Authorized by:   Standley Dakins MD   Signed by:   Standley Dakins MD on 11/13/2010   Method used:   Electronically to        [facility]* (retail)       [address]       [place], [place]  [address]       Ph: [phone]       Fax: [phone]   RxID:   [id number] FLUTICASONE PROPIONATE 50 MCG/ACT SUSP (FLUTICASONE PROPIONATE) 2 sprays per nostril once daily  #1 x 1   Entered and Authorized by:   Standley Dakins MD   Signed by:   Standley Dakins MD on 11/13/2010   Method used:   Electronically to        [facility]* (retail)       [address]       [place], [place]  [address]       Ph: [phone]       Fax: [phone]   RxID:   [id number] LORATADINE 10 MG TABS (LORATADINE) take 1 by mouth daily  #30 x 1   Entered and Authorized by:   Standley Dakins MD   Signed by:   Standley Dakins MD on 11/13/2010   Method used:   Electronically to        [facility]* (retail)       [address]       [place], [place]  [address]       Ph: [phone]       Fax: [phone]   RxID:   [id number]   Patient Instructions: 1)  Go to the pharmacy and pick up your prescription (s).  It may take up to 30 mins for electronic prescriptions to be delivered to the pharmacy.  Please call if your pharmacy has not received your prescriptions after 30 minutes.   2)  Take your antibiotic as prescribed until ALL of it is gone, but stop if you develop a rash or swelling and contact our office as soon as possible. 3)  Acute sinusitis symptoms for less than 10 days are not helped by antibiotics.Use warm moist compresses, and over the counter decongestants ( only as directed). Call if no improvement in 5-7 days, sooner if increasing  pain, fever, or new symptoms. 4)  Return or go to the ER if no improvement or symptoms getting worse.   5)  The patient's prescriptions were checked for possible interactions and  electronically sent to the pharmacy of choice.  6)  The patient was informed that there is no on-call provider or services available at this clinic during off-hours (when the clinic is closed).  If the patient developed a problem or concern that required immediate attention, the patient was advised to go the the nearest available urgent care or emergency department for medical care.  The patient verbalized understanding.

## 2010-12-03 ENCOUNTER — Other Ambulatory Visit: Payer: Self-pay | Admitting: Family Medicine

## 2010-12-03 ENCOUNTER — Telehealth (INDEPENDENT_AMBULATORY_CARE_PROVIDER_SITE_OTHER): Payer: Self-pay | Admitting: *Deleted

## 2010-12-03 ENCOUNTER — Encounter (INDEPENDENT_AMBULATORY_CARE_PROVIDER_SITE_OTHER): Payer: Self-pay | Admitting: *Deleted

## 2010-12-03 ENCOUNTER — Other Ambulatory Visit (INDEPENDENT_AMBULATORY_CARE_PROVIDER_SITE_OTHER): Payer: Medicare Other

## 2010-12-03 DIAGNOSIS — E782 Mixed hyperlipidemia: Secondary | ICD-10-CM

## 2010-12-03 DIAGNOSIS — I10 Essential (primary) hypertension: Secondary | ICD-10-CM

## 2010-12-03 DIAGNOSIS — E039 Hypothyroidism, unspecified: Secondary | ICD-10-CM

## 2010-12-03 LAB — BASIC METABOLIC PANEL
BUN: 21 mg/dL (ref 6–23)
CO2: 27 mEq/L (ref 19–32)
Calcium: 9.3 mg/dL (ref 8.4–10.5)
Chloride: 104 mEq/L (ref 96–112)
Creatinine, Ser: 1.2 mg/dL (ref 0.4–1.2)
GFR: 47.38 mL/min — ABNORMAL LOW (ref >60.00)
Glucose, Bld: 91 mg/dL (ref 70–99)
Potassium: 5.1 mEq/L (ref 3.5–5.1)
Sodium: 140 mEq/L (ref 135–145)

## 2010-12-03 LAB — HEPATIC FUNCTION PANEL
ALT: 15 U/L (ref 0–35)
AST: 19 U/L (ref 0–37)
Albumin: 3.9 g/dL (ref 3.5–5.2)
Alkaline Phosphatase: 57 U/L (ref 39–117)
Bilirubin, Direct: 0.1 mg/dL (ref 0.0–0.3)
Total Bilirubin: 0.9 mg/dL (ref 0.3–1.2)
Total Protein: 6.9 g/dL (ref 6.0–8.3)

## 2010-12-03 LAB — LIPID PANEL
Cholesterol: 165 mg/dL (ref 0–200)
HDL: 55.5 mg/dL (ref >39.00)
LDL Cholesterol: 91 mg/dL (ref 0–99)
Total CHOL/HDL Ratio: 3
Triglycerides: 91 mg/dL (ref 0.0–149.0)
VLDL: 18.2 mg/dL (ref 0.0–40.0)

## 2010-12-03 LAB — TSH: TSH: 1.48 u[IU]/mL (ref 0.35–5.50)

## 2010-12-05 ENCOUNTER — Encounter (INDEPENDENT_AMBULATORY_CARE_PROVIDER_SITE_OTHER): Payer: Medicare Other | Admitting: Family Medicine

## 2010-12-05 ENCOUNTER — Encounter: Payer: Self-pay | Admitting: Family Medicine

## 2010-12-05 DIAGNOSIS — Z0000 Encounter for general adult medical examination without abnormal findings: Secondary | ICD-10-CM

## 2010-12-05 LAB — HM SIGMOIDOSCOPY

## 2010-12-12 NOTE — Progress Notes (Signed)
----   Converted from flag ---- ---- 12/02/2010 2:02 PM, Ruthe Mannan MD wrote: TSH (244.9), BMET (401.9), fasting lipid panel, hepatic panel (272.4) Thanks!  ---- 12/02/2010 9:44 AM, Liane Comber CMA (AAMA) wrote: Lab orders please! Good Morning! This pt is scheduled for cpx labs Tuesday, which labs to draw and dx codes to use? Thanks Tasha ------------------------------

## 2010-12-12 NOTE — Assessment & Plan Note (Signed)
Summary: MEDICARE CPX/CLE  MEDICARE/UHC,MAILED MEDICARE FORM   Vital Signs:  Patient profile:   70 year old female Height:      62 inches Weight:      148.50 pounds BMI:     27.26 Temp:     98.1 degrees F oral Pulse rate:   79 / minute Pulse rhythm:   regular BP sitting:   140 / 90  (right arm) Cuff size:   regular  Vitals Entered By: Linde Gillis CMA Duncan Dull) (December 05, 2010 8:12 AM) CC: medicare annual wellness  Vision Screening:Left eye with correction: 20 / 20 Right eye with correction: 20 / 20 Both eyes with correction: 20 / 25  Color vision testing: normal      Vision Entered By: Linde Gillis CMA (AAMA) (December 05, 2010 8:21 AM)  Hearing Screen 40db HL: Left  500 hz: 40db 1000 hz: 40db 2000 hz: 40db 4000 hz: 40db Right  500 hz: 40db 1000 hz: 40db 2000 hz: 40db 4000 hz: 40db    History of Present Illness: 70 yo here for medicare annual wellness visit.  I have personally reviewed the Medicare Annual Wellness questionnaire and have noted 1.   The patient's medical and social history- labs reviewed with pt.  Cholesterol- stable on Lipitor 20 mg daily, LFTS within normal limits.  Denies any side effects. Hypothyroidism- denies any symptoms of hypo or hyper thyroidism.  TSH wnl, on Levothroid 100 mch daily.  2.   Their use of alcohol, tobacco or illicit drugs-none 3.   Their current medications and supplements 4.   The patient's functional ability including ADL's, fall risks, home safety risks and hearing or visual i mpairment- see geriatrics assessment. 5.   Diet and physical activities- very physically active, helps to care for her new grandchild. 6.   Evidence for depression or mood disorders-none, at times gets sad around anniversary of husband's death but she feels she is doing ok.  Stays very active and has a great support system.   Current Medications (verified): 1)  Benazepril Hcl 10 Mg Tabs (Benazepril Hcl) .... Take 1 Tablet By Mouth Once A Day 2)   Lipitor 20 Mg Tabs (Atorvastatin Calcium) .... Take 1 Tablet By Mouth Once A Day 3)  Adult Aspirin Low Strength 81 Mg  Tbdp (Aspirin) .... Takes 2 By Mouth Every Morning 4)  Alprazolam 0.25 Mg  Tabs (Alprazolam) .... 1/2 -1 Q 6 Hours As Needed As Directed 5)  Citracal + D 250-200 Mg-Unit  Tabs (Calcium Citrate-Vitamin D) .... As Before 6)  Cvs Iron 325 (65 Fe) Mg  Tabs (Ferrous Sulfate) .... One Tab By Mouth Once Daily 7)  Levothroid 100 Mcg Tabs (Levothyroxine Sodium) .Marland Kitchen.. 1 Once Daily For Thyroid 8)  Vitamin B-12 500 Mcg  Tabs (Cyanocobalamin) .... One Daily 9)  Vitamin D 1000 Unit  Tabs (Cholecalciferol) .... Take Two Daily 10)  Multivitamins   Tabs (Multiple Vitamin) .... Take One Daily 11)  Fish Oil   Oil (Fish Oil) .... Take Two Daily 12)  Hair, Skin and Nails .... Otc As Directed.  Takes One Daily 13)  Valtrex 1 Gm Tabs (Valacyclovir Hcl) .... Take Two Twice Daily X 1 Days (Separate Doses By 12 Hours) 14)  Loratadine 10 Mg Tabs (Loratadine) .... Take 1 By Mouth Daily 15)  Fluticasone Propionate 50 Mcg/act Susp (Fluticasone Propionate) .... 2 Sprays Per Nostril Once Daily  Allergies: 1)  Codeine Phosphate (Codeine Phosphate)  Past History:  Past Medical History: Last  updated: 11/13/2010 colon polyps--06/15/07 Hypertension Osteoporosis allergic rhinitis  Past Surgical History: Last updated: 10-10-2007 Transthroracic echo---05/18/2007--neg Holter monitor--8/08--neg colonoscopy--06/15/07--polyps Hysterectomy, partial - age 67 Appendectomy C-Section x 2 Abd. U/S - fatty liver 05/03 Head CT with and without wnl 08/23/02 DEXA 08/01/05  Family History: Last updated: 10-10-07 Father: Died at age 59, AAA surg - Embolus, osteoarthritis Mother: Alive, bypass surgery (44) - CAD Siblings: 2 brothers, one with arthritis and elevated BP, one L & W.                1sister L & W., one deceased at age 54 with lung disease.    MGF- died at age 63 or 50 with MI MGM died at age 55 -  old age CV - none HBP - none DM - none No cancer ETOH/Drug Abuse - none  Social History: Last updated: 11/13/2010 Marital Status:widow--husband died 01-09-05 Children: 2 Occupation: retired  Never Smoked Alcohol use-no Drug use-no  Risk Factors: Caffeine Use: 1 (08/30/2008) Exercise: no (08/30/2008)  Risk Factors: Smoking Status: never (11/13/2010) Passive Smoke Exposure: no (08/30/2008)  Review of Systems      See HPI General:  Denies malaise. Eyes:  Denies blurring. ENT:  Denies difficulty swallowing. CV:  Denies chest pain or discomfort. Resp:  Denies shortness of breath. GI:  Denies abdominal pain and change in bowel habits. GU:  Denies abnormal vaginal bleeding. MS:  Denies joint pain, joint redness, and joint swelling. Derm:  Denies rash. Neuro:  Denies headaches. Psych:  Denies anxiety and depression. Endo:  Denies cold intolerance, heat intolerance, and weight change. Heme:  Denies abnormal bruising and bleeding.  Physical Exam  General:  Well-developed,well-nourished,in no acute distress; alert,appropriate and cooperative throughout examination Head:  Normocephalic and atraumatic without obvious abnormalities. No apparent alopecia or balding. Mouth:  white sore left inner lip, slight erythema around lesion.   Neurologic:  alert & oriented X3 and gait normal.   Psych:  Cognition and judgment appear intact. Alert and cooperative with normal attention span and concentration. No apparent delusions, illusions, hallucinations   Impression & Recommendations:  Problem # 1:  Preventive Health Care (ICD-V70.0)  The patients weight, height, BMI and visual acuity have been recorded in the chart I have made referrals, counseling and provided education to the patient based review of the above and I have provided the pt with a written personalized care plan for preventive services.  MOST form filled out, scanned, and given to pt for her records.  Orders: Medicare  -1st Annual Wellness Visit [id number])  Problem # 2:  HYPERLIPIDEMIA, MIXED (ICD-272.2) Assessment: Unchanged stable, labs reviewed with Ms. Bowron in office today. Her updated medication list for this problem includes:    Lipitor 20 Mg Tabs (Atorvastatin calcium) .Marland Kitchen... Take 1 tablet by mouth once a day  Problem # 3:  HYPOTHYROIDISM (ICD-244.9) Assessment: Unchanged stable, labs reviewed with Ms. Duignan in office today. Her updated medication list for this problem includes:    Levothroid 100 Mcg Tabs (Levothyroxine sodium) .Marland Kitchen... 1 once daily for thyroid  Complete Medication List: 1)  Benazepril Hcl 10 Mg Tabs (Benazepril hcl) .... Take 1 tablet by mouth once a day 2)  Lipitor 20 Mg Tabs (Atorvastatin calcium) .... Take 1 tablet by mouth once a day 3)  Adult Aspirin Low Strength 81 Mg Tbdp (Aspirin) .... Takes 2 by mouth every morning 4)  Alprazolam 0.25 Mg Tabs (Alprazolam) .... 1/2 -1 q 6 hours as needed as directed 5)  Citracal + D 250-200  Mg-unit Tabs (Calcium citrate-vitamin d) .... As before 6)  Cvs Iron 325 (65 Fe) Mg Tabs (Ferrous sulfate) .... One tab by mouth once daily 7)  Levothroid 100 Mcg Tabs (Levothyroxine sodium) .Marland Kitchen.. 1 once daily for thyroid 8)  Vitamin B-12 500 Mcg Tabs (Cyanocobalamin) .... One daily 9)  Vitamin D 1000 Unit Tabs (Cholecalciferol) .... Take two daily 10)  Multivitamins Tabs (Multiple vitamin) .... Take one daily 11)  Fish Oil Oil (Fish oil) .... Take two daily 12)  Hair, Skin and Nails  .... Otc as directed.  takes one daily 13)  Valtrex 1 Gm Tabs (Valacyclovir hcl) .... Take two twice daily x 1 days (separate doses by 12 hours) 14)  Loratadine 10 Mg Tabs (Loratadine) .... Take 1 by mouth daily 15)  Fluticasone Propionate 50 Mcg/act Susp (Fluticasone propionate) .... 2 sprays per nostril once daily Prescriptions: ALPRAZOLAM 0.25 MG  TABS (ALPRAZOLAM) 1/2 -1 q 6 hours as needed as directed  #30 x 1   Entered and Authorized by:   Ruthe Mannan MD   Signed by:    Ruthe Mannan MD on 12/05/2010   Method used:   Print then Give to Patient   RxID:   [id number]    Orders Added: 1)  Medicare -1st Annual Wellness Visit [G0438]    Current Allergies (reviewed today): CODEINE PHOSPHATE (CODEINE PHOSPHATE)  Flex Sig Next Due:  Not Indicated Last Hemoccult Result: negative (10/26/2009 10:43:15 AM) Hemoccult Next Due:  Not Indicated Last PAP:  NEGATIVE FOR INTRAEPITHELIAL LESIONS OR MALIGNANCY. (11/03/2008 12:00:00 AM) PAP Next Due:  Not Indicated Last Mammogram:  normal (07/25/2009 10:24:04 AM) Mammogram Result Date:  08/02/2010 Mammogram Result:  normal   Geriatric Assessment:  Activities of Daily Living:    Bathing-independent    Dressing-independent    Eating-independent    Toileting-independent    Transferring-independent    Continence-independent  Instrumental Activities of Daily Living:    Transportation-independent    Meal/Food Preparation-independent    Shopping Errands-independent    Housekeeping/Chores-independent    Money Management/Finances-independent    Medication Management-independent    Ability to Use Telephone-independent    Laundry-independent

## 2010-12-12 NOTE — Letter (Signed)
Summary: Nature conservation officer [employer] Wellness Visit Questionnaire   [employer] Annual Wellness Visit Questionnaire   Imported By: Beau Fanny 12/05/2010 15:34:19  _____________________________________________________________________  External Attachment:    Type:   Image     Comment:   External Document

## 2010-12-17 NOTE — Miscellaneous (Signed)
Summary: Medical Orders for Scope of Treatment (MOST) Form  Medical Orders for Scope of Treatment (MOST) Form   Imported By: Lanelle Bal 12/09/2010 12:59:59  _____________________________________________________________________  External Attachment:    Type:   Image     Comment:   External Document

## 2010-12-25 LAB — CBC
HCT: 32.3 % — ABNORMAL LOW (ref 36.0–46.0)
Hemoglobin: 11.4 g/dL — ABNORMAL LOW (ref 12.0–15.0)
MCHC: 35.3 g/dL (ref 30.0–36.0)
MCV: 100.4 fL — ABNORMAL HIGH (ref 78.0–100.0)
Platelets: 190 10*3/uL (ref 150–400)
RBC: 3.21 MIL/uL — ABNORMAL LOW (ref 3.87–5.11)
RDW: 11.8 % (ref 11.5–15.5)
WBC: 8.6 10*3/uL (ref 4.0–10.5)

## 2010-12-25 LAB — POCT CARDIAC MARKERS
CKMB, poc: 1 ng/mL (ref 1.0–8.0)
CKMB, poc: <1 ng/mL — ABNORMAL LOW (ref 1.0–8.0)
Myoglobin, poc: 72.1 ng/mL (ref 12–200)
Myoglobin, poc: 86.5 ng/mL (ref 12–200)
Troponin i, poc: <0.05 ng/mL (ref 0.00–0.09)
Troponin i, poc: <0.05 ng/mL (ref 0.00–0.09)

## 2010-12-25 LAB — POCT I-STAT, CHEM 8
BUN: 27 mg/dL — ABNORMAL HIGH (ref 6–23)
Calcium, Ion: 1.12 mmol/L (ref 1.12–1.32)
Chloride: 109 mEq/L (ref 96–112)
Creatinine, Ser: 1.2 mg/dL (ref 0.4–1.2)
Glucose, Bld: 111 mg/dL — ABNORMAL HIGH (ref 70–99)
HCT: 32 % — ABNORMAL LOW (ref 36.0–46.0)
Hemoglobin: 10.9 g/dL — ABNORMAL LOW (ref 12.0–15.0)
Potassium: 4.5 mEq/L (ref 3.5–5.1)
Sodium: 139 mEq/L (ref 135–145)
TCO2: 26 mmol/L (ref 0–100)

## 2010-12-25 LAB — DIFFERENTIAL
Basophils Absolute: 0.1 10*3/uL (ref 0.0–0.1)
Basophils Relative: 1 % (ref 0–1)
Eosinophils Absolute: 0.3 10*3/uL (ref 0.0–0.7)
Eosinophils Relative: 4 % (ref 0–5)
Lymphocytes Relative: 19 % (ref 12–46)
Lymphs Abs: 1.6 10*3/uL (ref 0.7–4.0)
Monocytes Absolute: 0.7 10*3/uL (ref 0.1–1.0)
Monocytes Relative: 8 % (ref 3–12)
Neutro Abs: 5.8 10*3/uL (ref 1.7–7.7)
Neutrophils Relative %: 68 % (ref 43–77)

## 2010-12-25 LAB — PROTIME-INR
INR: 0.99 (ref 0.00–1.49)
Prothrombin Time: 13 seconds (ref 11.6–15.2)

## 2010-12-26 ENCOUNTER — Other Ambulatory Visit: Payer: Self-pay | Admitting: *Deleted

## 2010-12-26 MED ORDER — ATORVASTATIN CALCIUM 20 MG PO TABS: 20.0000 mg | ORAL_TABLET | Freq: Every day | ORAL | Status: DC

## 2010-12-26 MED ORDER — BENAZEPRIL HCL 10 MG PO TABS: 10.0000 mg | ORAL_TABLET | Freq: Every day | ORAL | Status: DC

## 2011-01-13 ENCOUNTER — Other Ambulatory Visit: Payer: Self-pay | Admitting: *Deleted

## 2011-01-13 MED ORDER — LEVOTHYROXINE SODIUM 100 MCG PO TABS: 100.0000 ug | ORAL_TABLET | Freq: Every day | ORAL | Status: DC

## 2011-02-03 ENCOUNTER — Ambulatory Visit (INDEPENDENT_AMBULATORY_CARE_PROVIDER_SITE_OTHER): Payer: Medicare Other | Admitting: Family Medicine

## 2011-02-03 ENCOUNTER — Encounter: Payer: Self-pay | Admitting: Family Medicine

## 2011-02-03 VITALS — BP 120/76 | HR 66 | Temp 98.7°F | Wt 153.8 lb

## 2011-02-03 DIAGNOSIS — T148 Other injury of unspecified body region: Secondary | ICD-10-CM

## 2011-02-03 DIAGNOSIS — W57XXXA Bitten or stung by nonvenomous insect and other nonvenomous arthropods, initial encounter: Secondary | ICD-10-CM | POA: Insufficient documentation

## 2011-02-03 DIAGNOSIS — T148XXA Other injury of unspecified body region, initial encounter: Secondary | ICD-10-CM

## 2011-02-03 MED ORDER — DOXYCYCLINE HYCLATE 100 MG PO CAPS: 100.0000 mg | ORAL_CAPSULE | Freq: Two times a day (BID) | ORAL | Status: AC

## 2011-02-03 NOTE — Assessment & Plan Note (Signed)
New. Currently no signs of tick borne illness but given location of tick bite (difficult for patient to see and evaluate changes), will place on 10 day course of doxycylcine. Pt to call in 1 week with an update of symptoms.

## 2011-02-03 NOTE — Patient Instructions (Signed)
Deer Tick Bites Deer ticks are brown arachnids (spider family) that vary in size from as small as the head of a pin to 1/4 inch (1/2 cm) diameter. They thrive in wooded areas. Deer are the preferred host of adult deer ticks. Small rodents are the host of young ticks (nymphs). When a person walks in a field or wooded area, young and adult ticks in the surrounding grass and vegetation can attach themselves to the skin. They can suck blood for hours to days if unnoticed. Ticks are found all over the [place] Some ticks carry a specific bacteria (Borrelia burgdorferi) that causes an infection called Lyme disease. The bacteria is typically passed into a person during the blood sucking process. This happens after the tick has been attached for at least a number of hours. While ticks can be found all over the [place] those carrying the bacteria that causes Lyme disease are most common in [place] and the [place]. Only a small proportion of ticks in these areas carry the Lyme disease bacteria and cause human infections. Ticks usually attach to warm spots on the body, such as the:  Head.   Back.   Neck.   Armpits.   Groin.  SYMPTOMS Most of the time, a deer tick bite will not be felt. You may or may not see the attached tick. You may notice mild irritation or redness around the bite site. If the deer tick passes the Lyme disease bacteria to a person, a round, red rash may be noticed 2 to 3 days after the bite. The rash may be clear in the middle, like a bull's-eye or target. If not treated, other symptoms may develop several days to weeks after the onset of the rash. These symptoms may include:  New rash lesions.   Fatigue and weakness.   General ill feeling and achiness.   Chills.   Headache and neck pain.   Swollen lymph glands.   Sore muscles and joints.  5 to 15% of untreated people with Lyme disease may develop more severe illnesses after several weeks to months. This may include  inflammation of the brain lining (meningitis), nerve palsies, an abnormal heartbeat, or severe muscle and joint pain and inflammation (myositis or arthritis). DIAGNOSIS  Physical exam and medical history.   Viewing the tick if it was saved for confirmation.   Blood tests (to check or confirm the presence of Lyme disease).  TREATMENT Most ticks do not carry disease. If found, an attached tick should be removed using tweezers. Tweezers should be placed under the body of the tick so it is removed by its attachment parts (pincers). If there are signs or symptoms of being sick, or Lyme disease is confirmed, medicines (antibiotics) that kill germs are usually prescribed. In more severe cases, antibiotics may be given through an intravenous (IV) access. HOME CARE INSTRUCTIONS  Always remove ticks with tweezers. Do not use petroleum jelly or other methods to kill or remove the tick. Slide the tweezers under the body and pull out as much as you can. If you are not sure what it is, save it in a jar and show your caregiver.   Once you remove the tick, the skin will heal on its own. Wash your hands and the affected area with water and soap. You may place a bandage on the affected area.   Take medicine as directed. You may be advised to take a full course of antibiotics.   Follow up with your caregiver as   recommended.  FINDING OUT THE RESULTS OF YOUR TEST Not all test results are available during your visit. If your test results are not back during the visit, make an appointment with your caregiver to find out the results. Do not assume everything is normal if you have not heard from your caregiver or the medical facility. It is important for you to follow up on all of your test results. PROGNOSIS If Lyme disease is confirmed, early treatment with antibiotics is very effective. Following preventive guidelines is important since it is possible to get the disease more than once. PREVENTION  Wear long  sleeves and long pants in wooded or grassy areas. Tuck your pants into your socks.   Use an insect repellent while hiking.   Check yourself, your children, and your pets regularly for ticks after playing outside.   Clear piles of leaves or brush from your yard. Ticks might live there.  SEEK MEDICAL CARE IF:  You or your child has an oral temperature above 100.4.   You develop a severe headache following the bite.   You feel generally ill.   You notice a rash.   You are having trouble removing the tick.   The bite area has red skin or yellow drainage.  SEEK IMMEDIATE MEDICAL CARE IF:  Your face is weak and droopy or you have other neurological symptoms.   You have severe joint pain or weakness.  MAKE SURE YOU:  Understand these instructions.   Will watch your condition.   Will get help right away if you are not doing well or get worse.  FOR MORE INFORMATION: Centers for Disease Control and Prevention: www.cdc.gov American Academy of Family Physicians: www.aafp.org Document Released: 12/17/2009  ExitCare Patient Information 2011 [employer]. 

## 2011-02-03 NOTE — Progress Notes (Signed)
70 yo here for tick bite.  Noticed a tick bite on her right buttocks yesterday afternoon. She is sure it was not there earlier in the morning. Her sister removed it without difficulty. Since then, there is an area of redness around it. No fevers, chills, myalgias, rashes, nausea or vomiting.  No h/o tick borne illnesses.  The PMH, PSH, Social History, Family History, Medications, and allergies have been reviewed in [facility], and have been updated if relevant.  ROS: See HPI  Physical exam: BP 120/76  Pulse 66  Temp 98.7 F (37.1 C)  Wt 153 lb 12.8 oz (69.763 kg) Gen:  Alert, very pleasant, NAD HEENT:  MMM Abd:  Soft, nt, pos bs Skin:  3 cm circular erythematous raised area on right buttocks, no central clearing. No other rashes. Lymph:  No adenopathy

## 2011-02-18 NOTE — H&P (Signed)
Carla Lyons, Carla Lyons                  ACCOUNT NO.:  [id number]   MEDICAL RECORD NO.:  [id number]          PATIENT TYPE:  INP   LOCATION:  5707                         FACILITY:  [facility]   PHYSICIAN:  Corwin Levins, MD      DATE OF BIRTH:  [birth year]   DATE OF ADMISSION:  05/22/2007  DATE OF DISCHARGE:                              HISTORY & PHYSICAL   CHIEF COMPLAINT:  Weakness, nausea, vomiting, dehydration and abdominal  pains.   HISTORY OF PRESENT ILLNESS:  Carla Lyons is a 70 year old black female  here with crampy abdominal pain, nausea and diarrhea with recurrent  loose stools and overall frustration of weakness; there has been no  fever or blood and she has noticed it seemed to start early about 6:30  p.m. August 15 and she has been worsening steadily ever since with  dizziness.  There has been no frank syncope or falls.  She had a touch  of loose stool earlier in the week on Monday, the same day she started  on the Prozac which is brand new for the last several days as well.  Also of note is that this seemed to start when she was going through her  dead husband's belongings and he has been dead for 16 months.  Also,  another family member was also ill with some nausea and gripey abdominal  discomfort, but no vomiting or diarrhea like Carla Lyons has; they did eat  together earlier in the day.   PAST MEDICAL HISTORY/ILLNESSES:  1. Hypertension.  2. Anemia.  3. History of C-spine DJD.  4. Anxiety/depression.  5. Hypercholesterolemia.  6. Hypothyroidism.   ALLERGIES:  No known drug allergies.   CURRENT MEDICATIONS:  1. Levothyroxine, unknown dose.  2. Lotensin, unknown dose.  3. Xanax 0.25 mg 1/2 p.o. b.i.d. p.r.n.  4. Lipitor 20 mg p.o. q.day.  5. Prozac, unknown dose.   SOCIAL HISTORY:  No tobacco, no alcohol, widowed, retired Primary school teacher.   FAMILY HISTORY:  Heart disease.   REVIEW OF SYSTEMS:  Otherwise noncontributory.   PHYSICAL EXAMINATION:  VITAL SIGNS:   She is afebrile.  Blood pressure  121/72, heart rate 72, respirations 22, O2 saturation 100%.  HEENT EXAM:  Sclerae clear.  TMs clear.  Oropharynx benign.  NECK:  Without lymphadenopathy, JVD or thyromegaly.  CHEST:  No rales or wheezing.  CARDIAC EXAM:  Regular rate and rhythm.  ABDOMEN:  Soft, diffusely mildly tender.  Positive bowel sounds.  EXTREMITIES:  No edema.   LABS:  Electrolytes within normal limits.  BUN 34, creatinine 1.5,  glucose 122, hemoglobin 12.6.   ASSESSMENT/PLAN:  1. Weakness with nausea, abdominal crampy pain, recurrent loose stools      and frustration.  Questionable food poisoning versus infectious      process, otherwise such as bacterial or viral versus irritable      bowel syndrome versus other.  She is to be admitted, given IV      fluids, advance diet as possible, check stool cultures, Clostridium      difficile toxin, urinalysis, culture and  sensitivity.  Will guaiac      stool.  Symptom control.  Will hold Prozac for now.  2. Hypertension.  Hold Lotensin tonight.  3. Hypothyroidism.  Check TSH.  Will need to verify doses of meds  4. Other medical problems.  Otherwise, continue home medications.  5. Code status.  Full code.  6. Prophylaxis.  Give proton pump inhibitor therapy and Lovenox      subcutaneously.  7. Disposition for home when improved.      Corwin Levins, MD  Electronically Signed     JWJ/MEDQ  D:  05/22/2007  T:  05/22/2007  Job:  [id number]   cc:   Carla Silence, MD

## 2011-02-18 NOTE — Discharge Summary (Signed)
Carla Lyons, Carla Lyons                  ACCOUNT NO.:  [id number]   MEDICAL RECORD NO.:  [id number]          PATIENT TYPE:  INP   LOCATION:  5706                         FACILITY:  [facility]   PHYSICIAN:  Valerie A. Felicity Coyer, MDDATE OF BIRTH:  [birth year]   DATE OF ADMISSION:  05/22/2007  DATE OF DISCHARGE:  05/24/2007                               DISCHARGE SUMMARY   DICTATED BY:  Sandford Craze, NP   DISCHARGE DIAGNOSES:  1. Acute gastroenteritis question viral, question related to food, but      symptoms resolved.  2. Fecal occult blood positive with normocytic anemia, plan for      outpatient colonoscopy.  3. Hypothyroid.  4. Anxiety/depression.  5. Dyslipidemia.   HISTORY OF PRESENT ILLNESS:  Carla Lyons is a 70 year old female who was  admitted on May 21, 2007 with chief complaint of weakness, nausea,  vomiting, dehydration and abdominal pain.  She has also had no prior  complaint of weakness.  She was admitted for further evaluation and  treatment.   PAST MEDICAL HISTORY:  1. Hypertension.  2. Anemia.  3. History of C-spine degenerative joint disease.  4. Anxiety/depression.  5. Hypercholesterolemia.  6. Hypothyroidism.   COURSE OF HOSPITALIZATION:  PROBLEM:  1. Weakness with nausea, vomiting, diarrhea.  The patient was      admitted.  Stool studies were ordered.  Stool was negative for C.      diff and Giardia.  The remainder of stool studies are currently      pending at time of this dictation.  The patient's symptoms have      resolved.  2. Normocytic anemia.  Heme-positive stool.  The patient was noted to      have a hemoglobin of 8.8 today, which is down from 10.2 ten days      ago.  Stool was checked for fecal occult blood and was possible.  A      GI consult was requested and the patient was seen by Dr. Stan Head.  Plans at this time are to perform an outpatient      colonoscopy on June 15, 2007.  We will arrange a follow up      appointment for the  patient this week for a follow up CBC.   MEDICATIONS AT TIME OF DISCHARGE:  1. Lipitor 20 mg p.o. daily.  2. Benazepril 10 mg p.o. daily.  3. Levothyroxine 75 mcg p.o. daily.  4. Citracal plus D as before.  5. Alprazolam 0.25 mg p.o. daily as needed.  6. Aspirin 81 mg p.o. daily.   DISPOSITION/PLAN:  Discharged the patient to home.   FOLLOWUP:  The patient is instructed to follow up with the RN at [facility] on Wednesday, August 27 at 1:30 p.m. and with Dr. Stan Head  on June 15, 2007 at 2:30 p.m.      Valerie A. Felicity Coyer, MD  Electronically Signed     VAL/MEDQ  D:  05/24/2007  T:  05/24/2007  Job:  [id number]

## 2011-02-18 NOTE — Assessment & Plan Note (Signed)
[facility] OFFICE NOTE   NAME:Carla Lyons, Carla Lyons                           MRN:          [id number]  DATE:06/11/2007                            DOB:          [birth year]    [name] returns today to review her 2D echo and her 24-hour Holter.  She complained of minimal tachy palpitations and was bradycardic, which  was asymptomatic.   Her 2D echocardiogram is completely normal.  Her 24-hour Holter showed  extremely rare PVCs and infrequent supraventricular beats.  She had one  4-beat run of SVT that was asymptomatic.  No symptoms were recorded in  her diary.  There were no atrial arrhythmias otherwise.   I have reviewed these findings in detail with [name].  I have  reassured that as long as she is not having symptoms of presyncope,  syncope, profound shortness of breath that she does not need to worry  about her heart rate in the 50's on average.  She does walk several  miles a day and this may just reflect good conditioning.   Will see her back on a p.r.n. basis.     Thomas C. Daleen Squibb, MD, [facility]  Electronically Signed    TCW/MedQ  DD: 06/11/2007  DT: 06/11/2007  Job #: [id number]   cc:   Arta Silence, MD

## 2011-02-18 NOTE — Assessment & Plan Note (Signed)
Carla Lyons OFFICE NOTE   NAME:Carla Lyons, Carla Lyons                           MRN:          [id number]  DATE:05/26/2007                            DOB:          27-Sep-1941    CHIEF COMPLAINT:  Feel my heart racing early in the morning when I get  up.   HISTORY OF PRESENT ILLNESS:  Carla Lyons is a delightful 70 year old  widowed white female, whose husband was a former patient of mine who  comes today with the above complaint.   She was admitted to [facility] this past weekend with feeling  weak, nausea and vomiting.  She was found to be iron deficient anemia  and scheduled for colonoscopy with Dr. Stan Head in the next week or  two.  Her laboratory data other than showing some anemia was  unremarkable including a TSH.  During her hospitalization she was noted  to be bradycardia in the 40s.  She was apparently always in sinus rhythm  but I do not have the records.  EKGs sent to me show sinus brady with  normal PR, QRS and QTC interval.   She has no previous cardiac history.  She does all her yard work, has  been very active doing work around the house since she lost her husband.  She has been going to his shop and other items over the last 3 weeks and  has been extremely anxious and has a history of anxiety and depression.   Her complaint is that of some rapid heartbeats that are not sustained  early in the morning.  She does not have them when she is up and about.  She denies any syncope but does have some lightheadedness sometimes with  these palpitations.  She denies any angina or ischemic symptoms.  She  has had no symptoms of decompensated congestive heart failure or other  symptoms.   PAST MEDICAL HISTORY:  NO DYE ALLERGY.   MEDICATIONS:  1. Benazepril 10 mg a day.  2. Levothyroxine 75 mcg a day.  3. Lipitor 20 mg a day.  4. Aspirin 81 mg two daily.  5. Citracal and D daily.  6. Iron 325  daily.   She is supposed to go back on her Prozac now that she has seen Dr.  Hetty Ely.   She has a history of remote smoking but quit in 2003.  She does not  drink caffeine.  She walks 2-3 miles a day.  She does not have elevated  cholesterol.   She has had a previous hysterectomy in 1976.   FAMILY HISTORY:  Unremarkable and noncontributory.   SOCIAL HISTORY:  She is retired.  She is widowed.  She has 2 children  that live locally.  She has family support.   REVIEW OF SYSTEMS:  Other than fatigue, anxiety and depression is  negative other than the HPI.   EXAMINATION:  Her blood pressure is 168/78, her pulse is 44 and regular,  she is 5 foot 3, weighs 134 pounds.  Skin is warm and dry,  she is in no  acute distress.  Alert and oriented.  NEURO:  Intact.  HEENT:  Normocephalic/atraumatic, PERRLA, extraocular movements intact,  skin is pale.  Carotid upstrokes were equal bilaterally without bruits,  no JVD, thyroid is not enlarged, trachea is midline.  LUNGS:  Clear.  HEART:  Reveals a nondisplaced PMI, normal S1-S2, slow rate and rhythm.  ABDOMINAL:  Soft, good bowel sounds, no midline bruits, no hepatomegaly.  EXTREMITIES:  With no cyanosis, clubbing or edema.  Pulses are brisk.  Venous varicosities noted, no DVT.   Please note during her hospitalization she had a CT scan which ruled out  pulmonary emboli.  Her chest x-ray showed no acute cardiopulmonary  disease.   ASSESSMENT:  1. Minimal tachy palpitations.  Rule out supraventricular tachycardia      or atrial arrhythmia.  2. Bradycardia which is totally asymptomatic.  She is not on any      atrioventricular nodal blocking or slowing drugs.  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  3. Iron deficiency anemia, in need of colonoscopy.  4. Hypertension.  5. Anxiety/depression.   PLAN:  1. A 2D echocardiogram to rule out structural heart disease.  2. A 24-hour Holter monitor  with diary.   I will schedule her for followup in 2 weeks to discuss the findings.  Hopefully reassurance is all we need.     Thomas C. Daleen Squibb, MD, [facility]  Electronically Signed    TCW/MedQ  DD: 05/26/2007  DT: 05/27/2007  Job #: [id number]   cc:   Arta Silence, MD

## 2011-03-11 ENCOUNTER — Encounter: Payer: Self-pay | Admitting: Family Medicine

## 2011-03-11 ENCOUNTER — Telehealth: Payer: Self-pay | Admitting: *Deleted

## 2011-03-11 NOTE — Telephone Encounter (Signed)
FYI Pt bit on abd  by an unknown insect last thurs, c/o redness to area around bite and it is getting bigger ( now half dollar size), she had been using neosporin, but  bite is at her waist and pants occassionally  rub/ irritate area. She scheduled appt to see you tomorrow. I advised her to keep area clean, dry and try wearing clothes that will not irritate bite, keep appt tomorrow.

## 2011-03-12 ENCOUNTER — Encounter: Payer: Self-pay | Admitting: Family Medicine

## 2011-03-12 ENCOUNTER — Ambulatory Visit (INDEPENDENT_AMBULATORY_CARE_PROVIDER_SITE_OTHER): Payer: Medicare Other | Admitting: Family Medicine

## 2011-03-12 VITALS — BP 140/90 | HR 45 | Temp 98.4°F | Wt 156.5 lb

## 2011-03-12 DIAGNOSIS — T148 Other injury of unspecified body region: Secondary | ICD-10-CM

## 2011-03-12 DIAGNOSIS — W57XXXA Bitten or stung by nonvenomous insect and other nonvenomous arthropods, initial encounter: Secondary | ICD-10-CM

## 2011-03-12 MED ORDER — DOXYCYCLINE HYCLATE 50 MG PO CAPS: 50.0000 mg | ORAL_CAPSULE | Freq: Two times a day (BID) | ORAL | Status: AC

## 2011-03-12 NOTE — Patient Instructions (Signed)
Take Benadryl 25 mg one to two times daily for next several days. Apply hydrocortisone 1% cream over the counter, twice daily. If symptoms worsen or do not improve with this, please start taking the Doxycycline.

## 2011-03-12 NOTE — Progress Notes (Signed)
70 yo here for bug bite.  Was mowing the lawn last week, felt something on her abdomen, left lower quadrant, scratched at it but did not see a bug. That night, looked like a mosquito bite. Since then, progressively more pruritic, redness around it is growing.   No warmth, fevers, chills, other rashes. NO n/v/d, sensitivity to light, headaches or abdominal pain.     The PMH, PSH, Social History, Family History, Medications, and allergies have been reviewed in [facility], and have been updated if relevant.  ROS: See HPI  Physical exam: BP 140/90  Pulse 45  Temp(Src) 98.4 F (36.9 C) (Oral)  Wt 156 lb 8 oz (70.988 kg)  Gen:  Alert, very pleasant, NAD HEENT:  MMM Abd:  Soft, nt, pos bs Skin:  5 cm circular erythematous raised area on left lower quadrant of abdomen, no central clearing. No other rashes. Lymph:  No adenopathy A/P- bug bite- ?allergic dermatitis. Does not appear infected at this point. Will try Benadryl and hydrocortisone cream Given rx for Doxycline to fill if symptoms worsen or do not improve with above tx. See pt instructions for details.

## 2011-03-19 ENCOUNTER — Other Ambulatory Visit: Payer: Self-pay | Admitting: *Deleted

## 2011-03-19 MED ORDER — LEVOTHYROXINE SODIUM 100 MCG PO TABS: 100.0000 ug | ORAL_TABLET | Freq: Every day | ORAL | Status: DC

## 2011-04-23 ENCOUNTER — Other Ambulatory Visit: Payer: Self-pay | Admitting: *Deleted

## 2011-04-23 MED ORDER — ALPRAZOLAM 0.25 MG PO TABS: 0.2500 mg | ORAL_TABLET | Freq: Every evening | ORAL | Status: DC | PRN

## 2011-04-23 NOTE — Telephone Encounter (Signed)
Rx called to [facility]. 

## 2011-04-23 NOTE — Telephone Encounter (Signed)
Last filled 02/01/11

## 2011-05-27 ENCOUNTER — Inpatient Hospital Stay (HOSPITAL_COMMUNITY)
Admission: EM | Admit: 2011-05-27 | Discharge: 2011-05-28 | DRG: 313 | Disposition: A | Discharge status: Home / Self Care | Payer: Medicare Other | Attending: Internal Medicine | Admitting: Internal Medicine

## 2011-05-27 ENCOUNTER — Telehealth: Payer: Self-pay | Admitting: *Deleted

## 2011-05-27 ENCOUNTER — Emergency Department (HOSPITAL_COMMUNITY): Payer: Medicare Other

## 2011-05-27 DIAGNOSIS — R0789 Other chest pain: Principal | ICD-10-CM | POA: Diagnosis present

## 2011-05-27 DIAGNOSIS — E785 Hyperlipidemia, unspecified: Secondary | ICD-10-CM | POA: Diagnosis present

## 2011-05-27 DIAGNOSIS — I10 Essential (primary) hypertension: Secondary | ICD-10-CM | POA: Diagnosis present

## 2011-05-27 DIAGNOSIS — D649 Anemia, unspecified: Secondary | ICD-10-CM | POA: Diagnosis present

## 2011-05-27 DIAGNOSIS — Z79899 Other long term (current) drug therapy: Secondary | ICD-10-CM

## 2011-05-27 DIAGNOSIS — F329 Major depressive disorder, single episode, unspecified: Secondary | ICD-10-CM | POA: Diagnosis present

## 2011-05-27 DIAGNOSIS — E039 Hypothyroidism, unspecified: Secondary | ICD-10-CM | POA: Diagnosis present

## 2011-05-27 DIAGNOSIS — F411 Generalized anxiety disorder: Secondary | ICD-10-CM | POA: Diagnosis present

## 2011-05-27 DIAGNOSIS — M5030 Other cervical disc degeneration, unspecified cervical region: Secondary | ICD-10-CM | POA: Diagnosis present

## 2011-05-27 DIAGNOSIS — F3289 Other specified depressive episodes: Secondary | ICD-10-CM | POA: Diagnosis present

## 2011-05-27 DIAGNOSIS — Z7982 Long term (current) use of aspirin: Secondary | ICD-10-CM

## 2011-05-27 DIAGNOSIS — Z87891 Personal history of nicotine dependence: Secondary | ICD-10-CM

## 2011-05-27 LAB — URINALYSIS, ROUTINE W REFLEX MICROSCOPIC
Bilirubin Urine: NEGATIVE
Glucose, UA: NEGATIVE mg/dL
Hgb urine dipstick: NEGATIVE
Ketones, ur: NEGATIVE mg/dL
Nitrite: NEGATIVE
Protein, ur: NEGATIVE mg/dL
Specific Gravity, Urine: 1.009 (ref 1.005–1.030)
Urobilinogen, UA: 0.2 mg/dL (ref 0.0–1.0)
pH: 6.5 (ref 5.0–8.0)

## 2011-05-27 LAB — COMPREHENSIVE METABOLIC PANEL
ALT: 16 U/L (ref 0–35)
AST: 19 U/L (ref 0–37)
Albumin: 4.2 g/dL (ref 3.5–5.2)
Alkaline Phosphatase: 49 U/L (ref 39–117)
BUN: 34 mg/dL — ABNORMAL HIGH (ref 6–23)
CO2: 28 mEq/L (ref 19–32)
Calcium: 9.9 mg/dL (ref 8.4–10.5)
Chloride: 101 mEq/L (ref 96–112)
Creatinine, Ser: 1.21 mg/dL — ABNORMAL HIGH (ref 0.50–1.10)
GFR calc Af Amer: 53 mL/min — ABNORMAL LOW (ref >60)
GFR calc non Af Amer: 44 mL/min — ABNORMAL LOW (ref >60)
Glucose, Bld: 99 mg/dL (ref 70–99)
Potassium: 4.4 mEq/L (ref 3.5–5.1)
Sodium: 138 mEq/L (ref 135–145)
Total Bilirubin: 0.5 mg/dL (ref 0.3–1.2)
Total Protein: 7.4 g/dL (ref 6.0–8.3)

## 2011-05-27 LAB — CK TOTAL AND CKMB (NOT AT ARMC)
CK, MB: 3.4 ng/mL (ref 0.3–4.0)
Relative Index: 2.2 (ref 0.0–2.5)
Total CK: 152 U/L (ref 7–177)

## 2011-05-27 LAB — CBC
HCT: 34.4 % — ABNORMAL LOW (ref 36.0–46.0)
Hemoglobin: 11.6 g/dL — ABNORMAL LOW (ref 12.0–15.0)
MCH: 32.8 pg (ref 26.0–34.0)
MCHC: 33.7 g/dL (ref 30.0–36.0)
MCV: 97.2 fL (ref 78.0–100.0)
Platelets: 206 10*3/uL (ref 150–400)
RBC: 3.54 MIL/uL — ABNORMAL LOW (ref 3.87–5.11)
RDW: 12.1 % (ref 11.5–15.5)
WBC: 6.6 10*3/uL (ref 4.0–10.5)

## 2011-05-27 LAB — D-DIMER, QUANTITATIVE (NOT AT ARMC): D-Dimer, Quant: 0.7 ug/mL-FEU — ABNORMAL HIGH (ref 0.00–0.48)

## 2011-05-27 LAB — PRO B NATRIURETIC PEPTIDE: Pro B Natriuretic peptide (BNP): 56.3 pg/mL (ref 0–125)

## 2011-05-27 LAB — URINE MICROSCOPIC-ADD ON

## 2011-05-27 LAB — LIPASE, BLOOD: Lipase: 51 U/L (ref 11–59)

## 2011-05-27 LAB — TROPONIN I: Troponin I: <0.3 ng/mL (ref <0.30)

## 2011-05-27 NOTE — Telephone Encounter (Signed)
I do want her to seek attention in UC or ER for this  Thanks

## 2011-05-27 NOTE — Telephone Encounter (Signed)
Pt states she took the half the xanax, feels better.  She made appt for tomorrow morning with Dr. Reece Agar but says she will go to ER tonight if symptoms worsen.

## 2011-05-27 NOTE — Telephone Encounter (Signed)
Pt called stating that she has back pain, somewhat in her chest, but mostly in her back, since this week end.  It's worse today and she has had nausea since this morning.  Per Dr. Milinda Antis I advised pt to go to urgent care or ER today.  She said she is going to take another half of a xanax today and see how she feels.  I advised pt that back or chest pain with other symptoms such as shortness of breath, nausea, sweating, dizziness can be dangerous and if pt experiences any of these she needs to seek care.  I asked her to call us back and let us know what she decides to do.

## 2011-05-28 ENCOUNTER — Ambulatory Visit: Payer: Medicare Other | Admitting: Family Medicine

## 2011-05-28 ENCOUNTER — Inpatient Hospital Stay (HOSPITAL_COMMUNITY): Payer: Medicare Other

## 2011-05-28 DIAGNOSIS — R072 Precordial pain: Secondary | ICD-10-CM

## 2011-05-28 LAB — CBC
HCT: 33.6 % — ABNORMAL LOW (ref 36.0–46.0)
HCT: 34.7 % — ABNORMAL LOW (ref 36.0–46.0)
Hemoglobin: 11.3 g/dL — ABNORMAL LOW (ref 12.0–15.0)
Hemoglobin: 11.4 g/dL — ABNORMAL LOW (ref 12.0–15.0)
MCH: 32.5 pg (ref 26.0–34.0)
MCH: 32 pg (ref 26.0–34.0)
MCHC: 33.6 g/dL (ref 30.0–36.0)
MCHC: 32.9 g/dL (ref 30.0–36.0)
MCV: 96.6 fL (ref 78.0–100.0)
MCV: 97.5 fL (ref 78.0–100.0)
Platelets: 191 10*3/uL (ref 150–400)
Platelets: 181 10*3/uL (ref 150–400)
RBC: 3.48 MIL/uL — ABNORMAL LOW (ref 3.87–5.11)
RBC: 3.56 MIL/uL — ABNORMAL LOW (ref 3.87–5.11)
RDW: 12.4 % (ref 11.5–15.5)
RDW: 12.3 % (ref 11.5–15.5)
WBC: 6.4 10*3/uL (ref 4.0–10.5)
WBC: 6.2 10*3/uL (ref 4.0–10.5)

## 2011-05-28 LAB — COMPREHENSIVE METABOLIC PANEL
ALT: 13 U/L (ref 0–35)
ALT: 14 U/L (ref 0–35)
AST: 17 U/L (ref 0–37)
AST: 18 U/L (ref 0–37)
Albumin: 3.9 g/dL (ref 3.5–5.2)
Albumin: 3.8 g/dL (ref 3.5–5.2)
Alkaline Phosphatase: 47 U/L (ref 39–117)
Alkaline Phosphatase: 48 U/L (ref 39–117)
BUN: 30 mg/dL — ABNORMAL HIGH (ref 6–23)
BUN: 27 mg/dL — ABNORMAL HIGH (ref 6–23)
CO2: 26 mEq/L (ref 19–32)
CO2: 27 mEq/L (ref 19–32)
Calcium: 9.2 mg/dL (ref 8.4–10.5)
Calcium: 9.3 mg/dL (ref 8.4–10.5)
Chloride: 103 mEq/L (ref 96–112)
Chloride: 106 mEq/L (ref 96–112)
Creatinine, Ser: 1.06 mg/dL (ref 0.50–1.10)
Creatinine, Ser: 1.02 mg/dL (ref 0.50–1.10)
GFR calc Af Amer: >60 mL/min (ref >60)
GFR calc Af Amer: >60 mL/min (ref >60)
GFR calc non Af Amer: 51 mL/min — ABNORMAL LOW (ref >60)
GFR calc non Af Amer: 54 mL/min — ABNORMAL LOW (ref >60)
Glucose, Bld: 92 mg/dL (ref 70–99)
Glucose, Bld: 90 mg/dL (ref 70–99)
Potassium: 4.4 mEq/L (ref 3.5–5.1)
Potassium: 4.5 mEq/L (ref 3.5–5.1)
Sodium: 137 mEq/L (ref 135–145)
Sodium: 142 mEq/L (ref 135–145)
Total Bilirubin: 0.7 mg/dL (ref 0.3–1.2)
Total Bilirubin: 0.8 mg/dL (ref 0.3–1.2)
Total Protein: 6.8 g/dL (ref 6.0–8.3)
Total Protein: 6.9 g/dL (ref 6.0–8.3)

## 2011-05-28 LAB — MAGNESIUM: Magnesium: 2 mg/dL (ref 1.5–2.5)

## 2011-05-28 LAB — IRON AND TIBC
Iron: 72 ug/dL (ref 42–135)
Saturation Ratios: 19 % — ABNORMAL LOW (ref 20–55)
TIBC: 388 ug/dL (ref 250–470)
UIBC: 316 ug/dL

## 2011-05-28 LAB — CK TOTAL AND CKMB (NOT AT ARMC)
CK, MB: 2.9 ng/mL (ref 0.3–4.0)
CK, MB: 2.9 ng/mL (ref 0.3–4.0)
Relative Index: 2.2 (ref 0.0–2.5)
Relative Index: 2.3 (ref 0.0–2.5)
Total CK: 133 U/L (ref 7–177)
Total CK: 124 U/L (ref 7–177)

## 2011-05-28 LAB — LIPID PANEL
Cholesterol: 167 mg/dL (ref 0–200)
HDL: 58 mg/dL (ref >39)
LDL Cholesterol: 83 mg/dL (ref 0–99)
Total CHOL/HDL Ratio: 2.9 RATIO
Triglycerides: 130 mg/dL (ref <150)
VLDL: 26 mg/dL (ref 0–40)

## 2011-05-28 LAB — FERRITIN: Ferritin: 30 ng/mL (ref 10–291)

## 2011-05-28 LAB — TROPONIN I
Troponin I: <0.3 ng/mL (ref <0.30)
Troponin I: <0.3 ng/mL (ref <0.30)

## 2011-05-28 LAB — HEMOGLOBIN A1C
Hgb A1c MFr Bld: 5.7 % — ABNORMAL HIGH (ref <5.7)
Mean Plasma Glucose: 117 mg/dL — ABNORMAL HIGH (ref <117)

## 2011-05-28 LAB — PHOSPHORUS: Phosphorus: 3.2 mg/dL (ref 2.3–4.6)

## 2011-05-28 LAB — VITAMIN B12: Vitamin B-12: 574 pg/mL (ref 211–911)

## 2011-05-28 LAB — TSH: TSH: 1.504 u[IU]/mL (ref 0.350–4.500)

## 2011-05-28 LAB — FOLATE: Folate: >20 ng/mL

## 2011-05-28 MED ORDER — IOHEXOL 350 MG/ML SOLN
100.0000 mL | Freq: Once | INTRAVENOUS | Status: AC | PRN
  Administered 2011-05-28: 100 mL via INTRAVENOUS

## 2011-05-29 NOTE — Discharge Summary (Signed)
NAMEVALLI, RANDOL                  ACCOUNT NO.:  [id number]  MEDICAL RECORD NO.:  [id number]  LOCATION:  3703                         FACILITY:  [facility]  PHYSICIAN:  Andreas Blower, MD       DATE OF BIRTH:  1941/04/13  DATE OF ADMISSION:  05/27/2011 DATE OF DISCHARGE:  05/28/2011                              DISCHARGE SUMMARY   PRIMARY CARE PHYSICIAN:  Dr. Dayton Martes.  CARDIOLOGIST:  Jesse Sans. Wall, MD, [facility]  DISCHARGE DIAGNOSES: 1. Chest pain ruled out for acute coronary syndrome, 2. Hypertension. 3. Normocytic anemia. 4. History of C-spine degenerative disk disease. 5. Anxiety. 6. Depression. 7. History of hyperlipidemia. 8. Hypothyroidism.  DISCHARGE MEDICATIONS: 1. Aspirin 81 mg 2 tablets p.o. q.a.m. 2. Benazepril 10 mg p.o. daily at bedtime. 3. Fish oral over-the-counter 2 capsules p.o. q.a.m. 4. Hair, skin and nail vitamin over-the-counter 1 tablet p.o. q.a.m. 5. Iron 325 mg p.o. q.a.m. 6. Levothyroxine 100 mcg p.o. q.a.m. 7. Atorvastatin 20 mg p.o. nightly. 8. Multivitamin 1 tablet p.o. q.a.m. 9. Vitamin B12 500 mcg p.o. q.a.m. 10.Vitamin D 1000 units 2 tablets p.o. q.a.m.  BRIEF ADMITTING HISTORY AND PHYSICAL:  Ms. Carla Lyons is a 70 year old Caucasian female with a history of hypertension, hyperlipidemia, degenerative disk disease, anxiety who presented with chest pain.  RADIOLOGY/IMAGING:  The patient had chest x-ray two-view which showed stable central bronchial thickening and interstitial prominence.  No superimposed acute process. The patient had CT of the chest and neck which was negative for aortic dissection or aneurysm.  Chronic lung changes with mild peripheral fibrosis and scarring.  Scattered pulmonary nodules are unchanged from 2008 and likely benign.  Coronary artery calcifications noted.  Hiatal hernia noted. CT angiogram of the neck showed no arterial dissection or acute vascular findings of the neck.  High-grade stenosis (radiographic string sign of the  left vertebral artery origin.  Up to 60% atherosclerotic stenosis at the left ICA origin and up to 55% atherosclerotic stenosis at the right ICA origin).  Subcentimeter hypodense right thyroid nodule likely inconsequential. The patient had a complete abdominal ultrasound which showed normal gallbladder.  Negative for gallstones.  No acute findings by ultrasound. Limited visualization of pancreas.  LABORATORY DATA:  CBC shows a white count of 6.2, hemoglobin 11.4, hematocrit 34.7, platelet count 181.  Electrolytes normal with a BUN of 27, creatinine 1.02.  Liver function tests normal.  Hemoglobin A1c 5.7. Troponins negative x3.  BNP 53.6, LDL 83.  TSH 1.504.  Serum iron 72. UA is negative for nitrites and small leukocytes.  2D ECHO on 05/28/2011   Study Conclusions   Left ventricle: The cavity size was normal. Systolic function was   normal. The estimated ejection fraction was in the range of 55% to   60%. Wall motion was normal; there were no regional wall motion   abnormalities.    HOSPITAL COURSE BY PROBLEM: 1. Chest pain.  The patient was admitted and was ruled out for acute     coronary syndrome.  The patient's chest pain improved during the     course of the hospital stay.  The patient does not have early     family history of  heart problems.  Her risk factors are age which     she is greater than 69 years of age and she also has hypertension     and hyperlipidemia.  The patient also has been using aspirin within     the last 7 days.  The patient did not have any findings on tele for     24 hours.  CT of the chest and neck looking for dissections were     negative.  The patient was instructed to have cardiology followup     on these results as an outpatient when she goes for an outpatient     stress test as she has been ruled out for acute coronary syndrome. 2. Hypertension.  Continue the patient on home medications.  Further     titration of antihypertensive medications to be  done as an     outpatient. 3. Anemia, normocytic anemia.  Serum iron was negative.  TIBC was     normal. 4. Hyperlipidemia.  Continue the patient on statin.  LDL was 83. 5. Hypothyroidism.  TSH was checked and was 1.504 which is normal.  DISPOSITION AND FOLLOWUP:  The patient was instructed to follow up with [facility] for consideration of outpatient stress test. Discussed with [facility] about the patient and they will arrange for an outpatient followup.  The patient was given [facility] cardiologist's phone number in case they do not call over the next day or so for the followup appointment.  She was instructed that if she has recurrence of these pain, she is to come back to the ER for further evaluation. Patient was called and notified of 2D ECHO results.  Time spent on discharge talking to the patient and coordinating care was 25 minutes.     Andreas Blower, MD     SR/MEDQ  D:  05/28/2011  T:  05/28/2011  Job:  [id number]  Electronically Signed by Wardell Heath Gillie Fleites  on 05/29/2011 09:04:17 PM

## 2011-06-05 ENCOUNTER — Other Ambulatory Visit: Payer: Self-pay | Admitting: *Deleted

## 2011-06-05 MED ORDER — VALACYCLOVIR HCL 1 G PO TABS: 1000.0000 mg | ORAL_TABLET | Freq: Every day | ORAL | Status: DC

## 2011-06-05 NOTE — Telephone Encounter (Signed)
OK to refill

## 2011-06-11 ENCOUNTER — Other Ambulatory Visit: Payer: Self-pay | Admitting: *Deleted

## 2011-06-11 NOTE — Telephone Encounter (Signed)
Received fax from [facility] stating that patient has a question about the last Valtrex Rx we sent in for her.  She stated that in the past she has always uses two tablets by mouth every twelve hours times 1-2 days for cold sore.  The directions the pharmacy received from [place] were one tablet by mouth daily.  Please advise which dosage is correct.

## 2011-06-12 NOTE — Telephone Encounter (Signed)
There are different ways to take it. 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 If you have an episode- you can take 2000 mg every 12 hours for 1 day.

## 2011-06-13 MED ORDER — VALACYCLOVIR HCL 1 G PO TABS: ORAL_TABLET | ORAL | Status: DC

## 2011-06-13 NOTE — Telephone Encounter (Signed)
Left message on machine at home for patient to return call. 

## 2011-06-13 NOTE — Telephone Encounter (Signed)
Spoke with patient and was advised that she take two tablets by mouth every 12 hours for one day when she has an outbreak.  Rx sent to [facility].

## 2011-06-20 NOTE — H&P (Signed)
Carla Lyons, GIGNAC                  ACCOUNT NO.:  [id number]  MEDICAL RECORD NO.:  [id number]  LOCATION:  [facility]                         FACILITY:  [facility]  PHYSICIAN:  Richarda Overlie, MD       DATE OF BIRTH:  1941/05/14  DATE OF ADMISSION:  05/27/2011 DATE OF DISCHARGE:                             HISTORY & PHYSICAL   PRIMARY CARE PHYSICIAN:  Ruthe Mannan, MD.  CHIEF COMPLAINT:  Chest pain.  SUBJECTIVE:  This is a 70 year old female who presents to the ER with a chief complaint of chest pain and back pain.  The patient states that her pain started in the upper back early this morning after she ate breakfast.  The patient subsequently started experiencing pain in her substernal area associated with some nausea this morning.  The pain radiated into the left side of the neck in the left side of the chest wall.  The pain was particularly worse with movement.  The patient felt like she had a pulled muscle.  The pain was not associated with any palpitations or shortness of breath or diaphoresis.  She denies any previous episodes of chest pain.  She denied any fever, chills, rigors, or cough over the last few days.  She denied any travel or sick contacts.  The patient took 4 baby aspirins and 2 tablets of 0.5 mg of Xanax without any relief.  The patient denies any epigastric pain, any episodes of vomiting, any blood in the stool, black tarry stool.  She denies any urinary urgency, frequency, or dysuria.  She denies any dizziness, syncope, or near syncopal-like episode.  The patient does not have any history of coronary artery disease and denies ever having any cardiac stress test in the past.  However, she did have workup for bradycardia with a 24-hour Holter monitor which showed PVCs and infrequent supraventricular beats.  She denies being started on any new medications.  REVIEW OF SYSTEMS:  A 14-point review of systems was done as documented in HPI.  ALLERGIES:  No known drug  allergies.  PAST MEDICAL HISTORY:  Hypertension, anemia, history of C-spine degenerative disk disease, anxiety, depression, dyslipidemia, hypothyroidism.  PAST SURGICAL HISTORY:  None.  SOCIAL HISTORY:  The patient has a remote history of smoking and quit smoking in 2003.  Denies any use alcohol use.  She is a retired Runner, broadcasting/film/video. She quit smoking more than 30 years ago.  FAMILY HISTORY:  Positive for heart disease in the father.  HOME MEDICATIONS:  Hair, skin and nails; vitamin over-the-counter; fish oil; vitamin B12; vitamin D; multivitamin; iron; aspirin; benazepril; Lipitor; levothyroxine.  PHYSICAL EXAMINATION:  VITAL SIGNS:  Blood pressure 120/80, pulse of 68, respirations 16, temperature 97.6. GENERAL:  Comfortable, currently in no acute cardiopulmonary distress. HEENT:  Pupils equal and reactive.  Extraocular movements intact. NECK:  Supple.  No JVD. LUNGS:  Clear to auscultation bilaterally.  No wheezes or crackles or rhonchi. CARDIOVASCULAR:  Regular rate and rhythm.  No murmurs, rubs, or gallops. ABDOMEN:  Obese, soft, nontender, nondistended.  Murphy sign is negative.  Normoactive bowel sounds. EXTREMITIES:  Without cyanosis, clubbing, or edema. NEUROLOGIC:  Cranial nerves II through XII grossly intact. PSYCHIATRIC:  Appropriate mood and affect. LYMPHATIC:  No axillary, inguinal, or cervical lymphadenopathy.  LABORATORY DATA:  Urinalysis negative except for a small amount of leukocyte esterase and 7-10 wbc's per high-power field.  Troponin negative.  CK-MB and relative index is negative.  CMP:  Sodium 138, potassium 4.4, chloride 101, bicarb 28, glucose 99, BUN 34, creatinine 1.21, total bilirubin 0.5, AST 19, ALT 16, calcium 9.9.  CBC:  WBC 6.6, hemoglobin 11.6, hematocrit 34.4, and platelet count of 206.  ASSESSMENT AND PLAN: 1. Atypical chest pain, likely musculoskeletal pain, rule out     pulmonary embolism, rule out dissection, rule out acute coronary      syndrome. 2. Normocytic anemia which is chronic for her.  She has had age-     appropriate screening. 3. Hypertension. 4. Hypothyroidism.  PLAN:  The patient will be admitted to the telemetry floor because of her chest pain.  Because of the nature of the pain and the radiation into the back and into her neck, we will rule out dissection.  We will do a CT dissection protocol.  Also rule out carotid artery dissection. The patient will be admitted for her symptoms.  We will cycle cardiac enzymes, maintain her on telemetry.  Her EKG in the ER is essentially normal with a rate of 74 beats per minute, and we will continue her on a full-dose aspirin and statin.  We will hold any beta-blockers at this time because of her sinus bradycardia.  We will workup her anemia by checking an anemia panel, TSH, and stool guaiacs.  Because of associated nausea this morning, we will do a right upper quadrant ultrasound to rule out gallstones.  If the patient has been ruled out for an acute coronary syndrome and her workup is essentially negative, then she may be scheduled for an outpatient stress test.  She is a full code.     Richarda Overlie, MD     NA/MEDQ  D:  05/27/2011  T:  05/27/2011  Job:  [id number]  Electronically Signed by Richarda Overlie MD on 06/20/2011 07:44:49 AM

## 2011-07-18 LAB — BASIC METABOLIC PANEL
BUN: 17
BUN: 20
CO2: 24
CO2: 25
Calcium: 8.2 — ABNORMAL LOW
Calcium: 8.4
Chloride: 112
Chloride: 114 — ABNORMAL HIGH
Creatinine, Ser: 1.03
Creatinine, Ser: 1.05
GFR calc Af Amer: >60
GFR calc Af Amer: >60
GFR calc non Af Amer: 53 — ABNORMAL LOW
GFR calc non Af Amer: 54 — ABNORMAL LOW
Glucose, Bld: 94
Glucose, Bld: 96
Potassium: 4.4
Potassium: 4.7
Sodium: 144
Sodium: 144

## 2011-07-18 LAB — FOLATE: Folate: 11.6

## 2011-07-18 LAB — URINALYSIS, ROUTINE W REFLEX MICROSCOPIC
Bilirubin Urine: NEGATIVE
Glucose, UA: NEGATIVE
Hgb urine dipstick: NEGATIVE
Ketones, ur: NEGATIVE
Nitrite: NEGATIVE
Protein, ur: NEGATIVE
Specific Gravity, Urine: 1.01
Urobilinogen, UA: 0.2
pH: 5.5

## 2011-07-18 LAB — POCT I-STAT CREATININE
Creatinine, Ser: 1.5 — ABNORMAL HIGH
Operator id: 192351

## 2011-07-18 LAB — VITAMIN B12: Vitamin B-12: 273 (ref 211–911)

## 2011-07-18 LAB — CBC
HCT: 26.2 — ABNORMAL LOW
HCT: 27.5 — ABNORMAL LOW
Hemoglobin: 8.8 — ABNORMAL LOW
Hemoglobin: 9.2 — ABNORMAL LOW
MCHC: 33.3
MCHC: 33.5
MCV: 98.8
MCV: 99.5
Platelets: 157
Platelets: 160
RBC: 2.65 — ABNORMAL LOW
RBC: 2.77 — ABNORMAL LOW
RDW: 12.9
RDW: 12.9
WBC: 4.6
WBC: 5.1

## 2011-07-18 LAB — IRON AND TIBC
Iron: 32 — ABNORMAL LOW
Saturation Ratios: 14 — ABNORMAL LOW
TIBC: 233 — ABNORMAL LOW
UIBC: 201

## 2011-07-18 LAB — I-STAT 8, (EC8 V) (CONVERTED LAB)
Acid-base deficit: 3 — ABNORMAL HIGH
BUN: 34 — ABNORMAL HIGH
Bicarbonate: 19.9 — ABNORMAL LOW
Chloride: 108
Glucose, Bld: 122 — ABNORMAL HIGH
HCT: 37
Hemoglobin: 12.6
Operator id: 192351
Potassium: 4.7
Sodium: 137
TCO2: 21
pCO2, Ven: 27.6 — ABNORMAL LOW
pH, Ven: 7.467 — ABNORMAL HIGH

## 2011-07-18 LAB — STOOL CULTURE

## 2011-07-18 LAB — URINE CULTURE: Colony Count: 9000

## 2011-07-18 LAB — GIARDIA/CRYPTOSPORIDIUM SCREEN(EIA)
Cryptosporidium Screen (EIA): NEGATIVE
Giardia Screen - EIA: NEGATIVE

## 2011-07-18 LAB — CLOSTRIDIUM DIFFICILE EIA: C difficile Toxins A+B, EIA: NEGATIVE

## 2011-07-18 LAB — TSH: TSH: 1.044

## 2011-07-21 LAB — DIFFERENTIAL
Basophils Absolute: 0
Basophils Relative: 1
Eosinophils Absolute: 0.1
Eosinophils Relative: 2
Lymphocytes Relative: 23
Lymphs Abs: 1.5
Monocytes Absolute: 0.5
Monocytes Relative: 9
Neutro Abs: 4.2
Neutrophils Relative %: 66

## 2011-07-21 LAB — POCT CARDIAC MARKERS
CKMB, poc: 2.2
CKMB, poc: 2.4
CKMB, poc: 2.9
Myoglobin, poc: 111
Myoglobin, poc: 118
Myoglobin, poc: 125
Operator id: 284251
Operator id: 284251
Operator id: 284251
Troponin i, poc: <0.05
Troponin i, poc: <0.05
Troponin i, poc: <0.05

## 2011-07-21 LAB — COMPREHENSIVE METABOLIC PANEL
ALT: 22
AST: 27
Albumin: 4
Alkaline Phosphatase: 57
BUN: 16
CO2: 25
Calcium: 9.3
Chloride: 106
Creatinine, Ser: 1.14
GFR calc Af Amer: 58 — ABNORMAL LOW
GFR calc non Af Amer: 48 — ABNORMAL LOW
Glucose, Bld: 98
Potassium: 4.5
Sodium: 138
Total Bilirubin: 1
Total Protein: 6.9

## 2011-07-21 LAB — D-DIMER, QUANTITATIVE: D-Dimer, Quant: 0.6 — ABNORMAL HIGH

## 2011-07-21 LAB — CBC
HCT: 29.7 — ABNORMAL LOW
Hemoglobin: 10.2 — ABNORMAL LOW
MCHC: 34.3
MCV: 97
Platelets: 192
RBC: 3.06 — ABNORMAL LOW
RDW: 12.6
WBC: 6.4

## 2011-08-05 ENCOUNTER — Ambulatory Visit (INDEPENDENT_AMBULATORY_CARE_PROVIDER_SITE_OTHER)
Admission: RE | Admit: 2011-08-05 | Discharge: 2011-08-05 | Disposition: A | Discharge status: Home / Self Care | Payer: Medicare Other | Source: Ambulatory Visit | Attending: Family Medicine | Admitting: Family Medicine

## 2011-08-05 ENCOUNTER — Encounter: Payer: Self-pay | Admitting: Family Medicine

## 2011-08-05 ENCOUNTER — Ambulatory Visit (INDEPENDENT_AMBULATORY_CARE_PROVIDER_SITE_OTHER): Payer: Medicare Other | Admitting: Family Medicine

## 2011-08-05 VITALS — BP 128/82 | HR 76 | Temp 98.0°F | Ht 63.0 in | Wt 156.1 lb

## 2011-08-05 DIAGNOSIS — M79671 Pain in right foot: Secondary | ICD-10-CM

## 2011-08-05 DIAGNOSIS — G5790 Unspecified mononeuropathy of unspecified lower limb: Secondary | ICD-10-CM

## 2011-08-05 DIAGNOSIS — M79609 Pain in unspecified limb: Secondary | ICD-10-CM

## 2011-08-05 DIAGNOSIS — M792 Neuralgia and neuritis, unspecified: Secondary | ICD-10-CM

## 2011-08-05 DIAGNOSIS — M25579 Pain in unspecified ankle and joints of unspecified foot: Secondary | ICD-10-CM

## 2011-08-05 DIAGNOSIS — M25571 Pain in right ankle and joints of right foot: Secondary | ICD-10-CM

## 2011-08-05 DIAGNOSIS — Z23 Encounter for immunization: Secondary | ICD-10-CM

## 2011-08-05 MED ORDER — AMITRIPTYLINE HCL 25 MG PO TABS: 25.0000 mg | ORAL_TABLET | Freq: Every day | ORAL | Status: DC

## 2011-08-05 MED ORDER — DICLOFENAC SODIUM 75 MG PO TBEC: 75.0000 mg | DELAYED_RELEASE_TABLET | Freq: Two times a day (BID) | ORAL | Status: DC

## 2011-08-05 NOTE — Progress Notes (Signed)
Subjective:    Patient ID: Carla Lyons, female    DOB: 25-Jun-1941, 70 y.o.   MRN: [id number]  HPI  Carla Lyons, a 70 y.o. female presents today in the office for the following:    Pleasant patient who describes right-sided foot pain as well as tibial pain and the pain has been ongoing and worsening over the last 2 weeks. Primarily initially she was having pain in her foot, and the midfoot region, and she noticed some swelling there compared to the other foot. She has no trauma or accident she can recall. She also has developed some anteromedial knee swelling and some discomfort in this region as well, and she is walking with a limp. She describes the pain as dull and aching in the foot, but occasionally she will have some lightening-like strikes of intense pain intermittently 3-5 times daily.  Dorsum of right foot, but also running into her knee. Intermittently with some back pain, LBP. Sometimes will burn all the way around.   The PMH, PSH, Social History, Family History, Medications, and allergies have been reviewed in [facility], and have been updated if relevant.   Review of Systems REVIEW OF SYSTEMS  GEN: No fevers, chills. Nontoxic. Primarily MSK c/o today. MSK: Detailed in the HPI GI: tolerating PO intake without difficulty Neuro: detailed above. No weakness or numbness Otherwise the pertinent positives of the ROS are noted above.      Objective:   Physical Exam   Physical Exam  Blood pressure 128/82, pulse 76, temperature 98 F (36.7 C), temperature source Oral, height 5\' 3"  (1.6 m), weight 156 lb 1.9 oz (70.816 kg), SpO2 99.00%.  GEN: WDWN, NAD, Non-toxic, A & O x 3 HEENT: Atraumatic, Normocephalic. Neck supple. No masses, No LAD. Ears and Nose: No external deformity. EXTR: No c/c/e NEURO Normal gait.  PSYCH: Normally interactive. Conversant. Not depressed or anxious appearing.  Calm demeanor.   Right knee: Full range of motion. Extension to 0 and flexion to 120. Minimal  tenderness on the medial joint line. Nontender lateral joint line. Nontender with patellar facet loading. Notably tender at the anserine bursa region on the right. Negative Lachman. Negative MCL and LCL stress.  Right foot and ankle: Nontender at the lateral and medial malleoli. Nontender at the navicular and cuboid. Nontender the fifth metatarsal base and shaft. Nontender along the peroneals, posterior tibialis, and Achilles tendons. Nontender the plantar fascia.  Negative anterior drawer testing. Tender to palpation along the second, third, and fourth metatarsal shafts. Also notably tender and a palpable lump is present on the midfoot around the area of the third cuneiform.      Assessment & Plan:   1. Right foot pain  DG Foot Complete Right, DG Ankle Complete Right, diclofenac (VOLTAREN) 75 MG EC tablet  2. Right ankle pain  DG Foot Complete Right, DG Ankle Complete Right, diclofenac (VOLTAREN) 75 MG EC tablet  3. Neuropathic pain of foot  amitriptyline (ELAVIL) 25 MG tablet  4. Flu vaccine need  Flu vaccine greater than or equal to 3yo preservative free IM    X-ray, Ankle: AP, Lateral, and Mortise Views Indication: Ankle pain Findings: There is no evidence for acute fracture or dislocation. The mortise appears preserved. Heel spur  XR, 3 view, foot series Indication: foot pain Findings: no evidence of acute fracture or dislocation. Mild OA.   Metatarsal shaft pain and midfoot pain. Place the patient in a postoperative shoe for foot immobilization and recheck in one month. Cannot exclude stress  fracture versus stress reaction.  A component I suspect is also neuropathic pain, and we will try some low-dose Elavil

## 2011-08-05 NOTE — Patient Instructions (Signed)
Wear post-operative shoe  Amytriptiline 25 mg at night -- this is for the nerve pain  Recheck in 1 month with Dr. Patsy Lager

## 2011-08-06 ENCOUNTER — Ambulatory Visit: Payer: Medicare Other

## 2011-08-08 ENCOUNTER — Other Ambulatory Visit: Payer: Self-pay | Admitting: *Deleted

## 2011-08-08 MED ORDER — ALPRAZOLAM 0.25 MG PO TABS: 0.2500 mg | ORAL_TABLET | Freq: Every evening | ORAL | Status: DC | PRN

## 2011-08-08 NOTE — Telephone Encounter (Signed)
Last refill 04/23/2011.

## 2011-08-08 NOTE — Telephone Encounter (Signed)
Rx called to [facility]. 

## 2011-08-25 ENCOUNTER — Other Ambulatory Visit: Payer: Self-pay | Admitting: *Deleted

## 2011-09-02 ENCOUNTER — Encounter: Payer: Self-pay | Admitting: Family Medicine

## 2011-09-02 ENCOUNTER — Ambulatory Visit (INDEPENDENT_AMBULATORY_CARE_PROVIDER_SITE_OTHER): Payer: Medicare Other | Admitting: Family Medicine

## 2011-09-02 VITALS — BP 120/72 | HR 74 | Temp 98.4°F | Ht 63.0 in | Wt 159.8 lb

## 2011-09-02 DIAGNOSIS — IMO0002 Reserved for concepts with insufficient information to code with codable children: Secondary | ICD-10-CM

## 2011-09-02 DIAGNOSIS — M7050 Other bursitis of knee, unspecified knee: Secondary | ICD-10-CM

## 2011-09-02 DIAGNOSIS — M79609 Pain in unspecified limb: Secondary | ICD-10-CM

## 2011-09-02 DIAGNOSIS — M79671 Pain in right foot: Secondary | ICD-10-CM

## 2011-09-02 DIAGNOSIS — M792 Neuralgia and neuritis, unspecified: Secondary | ICD-10-CM

## 2011-09-02 DIAGNOSIS — G5790 Unspecified mononeuropathy of unspecified lower limb: Secondary | ICD-10-CM

## 2011-09-02 MED ORDER — AMITRIPTYLINE HCL 50 MG PO TABS: 50.0000 mg | ORAL_TABLET | Freq: Every day | ORAL | Status: DC

## 2011-09-02 NOTE — Progress Notes (Signed)
Patient Name: Carla Lyons Date of Birth: 1941-03-30 Age: 70 y.o. Medical Record Number: [id number] Gender: female  History of Present Illness:  Carla Lyons is a 70 y.o. very pleasant female patient who presents with the following:  Still having some pain in her foot.  Both foot and knee are hurting a lot.   Knee, this is primarily pain in the inner medial aspect just distal to the joint line. She has not had any significant swelling in the intra-articular space. She is having some swelling in the pes anserine area. No symptomatic giving way, no locking up of her joint.  She has been wearing her postoperative shoe for close to one month. She is having less pain in the metatarsal shaft, but the pain in her mid foot continues without any significant change. There is some mild degree of swelling. No bruising. X-rays of her foot and ankle have been normal.  Neuropathic pain sensations that she was feeling last time she was in the office had discontinued. We did place her on Elavil at 25 mg at nighttime, just tolerated this well.  08/05/2011 OV Pleasant patient who describes right-sided foot pain as well as tibial pain and the pain has been ongoing and worsening over the last 2 weeks. Primarily initially she was having pain in her foot, and the midfoot region, and she noticed some swelling there compared to the other foot. She has no trauma or accident she can recall. She also has developed some anteromedial knee swelling and some discomfort in this region as well, and she is walking with a limp. She describes the pain as dull and aching in the foot, but occasionally she will have some lightening-like strikes of intense pain intermittently 3-5 times daily.  Dorsum of right foot, but also running into her knee. Intermittently with some back pain, LBP. Sometimes will burn all the way around.    Past Medical History, Surgical History, Social History, Family History, and Problem List have been reviewed  in EHR and updated if relevant.  Review of Systems:  GEN: No fevers, chills. Nontoxic. Primarily MSK c/o today. MSK: Detailed in the HPI GI: tolerating PO intake without difficulty Neuro: above Otherwise the pertinent positives of the ROS are noted above.    Physical Examination: Filed Vitals:   09/02/11 1130  BP: 120/72  Pulse: 74  Temp: 98.4 F (36.9 C)  TempSrc: Oral  Height: 5\' 3"  (1.6 m)  Weight: 159 lb 12.8 oz (72.485 kg)  SpO2: 96%    GEN: WDWN, NAD, Non-toxic, A & O x 3 HEENT: Atraumatic, Normocephalic. Neck supple. No masses, No LAD. Ears and Nose: No external deformity. EXTR: No c/c/e NEURO Normal gait.   PSYCH: Normally interactive. Conversant. Not depressed or anxious appearing.  Calm demeanor.   Right knee: Full range of motion. Extension to 0 and flexion to 120. Minimal tenderness on the medial joint line. Nontender lateral joint line. Nontender with patellar facet loading. Notably tender at the anserine bursa region on the right. Negative Lachman. Negative MCL and LCL stress.  Right foot and ankle: Nontender at the lateral and medial malleoli. Mild tender at the navicular and NT cuboid. Nontender the fifth metatarsal base and shaft. Relatively diffusely in the midfoot medially, the patient has tenderness to palpation. She does have some tenderness at the Lisfranc joint Nontender along the peroneals, posterior tibialis, and Achilles tendons. Nontender the plantar fascia.  Negative anterior drawer testing. Nontender to palpation along the second, third, and fourth metatarsal shafts.  Assessment and Plan:  1. Foot pain, right  MR Foot Right Wo Contrast  2. Pes anserinus bursitis    3. Neuropathic pain of foot  amitriptyline (ELAVIL) 50 MG tablet   Inject anserine bursa. Neuropathic pain of the foot is improving, but still there, we'll increase the patient's Elavil.  Midfoot RIGHT pain, continues without improvement while in postoperative shoe and with  traditional conservative care. We'll obtain an MRI of the RIGHT foot to evaluate for any bony defect or other pathology causing continuous pain. Rule out stress fracture, stress reaction, midfoot ligamentous disruption.  Orders Placed This Encounter  Procedures  . MR Foot Right Wo Contrast    Standing Status: Future     Number of Occurrences:      Standing Expiration Date: 11/01/2012    159 LBS/NOT CLAUS/NO NEEDS INS MEDICARE& [employer]/SM/MARIANN    Order Specific Question:  Reason for exam:    Answer:  pain. 6 weeks, has been in post-op shoe    Order Specific Question:  Preferred imaging location?    Answer:  [id number]    Order Specific Question:  Does the patient have a pacemaker, internal devices, implants, aneury    Answer:  No     Medications Discontinued During This Encounter  Medication Reason  . amitriptyline (ELAVIL) 25 MG tablet Reorder     Pes Anserine Bursitis Injection, R Verbal consent was obtained. Risks (including rare infection, skin lightening, and potential atrophy), benefits, and alternatives explained. Sterilely prepped with Chloraprep. Ethyl chloride for anesthesia. Under sterile conditions, 1 cc of Lidocaine 1% and 1 cc of Depo-Medrol 40 mg injected directly on the pes anserinus perpendicularly taking the needle to bone then slightly withdrawing. No resistance encountered. No complications with procedure and tolerated well. Patient had decreased pain post-injection. 22 gauge 1 1/2 inch needle

## 2011-09-08 ENCOUNTER — Ambulatory Visit
Admission: RE | Admit: 2011-09-08 | Discharge: 2011-09-08 | Disposition: A | Discharge status: Home / Self Care | Payer: Medicare Other | Source: Ambulatory Visit | Attending: Family Medicine | Admitting: Family Medicine

## 2011-09-08 DIAGNOSIS — M79671 Pain in right foot: Secondary | ICD-10-CM

## 2011-09-10 ENCOUNTER — Ambulatory Visit (INDEPENDENT_AMBULATORY_CARE_PROVIDER_SITE_OTHER): Payer: Medicare Other | Admitting: Family Medicine

## 2011-09-10 ENCOUNTER — Encounter: Payer: Self-pay | Admitting: Family Medicine

## 2011-09-10 VITALS — BP 120/68 | HR 71 | Temp 98.4°F | Ht 63.0 in | Wt 157.1 lb

## 2011-09-10 DIAGNOSIS — M79673 Pain in unspecified foot: Secondary | ICD-10-CM

## 2011-09-10 DIAGNOSIS — M79609 Pain in unspecified limb: Secondary | ICD-10-CM

## 2011-09-10 NOTE — Progress Notes (Signed)
>  15 minutes spent in face to face time with patient, >50% spent in counselling or coordination of care: Patient is here in followup regarding her right foot pain. Wanted to review her MRI with her, and reexamine her foot. She is doing relatively well, she still has some neuropathic pain sensations on the top of her foot. She is still also having some mid foot pain. She has been wearing her postoperative shoe.  I reviewed her MRI of her foot, and is does show some significant mid foot osteoarthritic changes, and also shows as some T2 signal in the proximal metatarsal shaft of the second metatarsal, consistent with stress reaction. There is also some fluid in and around some of the TMT and mid foot joints.  I placed her in a Aircast Cam Walker boot, and we will keep her immobilized for 4 weeks, then recheck in the office.  RADIOLOGY REPORT*   Clinical Data: Foot pain for 6 weeks.   MRI OF THE RIGHT FOREFOOT WITHOUT CONTRAST   Technique:  Multiplanar, multisequence MR imaging was performed. No intravenous contrast was administered.   Comparison: Plain films of the foot and ankle 08/05/2011.   Findings: The patient has marked degenerative disease at the second tarsometatarsal joint where there is joint space narrowing and subchondral edema.  Marrow edema extends into the proximal diaphysis of the second metatarsal compatible with stress change. No discrete fracture is identified.  Also seen is a hallux valgus deformity with some first MTP osteoarthritis.  Small cystic lesion measuring 0.7 cm in diameter in the distal diaphysis of the first metatarsal has benign features.  Imaged bones are otherwise appear normal.  No focal fluid collection or mass is identified.  Soft tissue structures are unremarkable.   IMPRESSION:   1.  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  Edema extending into the proximal diaphysis of the second  metatarsal is consistent with stress change.  No fracture. 2.  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   Original Report Authenticated By: Bernadene Bell. Maricela Curet, M.D.

## 2011-09-10 NOTE — Patient Instructions (Signed)
F/u 4 weeks with Dr. Byren Pankow 

## 2011-10-08 ENCOUNTER — Encounter: Payer: Self-pay | Admitting: Family Medicine

## 2011-10-08 ENCOUNTER — Ambulatory Visit (INDEPENDENT_AMBULATORY_CARE_PROVIDER_SITE_OTHER): Payer: Medicare Other | Admitting: Family Medicine

## 2011-10-08 DIAGNOSIS — M79671 Pain in right foot: Secondary | ICD-10-CM

## 2011-10-08 DIAGNOSIS — M79609 Pain in unspecified limb: Secondary | ICD-10-CM

## 2011-10-08 NOTE — Progress Notes (Signed)
Patient Name: Carla Lyons Date of Birth: 07-03-1941 Age: 71 y.o. Medical Record Number: [id number] Gender: female Date of Encounter: 10/08/2011  History of Present Illness:  Carla Lyons is a 71 y.o. very pleasant female patient who presents with the following:  Has mostly been wearing CAM walker, about 80% better now. Neuropathic symptoms much better and the midfoot pain, but still present some  09/10/2011 OV >15 minutes spent in face to face time with patient, >50% spent in counselling or coordination of care: Patient is here in followup regarding her right foot pain. Wanted to review her MRI with her, and reexamine her foot. She is doing relatively well, she still has some neuropathic pain sensations on the top of her foot. She is still also having some mid foot pain. She has been wearing her postoperative shoe.  I reviewed her MRI of her foot, and is does show some significant mid foot osteoarthritic changes, and also shows as some T2 signal in the proximal metatarsal shaft of the second metatarsal, consistent with stress reaction. There is also some fluid in and around some of the TMT and mid foot joints.  I placed her in a Aircast Cam Walker boot, and we will keep her immobilized for 4 weeks, then recheck in the office.  RADIOLOGY REPORT*   Clinical Data: Foot pain for 6 weeks.   MRI OF THE RIGHT FOREFOOT WITHOUT CONTRAST   Technique:  Multiplanar, multisequence MR imaging was performed. No intravenous contrast was administered.   Comparison: Plain films of the foot and ankle 08/05/2011.   Findings: The patient has marked degenerative disease at the second tarsometatarsal joint where there is joint space narrowing and subchondral edema.  Marrow edema extends into the proximal diaphysis of the second metatarsal compatible with stress change. No discrete fracture is identified.  Also seen is a hallux valgus deformity with some first MTP osteoarthritis.  Small cystic  lesion measuring 0.7 cm in diameter in the distal diaphysis of the first metatarsal has benign features.  Imaged bones are otherwise appear normal.  No focal fluid collection or mass is identified.  Soft tissue structures are unremarkable.   IMPRESSION:   1.  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  Edema extending into the proximal diaphysis of the second metatarsal is consistent with stress change.  No fracture. 2.  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   Original Report Authenticated By: Bernadene Bell. Maricela Curet, M.D.   Past Medical History, Surgical History, Social History, Family History, Problem List, Medications, and Allergies have been reviewed and updated if relevant.  Review of Systems:  GEN: No fevers, chills. Nontoxic. Primarily MSK c/o today. MSK: Detailed in the HPI GI: tolerating PO intake without difficulty Neuro: detailed above Otherwise the pertinent positives of the ROS are noted above.   Physical Examination: Filed Vitals:   10/08/11 1038  BP: 120/78  Pulse: 82  Temp: 97.9 F (36.6 C)  TempSrc: Oral  Height: 5\' 3"  (1.6 m)  Weight: 161 lb 13 oz (73.397 kg)  SpO2: 100%    Body mass index is 28.66 kg/(m^2).   GEN: WDWN, NAD, Non-toxic, Alert & Oriented x 3 HEENT: Atraumatic, Normocephalic.  Ears and Nose: No external deformity. EXTR: No clubbing/cyanosis/edema NEURO: Normal gait.  PSYCH: Normally interactive. Conversant. Not depressed or anxious appearing.  Calm demeanor.   FEET: R Echymosis: no Edema: no ROM: full LE B MT pain: mild at proximal 2nd around TMT Callus pattern:  none Lateral Mall: NT Medial Mall: NT Talus: NT Navicular: NT Cuboid: NT Calcaneous: NT Metatarsals: NT 5th MT: NT Phalanges: NT Achilles: NT Plantar Fascia: NT Fat Pad: NT Peroneals: NT Post Tib: NT Great Toe: Nml motion Ant Drawer: neg ATFL: NT CFL: NT Deltoid:  NT Sensation: intact   Assessment and Plan: Foot pain, stress reaction: improving, CAM for 2 weeks, then wean out. Doing a lot better. Also cont elavil for neuropathic sx

## 2011-10-08 NOTE — Patient Instructions (Signed)
Recheck 1 month

## 2011-10-15 ENCOUNTER — Encounter: Payer: Self-pay | Admitting: Family Medicine

## 2011-10-15 ENCOUNTER — Other Ambulatory Visit: Payer: Self-pay | Admitting: Family Medicine

## 2011-10-15 ENCOUNTER — Ambulatory Visit (INDEPENDENT_AMBULATORY_CARE_PROVIDER_SITE_OTHER): Payer: Medicare Other | Admitting: Family Medicine

## 2011-10-15 VITALS — BP 106/72 | HR 60 | Temp 98.7°F | Wt 156.5 lb

## 2011-10-15 DIAGNOSIS — R252 Cramp and spasm: Secondary | ICD-10-CM

## 2011-10-15 DIAGNOSIS — E875 Hyperkalemia: Secondary | ICD-10-CM

## 2011-10-15 DIAGNOSIS — N179 Acute kidney failure, unspecified: Secondary | ICD-10-CM | POA: Insufficient documentation

## 2011-10-15 LAB — BASIC METABOLIC PANEL
BUN: 37 mg/dL — ABNORMAL HIGH (ref 6–23)
CO2: 27 mEq/L (ref 19–32)
Calcium: 9.5 mg/dL (ref 8.4–10.5)
Chloride: 104 mEq/L (ref 96–112)
Creatinine, Ser: 1.6 mg/dL — ABNORMAL HIGH (ref 0.4–1.2)
GFR: 32.96 mL/min — ABNORMAL LOW (ref >60.00)
Glucose, Bld: 73 mg/dL (ref 70–99)
Potassium: 5.5 mEq/L — ABNORMAL HIGH (ref 3.5–5.1)
Sodium: 139 mEq/L (ref 135–145)

## 2011-10-15 LAB — MAGNESIUM: Magnesium: 1.9 mg/dL (ref 1.5–2.5)

## 2011-10-15 NOTE — Patient Instructions (Signed)
Good to see you. We will call you with your lab results. Keep eating that mustard!

## 2011-10-15 NOTE — Progress Notes (Signed)
Carla Lyons is a 71 y.o. very pleasant female patient who presents for bilateral leg cramps.    Has been following with Dr. Patsy Lager for stress rx of right foot- weaning off CAM walker.  Symptoms in right foot approx 90% better.    Since she has been wearing the walker, she has had bilateral leg cramps, right ? Left.  Typically at night and in her calfs.  A few nights ago, cramps were in her left upper thigh.  She feels they are better when she eats bananas or mustard.  No LE weakness.   Patient Active Problem List  Diagnoses  . HYPOTHYROIDISM  . HYPERLIPIDEMIA, MIXED  . ANEMIA NOS  . DEPRESSION  . HYPERTENSION  . BRADYCARDIA  . LARYNGITIS, ACUTE  . CELLULITIS/ABSCESS, SITE NEC  . WRIST PAIN, RIGHT  . BACK PAIN, LUMBAR  . FOOT PAIN, RIGHT  . OSTEOPOROSIS  . CHEST DISCOMFORT  . WOUND, OPEN  . POSTMENOPAUSAL STATUS  . ALLERGIC RHINITIS CAUSE UNSPECIFIED  . Tick bite   Past Medical History  Diagnosis Date  . Colon polyps 06-15-07  . Hypertension   . Osteoporosis   . Allergy    Past Surgical History  Procedure Date  . Partial hysterectomy age 30  . Appendectomy   . Vaginal hysterectomy age 46    partial  . Cesarean section     x 2   History  Substance Use Topics  . Smoking status: Never Smoker   . Smokeless tobacco: Not on file  . Alcohol Use: No   Family History  Problem Relation Age of Onset  . Coronary artery disease Mother   . Other Mother     bypass surgery  . Osteoarthritis Father   . Hypertension Brother   . Heart attack Maternal Grandfather   . Lung disease Brother    Allergies  Allergen Reactions  . Codeine Phosphate     REACTION: Nausea/vomiting   Current Outpatient Prescriptions on File Prior to Visit  Medication Sig Dispense Refill  . ALPRAZolam (XANAX) 0.25 MG tablet Take 1 tablet (0.25 mg total) by mouth at bedtime as needed.  30 tablet  0  . amitriptyline (ELAVIL) 50 MG tablet Take 1 tablet (50 mg total) by mouth at bedtime.  30 tablet  2   . aspirin (ADULT ASPIRIN EC LOW STRENGTH) 81 MG EC tablet Take 81 mg by mouth 2 (two) times daily.        Marland Kitchen atorvastatin (LIPITOR) 20 MG tablet Take 1 tablet (20 mg total) by mouth daily.  30 tablet  11  . benazepril (LOTENSIN) 10 MG tablet Take 1 tablet (10 mg total) by mouth daily.  30 tablet  11  . Calcium Citrate-Vitamin D (CITRACAL/VITAMIN D) 250-200 MG-UNIT TABS Take by mouth.        . Cholecalciferol (VITAMIN D3) 1000 UNITS tablet Take 1,000 Units by mouth 2 (two) times daily.        . diclofenac (VOLTAREN) 75 MG EC tablet Take 1 tablet (75 mg total) by mouth 2 (two) times daily.  60 tablet  3  . ferrous sulfate ([facility] IRON) 325 (65 FE) MG tablet Take 325 mg by mouth daily.        . fluticasone (FLONASE) 50 MCG/ACT nasal spray 2 sprays by Nasal route. 2 sprays per nostril once daily       . levothyroxine (SYNTHROID, LEVOTHROID) 100 MCG tablet Take 1 tablet (100 mcg total) by mouth daily.  30 tablet  11  . loratadine (  CLARITIN) 10 MG tablet Take 10 mg by mouth daily. Take 1 by mouth daily       . multivitamin (THERAGRAN) per tablet Take 1 tablet by mouth daily.        . Omega-3 Fatty Acids (FISH OIL) 1000 MG CAPS Take by mouth. Take two daily       . valACYclovir (VALTREX) 1000 MG tablet Take two tablets by mouth every 12 hours  30 tablet  1  . vitamin B-12 (CYANOCOBALAMIN) 500 MCG tablet Take 500 mcg by mouth daily.         The PMH, PSH, Social History, Family History, Medications, and allergies have been reviewed in [facility], and have been updated if relevant.    Review of Systems:  GEN: No fevers, chills. Nontoxic. MSK: Detailed in the HPI  Physical Examination: BP 106/72  Pulse 60  Temp(Src) 98.7 F (37.1 C) (Oral)  Wt 156 lb 8 oz (70.988 kg)  GEN: WDWN, NAD, Non-toxic, Alert & Oriented x 3 HEENT: Atraumatic, Normocephalic.  Ears and Nose: No external deformity. EXTR: No clubbing/cyanosis/edema- wearing cam walker NEURO: Normal gait.  PSYCH: Normally interactive. Conversant.  Not depressed or anxious appearing.  Calm demeanor.    Assessment and Plan:  1. Leg cramps  Basic Metabolic Panel (BMET), Magnesium   New- likely from wearing brace and how her gait is compensating for it. Will check labs today to make sure electrolyte imbalance is not an issue. The patient indicates understanding of these issues and agrees with the plan.

## 2011-10-17 ENCOUNTER — Other Ambulatory Visit: Payer: Self-pay | Admitting: *Deleted

## 2011-10-17 MED ORDER — KAYEXALATE PO POWD: 15.0000 g | Freq: Once | ORAL | Status: DC

## 2011-10-20 ENCOUNTER — Other Ambulatory Visit (INDEPENDENT_AMBULATORY_CARE_PROVIDER_SITE_OTHER): Payer: Medicare Other

## 2011-10-20 DIAGNOSIS — N179 Acute kidney failure, unspecified: Secondary | ICD-10-CM

## 2011-10-20 DIAGNOSIS — E875 Hyperkalemia: Secondary | ICD-10-CM

## 2011-10-20 LAB — BASIC METABOLIC PANEL
BUN: 25 mg/dL — ABNORMAL HIGH (ref 6–23)
CO2: 24 mEq/L (ref 19–32)
Calcium: 8.7 mg/dL (ref 8.4–10.5)
Chloride: 104 mEq/L (ref 96–112)
Creatinine, Ser: 1.2 mg/dL (ref 0.4–1.2)
GFR: 49.15 mL/min — ABNORMAL LOW (ref >60.00)
Glucose, Bld: 93 mg/dL (ref 70–99)
Potassium: 4.5 mEq/L (ref 3.5–5.1)
Sodium: 136 mEq/L (ref 135–145)

## 2011-10-23 ENCOUNTER — Ambulatory Visit (INDEPENDENT_AMBULATORY_CARE_PROVIDER_SITE_OTHER): Payer: Medicare Other | Admitting: Family Medicine

## 2011-10-23 ENCOUNTER — Encounter: Payer: Self-pay | Admitting: Family Medicine

## 2011-10-23 VITALS — BP 110/62 | HR 68 | Temp 98.1°F | Ht 63.0 in | Wt 158.5 lb

## 2011-10-23 DIAGNOSIS — R252 Cramp and spasm: Secondary | ICD-10-CM

## 2011-10-23 DIAGNOSIS — E875 Hyperkalemia: Secondary | ICD-10-CM

## 2011-10-23 NOTE — Progress Notes (Signed)
Carla Lyons is a 71 y.o. very pleasant female patient who presents to discussed concerns about her hyperkalemia.  Saw her last week for bilateral leg cramps.  Potassium elevated at 5.4.  Given kayexelate 15 grams x 1 and potassium and renal function normalized.  Has been following with Dr. Patsy Lager for stress rx of right foot- weaning off CAM walker.  Symptoms in right foot approx 90% better.    No LE weakness, leg cramps have resolved.  She is concerned because she is not sure what caused this.  She was doing a slim fast diet to loose weight.  Has since stopped that.   Patient Active Problem List  Diagnoses  . HYPOTHYROIDISM  . HYPERLIPIDEMIA, MIXED  . ANEMIA NOS  . DEPRESSION  . HYPERTENSION  . BRADYCARDIA  . LARYNGITIS, ACUTE  . CELLULITIS/ABSCESS, SITE NEC  . WRIST PAIN, RIGHT  . BACK PAIN, LUMBAR  . FOOT PAIN, RIGHT  . OSTEOPOROSIS  . CHEST DISCOMFORT  . WOUND, OPEN  . POSTMENOPAUSAL STATUS  . ALLERGIC RHINITIS CAUSE UNSPECIFIED  . Tick bite  . Leg cramps  . Hyperkalemia  . ARF (acute renal failure)   Past Medical History  Diagnosis Date  . Colon polyps 06-15-07  . Hypertension   . Osteoporosis   . Allergy    Past Surgical History  Procedure Date  . Partial hysterectomy age 51  . Appendectomy   . Vaginal hysterectomy age 3    partial  . Cesarean section     x 2   History  Substance Use Topics  . Smoking status: Never Smoker   . Smokeless tobacco: Not on file  . Alcohol Use: No   Family History  Problem Relation Age of Onset  . Coronary artery disease Mother   . Other Mother     bypass surgery  . Osteoarthritis Father   . Hypertension Brother   . Heart attack Maternal Grandfather   . Lung disease Brother    Allergies  Allergen Reactions  . Codeine Phosphate     REACTION: Nausea/vomiting   Current Outpatient Prescriptions on File Prior to Visit  Medication Sig Dispense Refill  . ALPRAZolam (XANAX) 0.25 MG tablet Take 1 tablet (0.25 mg total)  by mouth at bedtime as needed.  30 tablet  0  . aspirin (ADULT ASPIRIN EC LOW STRENGTH) 81 MG EC tablet Take two tablets by mouth every morning      . atorvastatin (LIPITOR) 20 MG tablet Take 1 tablet (20 mg total) by mouth daily.  30 tablet  11  . benazepril (LOTENSIN) 10 MG tablet Take 1 tablet (10 mg total) by mouth daily.  30 tablet  11  . Calcium Citrate-Vitamin D (CITRACAL/VITAMIN D) 250-200 MG-UNIT TABS Take by mouth.        . levothyroxine (SYNTHROID, LEVOTHROID) 100 MCG tablet Take 1 tablet (100 mcg total) by mouth daily.  30 tablet  11  . multivitamin (THERAGRAN) per tablet Take 1 tablet by mouth daily.        . Omega-3 Fatty Acids (FISH OIL) 1000 MG CAPS Take by mouth. Take two daily       . valACYclovir (VALTREX) 1000 MG tablet Take two tablets by mouth every 12 hours  30 tablet  1   The PMH, PSH, Social History, Family History, Medications, and allergies have been reviewed in [facility], and have been updated if relevant.    Review of Systems:  GEN: No fevers, chills. Nontoxic. MSK: Detailed in the HPI  Physical  Examination: BP 110/62  Pulse 68  Temp(Src) 98.1 F (36.7 C) (Oral)  Wt 158 lb 8 oz (71.895 kg)  GEN: WDWN, NAD, Non-toxic, Alert & Oriented x 3 HEENT: Atraumatic, Normocephalic.  Ears and Nose: No external deformity. EXTR: No clubbing/cyanosis/edema- wearing cam walker NEURO: Normal gait.  PSYCH: Normally interactive. Conversant. Not depressed or anxious appearing.  Calm demeanor.    Assessment and Plan:  1. Hyperkalemia  Improved. >25 min spent with face to face with patient, >50% counseling and/or coordinating care Looked at nutritional info for Slim fast shakes- 17% of daily potassium in one bottle and she has been drinking two a day and eating a lot of baked potatoes.  This is likely the cause.  Also discussed that she was dehydrated when we checked her labs last week. She will increase water consumption.  2. Leg cramps  Improved. See above.

## 2011-11-12 ENCOUNTER — Encounter: Payer: Self-pay | Admitting: Family Medicine

## 2011-11-12 ENCOUNTER — Ambulatory Visit (INDEPENDENT_AMBULATORY_CARE_PROVIDER_SITE_OTHER): Payer: Medicare Other | Admitting: Family Medicine

## 2011-11-12 VITALS — BP 112/70 | HR 70 | Temp 98.0°F | Ht 63.0 in | Wt 155.8 lb

## 2011-11-12 DIAGNOSIS — M79673 Pain in unspecified foot: Secondary | ICD-10-CM

## 2011-11-12 DIAGNOSIS — M79609 Pain in unspecified limb: Secondary | ICD-10-CM

## 2011-11-12 DIAGNOSIS — F430 Acute stress reaction: Secondary | ICD-10-CM

## 2011-11-12 NOTE — Progress Notes (Signed)
  Patient Name: Carla Lyons Date of Birth: Apr 03, 1941 Age: 71 y.o. Medical Record Number: [id number] Gender: female Date of Encounter: 11/12/2011  History of Present Illness:  Carla Lyons is a 71 y.o. very pleasant female patient who presents with the following:  Followup right-sided foot pain and stress reaction. The patient's symptoms have resolved and she is pain-free at this point. She has been out of her Cam Walker for the last 2-3 weeks. No complaints at this time.  Past Medical History, Surgical History, Social History, Family History, Problem List, Medications, and Allergies have been reviewed and updated if relevant.  Review of Systems:  GEN: No fevers, chills. Nontoxic. Primarily MSK c/o today. MSK: Detailed in the HPI GI: tolerating PO intake without difficulty Neuro: No numbness, parasthesias, or tingling associated. Otherwise the pertinent positives of the ROS are noted above.    Physical Examination: Filed Vitals:   11/12/11 0922  BP: 112/70  Pulse: 70  Temp: 98 F (36.7 C)  TempSrc: Oral  Height: 5\' 3"  (1.6 m)  Weight: 155 lb 12.8 oz (70.67 kg)  SpO2: 98%    Body mass index is 27.60 kg/(m^2).   GEN: WDWN, NAD, Non-toxic, Alert & Oriented x 3 HEENT: Atraumatic, Normocephalic.  Ears and Nose: No external deformity. EXTR: No clubbing/cyanosis/edema NEURO: Normal gait.  PSYCH: Normally interactive. Conversant. Not depressed or anxious appearing.  Calm demeanor.   FEET: r Echymosis: no Edema: no ROM: full LE B Gait: heel toe, non-antalgic MT pain: no Callus pattern: none Lateral Mall: NT Medial Mall: NT Talus: NT Navicular: NT Cuboid: NT Calcaneous: NT Metatarsals: NT 5th MT: NT Phalanges: NT Achilles: NT Plantar Fascia: NT Fat Pad: NT Peroneals: NT Post Tib: NT Great Toe: Nml motion Ant Drawer: neg ATFL: NT CFL: NT Deltoid: NT Sensation: intact   Assessment and Plan: Stress reaction and foot pain: Improved. Followup when necessary

## 2011-11-28 ENCOUNTER — Other Ambulatory Visit: Payer: Self-pay

## 2011-11-28 MED ORDER — ALPRAZOLAM 0.25 MG PO TABS: 0.2500 mg | ORAL_TABLET | Freq: Every evening | ORAL | Status: DC | PRN

## 2011-11-28 NOTE — Telephone Encounter (Signed)
Received fax refill request from patients pharmacy.  Okay to refill? 

## 2011-11-28 NOTE — Telephone Encounter (Signed)
plz phone in. 

## 2011-12-01 NOTE — Telephone Encounter (Signed)
Rx called in as directed.   

## 2011-12-17 ENCOUNTER — Encounter: Payer: Self-pay | Admitting: Family Medicine

## 2011-12-18 ENCOUNTER — Encounter: Payer: Self-pay | Admitting: *Deleted

## 2011-12-18 ENCOUNTER — Encounter: Payer: Self-pay | Admitting: Family Medicine

## 2011-12-18 LAB — HM MAMMOGRAPHY: HM Mammogram: NORMAL

## 2012-01-05 ENCOUNTER — Other Ambulatory Visit: Payer: Self-pay | Admitting: *Deleted

## 2012-01-05 MED ORDER — ATORVASTATIN CALCIUM 20 MG PO TABS: 20.0000 mg | ORAL_TABLET | Freq: Every day | ORAL | Status: DC

## 2012-01-05 MED ORDER — BENAZEPRIL HCL 10 MG PO TABS: 10.0000 mg | ORAL_TABLET | Freq: Every day | ORAL | Status: DC

## 2012-01-16 ENCOUNTER — Ambulatory Visit: Payer: Medicare Other | Admitting: Family Medicine

## 2012-01-19 ENCOUNTER — Ambulatory Visit (INDEPENDENT_AMBULATORY_CARE_PROVIDER_SITE_OTHER): Payer: Medicare Other | Admitting: Family Medicine

## 2012-01-19 ENCOUNTER — Encounter: Payer: Self-pay | Admitting: Family Medicine

## 2012-01-19 VITALS — BP 140/72 | HR 64 | Temp 98.1°F

## 2012-01-19 DIAGNOSIS — I10 Essential (primary) hypertension: Secondary | ICD-10-CM

## 2012-01-19 DIAGNOSIS — E782 Mixed hyperlipidemia: Secondary | ICD-10-CM

## 2012-01-19 DIAGNOSIS — E875 Hyperkalemia: Secondary | ICD-10-CM

## 2012-01-19 DIAGNOSIS — E039 Hypothyroidism, unspecified: Secondary | ICD-10-CM

## 2012-01-19 LAB — COMPREHENSIVE METABOLIC PANEL
ALT: 15 U/L (ref 0–35)
AST: 22 U/L (ref 0–37)
Albumin: 4.2 g/dL (ref 3.5–5.2)
Alkaline Phosphatase: 45 U/L (ref 39–117)
BUN: 18 mg/dL (ref 6–23)
CO2: 26 mEq/L (ref 19–32)
Calcium: 9.6 mg/dL (ref 8.4–10.5)
Chloride: 108 mEq/L (ref 96–112)
Creatinine, Ser: 1.1 mg/dL (ref 0.4–1.2)
GFR: 50.11 mL/min — ABNORMAL LOW (ref >60.00)
Glucose, Bld: 92 mg/dL (ref 70–99)
Potassium: 4.8 mEq/L (ref 3.5–5.1)
Sodium: 142 mEq/L (ref 135–145)
Total Bilirubin: 0.6 mg/dL (ref 0.3–1.2)
Total Protein: 7.2 g/dL (ref 6.0–8.3)

## 2012-01-19 LAB — TSH: TSH: 1.08 u[IU]/mL (ref 0.35–5.50)

## 2012-01-19 LAB — T4, FREE: Free T4: 1.03 ng/dL (ref 0.60–1.60)

## 2012-01-19 MED ORDER — BENAZEPRIL HCL 10 MG PO TABS: 10.0000 mg | ORAL_TABLET | Freq: Every day | ORAL | Status: DC

## 2012-01-19 MED ORDER — ATORVASTATIN CALCIUM 20 MG PO TABS: 20.0000 mg | ORAL_TABLET | Freq: Every day | ORAL | Status: DC

## 2012-01-19 NOTE — Patient Instructions (Signed)
Great to see you.  We will call you with your lab results. 

## 2012-01-19 NOTE — Progress Notes (Signed)
71 yo here for med refills.  Cholesterol- stable on Lipitor 20 mg daily.  Switched to generic a few months ago.  Denies any side effects.   Lab Results  Component Value Date   CHOL 167 05/28/2011   HDL 58 05/28/2011   LDLCALC 83 05/28/2011   TRIG 130 05/28/2011   CHOLHDL 2.9 05/28/2011    Hypothyroidism- denies any symptoms of hypo or hyper thyroidism. TSH wnl, on Levothroid 100 mch daily.   Lab Results  Component Value Date   TSH 1.504 05/27/2011   HTN- stable on Benazapril 10 mg daily. Has been losing weight by cutting calories and keeping a food journal.  Hyperkalemia- resolved, thought to be secondary to slim fast shakes which she no longer takes. Lab Results  Component Value Date   NA 136 10/20/2011   K 4.5 10/20/2011   CL 104 10/20/2011   CO2 24 10/20/2011   Patient Active Problem List  Diagnoses  . HYPOTHYROIDISM  . HYPERLIPIDEMIA, MIXED  . ANEMIA NOS  . DEPRESSION  . HYPERTENSION  . BRADYCARDIA  . LARYNGITIS, ACUTE  . CELLULITIS/ABSCESS, SITE NEC  . WRIST PAIN, RIGHT  . BACK PAIN, LUMBAR  . FOOT PAIN, RIGHT  . OSTEOPOROSIS  . CHEST DISCOMFORT  . WOUND, OPEN  . POSTMENOPAUSAL STATUS  . ALLERGIC RHINITIS CAUSE UNSPECIFIED  . Tick bite  . Leg cramps  . Hyperkalemia  . ARF (acute renal failure)   Past Medical History  Diagnosis Date  . Colon polyps 06-15-07  . Hypertension   . Osteoporosis   . Allergy    Past Surgical History  Procedure Date  . Partial hysterectomy age 45  . Appendectomy   . Vaginal hysterectomy age 2    partial  . Cesarean section     x 2   History  Substance Use Topics  . Smoking status: Former Games developer  . Smokeless tobacco: Not on file  . Alcohol Use: No   Family History  Problem Relation Age of Onset  . Coronary artery disease Mother   . Other Mother     bypass surgery  . Osteoarthritis Father   . Hypertension Brother   . Heart attack Maternal Grandfather   . Lung disease Brother    Allergies  Allergen Reactions  .  Codeine Phosphate     REACTION: Nausea/vomiting   Current Outpatient Prescriptions on File Prior to Visit  Medication Sig Dispense Refill  . ALPRAZolam (XANAX) 0.25 MG tablet Take 1 tablet (0.25 mg total) by mouth at bedtime as needed.  30 tablet  0  . aspirin (ADULT ASPIRIN EC LOW STRENGTH) 81 MG EC tablet Take two tablets by mouth every morning      . Calcium Citrate-Vitamin D (CITRACAL/VITAMIN D) 250-200 MG-UNIT TABS Take by mouth.        . levothyroxine (SYNTHROID, LEVOTHROID) 100 MCG tablet Take 1 tablet (100 mcg total) by mouth daily.  30 tablet  11  . multivitamin (THERAGRAN) per tablet Take 1 tablet by mouth daily.        . Omega-3 Fatty Acids (FISH OIL) 1000 MG CAPS Take by mouth. Take two daily       . valACYclovir (VALTREX) 1000 MG tablet Take two tablets by mouth every 12 hours  30 tablet  1  . DISCONTD: atorvastatin (LIPITOR) 20 MG tablet Take 1 tablet (20 mg total) by mouth daily.  30 tablet  1  . DISCONTD: benazepril (LOTENSIN) 10 MG tablet Take 1 tablet (10 mg total) by mouth  daily.  30 tablet  1   The PMH, PSH, Social History, Family History, Medications, and allergies have been reviewed in [facility], and have been updated if relevant.  ROS: See HPI Patient reports no  vision/ hearing changes,anorexia, weight change, fever ,adenopathy, persistant / recurrent hoarseness, swallowing issues, chest pain, edema,persistant / recurrent cough, hemoptysis, dyspnea(rest, exertional, paroxysmal nocturnal), gastrointestinal  bleeding (melena, rectal bleeding), abdominal pain, excessive heart burn, GU symptoms(dysuria, hematuria, pyuria, voiding/incontinence  Issues) syncope, focal weakness, severe memory loss, concerning skin lesions, depression, anxiety, abnormal bruising/bleeding, major joint swelling, breast masses or abnormal vaginal bleeding.    Physical exam: BP 140/72  Pulse 64  Temp 98.1 F (36.7 C) Wt Readings from Last 3 Encounters:  11/12/11 155 lb 12.8 oz (70.67 kg)  10/23/11  158 lb 8 oz (71.895 kg)  10/15/11 156 lb 8 oz (70.988 kg)   General:  Well-developed,well-nourished,in no acute distress; alert,appropriate and cooperative throughout examination Head:  normocephalic and atraumatic.   Eyes:  vision grossly intact, pupils equal, pupils round, and pupils reactive to light.   Ears:  R ear normal and L ear normal.   Nose:  no external deformity.   Mouth:  good dentition.   Lungs:  Normal respiratory effort, chest expands symmetrically. Lungs are clear to auscultation, no crackles or wheezes. Heart:  Normal rate and regular rhythm. S1 and S2 normal without gallop, murmur, click, rub or other extra sounds. Abdomen:  Bowel sounds positive,abdomen soft and non-tender without masses, organomegaly or hernias noted. Msk:  No deformity or scoliosis noted of thoracic or lumbar spine.   Extremities:  No clubbing, cyanosis, edema, or deformity noted with normal full range of motion of all joints.   Neurologic:  alert & oriented X3 and gait normal.   Skin:  Intact without suspicious lesions or rashes Psych:  Cognition and judgment appear intact. Alert and cooperative with normal attention span and concentration. No apparent delusions, illusions, hallucinations  Assessment and Plan: 1. HYPERTENSION  Mildly elevated- did not take meds yet today. Overall, stable. Check CMET today. Comprehensive metabolic panel  2. HYPERLIPIDEMIA, MIXED Stable.    3. Hyperkalemia  Resolved, recheck CMET today.   4. HYPOTHYROIDISM  Stable, recheck thyroid panel today. TSH, T4, Free

## 2012-03-09 ENCOUNTER — Other Ambulatory Visit: Payer: Self-pay | Admitting: *Deleted

## 2012-03-09 MED ORDER — ALPRAZOLAM 0.25 MG PO TABS: 0.2500 mg | ORAL_TABLET | Freq: Every evening | ORAL | Status: DC | PRN

## 2012-03-09 NOTE — Telephone Encounter (Signed)
Called to [facility]. 

## 2012-03-09 NOTE — Telephone Encounter (Signed)
OK to refill? Please send to [facility]. 

## 2012-04-13 ENCOUNTER — Other Ambulatory Visit: Payer: Self-pay | Admitting: *Deleted

## 2012-04-13 ENCOUNTER — Telehealth: Payer: Self-pay | Admitting: Family Medicine

## 2012-04-13 MED ORDER — LEVOTHYROXINE SODIUM 100 MCG PO TABS: 100.0000 ug | ORAL_TABLET | Freq: Every day | ORAL | Status: DC

## 2012-04-13 NOTE — Telephone Encounter (Signed)
Script sent to [facility]. 

## 2012-04-13 NOTE — Telephone Encounter (Signed)
Patient walked in office to ask about her thyroid medication refills.  She had two other prescriptions requested at the same time and they went through and wasn't sure why the thyroid medication was not refilled.  She asked if there were problems with her thyroid lab results.  Herbert Seta came out to talk to the patient and explained that her lab results were fine and that she would not need thyroid labs again until next year.  She explained to the patient that she just sent a refill in for a 30 day supply and that she should check with the pharmacy.  Heather explained to the patient that the 1 yr supply would be sent in by Dr. Elmer Sow CMA.

## 2012-08-23 ENCOUNTER — Other Ambulatory Visit: Payer: Self-pay

## 2012-08-23 MED ORDER — ALPRAZOLAM 0.25 MG PO TABS: 0.2500 mg | ORAL_TABLET | Freq: Every evening | ORAL | Status: DC | PRN

## 2012-08-23 NOTE — Telephone Encounter (Signed)
Medicine refill called to [facility].

## 2012-08-23 NOTE — Telephone Encounter (Signed)
[facility] faxed refill for alprazolam.last filled 03/09/12.Please advise.

## 2012-12-21 ENCOUNTER — Encounter: Payer: Self-pay | Admitting: Family Medicine

## 2012-12-23 ENCOUNTER — Other Ambulatory Visit: Payer: Self-pay | Admitting: *Deleted

## 2012-12-23 MED ORDER — ALPRAZOLAM 0.25 MG PO TABS: 0.2500 mg | ORAL_TABLET | Freq: Every evening | ORAL | Status: DC | PRN

## 2012-12-23 NOTE — Telephone Encounter (Signed)
Last filled 08/23/2012

## 2012-12-24 NOTE — Telephone Encounter (Signed)
Medicine called to [facility]. 

## 2013-01-03 ENCOUNTER — Encounter: Payer: Self-pay | Admitting: Family Medicine

## 2013-01-03 ENCOUNTER — Ambulatory Visit (INDEPENDENT_AMBULATORY_CARE_PROVIDER_SITE_OTHER): Payer: Medicare Other | Admitting: Family Medicine

## 2013-01-03 VITALS — BP 140/84 | HR 72 | Temp 98.1°F | Wt 160.0 lb

## 2013-01-03 DIAGNOSIS — M79671 Pain in right foot: Secondary | ICD-10-CM

## 2013-01-03 DIAGNOSIS — M79609 Pain in unspecified limb: Secondary | ICD-10-CM

## 2013-01-03 DIAGNOSIS — R609 Edema, unspecified: Secondary | ICD-10-CM | POA: Insufficient documentation

## 2013-01-03 LAB — COMPREHENSIVE METABOLIC PANEL
ALT: 22 U/L (ref 0–35)
AST: 23 U/L (ref 0–37)
Albumin: 3.9 g/dL (ref 3.5–5.2)
Alkaline Phosphatase: 46 U/L (ref 39–117)
BUN: 20 mg/dL (ref 6–23)
CO2: 27 mEq/L (ref 19–32)
Calcium: 9 mg/dL (ref 8.4–10.5)
Chloride: 101 mEq/L (ref 96–112)
Creatinine, Ser: 1.2 mg/dL (ref 0.4–1.2)
GFR: 45.77 mL/min — ABNORMAL LOW (ref >60.00)
Glucose, Bld: 86 mg/dL (ref 70–99)
Potassium: 4.6 mEq/L (ref 3.5–5.1)
Sodium: 139 mEq/L (ref 135–145)
Total Bilirubin: 0.7 mg/dL (ref 0.3–1.2)
Total Protein: 7 g/dL (ref 6.0–8.3)

## 2013-01-03 LAB — CBC WITH DIFFERENTIAL/PLATELET
Basophils Absolute: 0.1 10*3/uL (ref 0.0–0.1)
Basophils Relative: 0.7 % (ref 0.0–3.0)
Eosinophils Absolute: 0.2 10*3/uL (ref 0.0–0.7)
Eosinophils Relative: 3 % (ref 0.0–5.0)
HCT: 31.6 % — ABNORMAL LOW (ref 36.0–46.0)
Hemoglobin: 10.4 g/dL — ABNORMAL LOW (ref 12.0–15.0)
Lymphocytes Relative: 20.9 % (ref 12.0–46.0)
Lymphs Abs: 1.6 10*3/uL (ref 0.7–4.0)
MCHC: 33 g/dL (ref 30.0–36.0)
MCV: 100.4 fl — ABNORMAL HIGH (ref 78.0–100.0)
Monocytes Absolute: 0.7 10*3/uL (ref 0.1–1.0)
Monocytes Relative: 9.6 % (ref 3.0–12.0)
Neutro Abs: 5 10*3/uL (ref 1.4–7.7)
Neutrophils Relative %: 65.8 % (ref 43.0–77.0)
Platelets: 216 10*3/uL (ref 150.0–400.0)
RBC: 3.14 Mil/uL — ABNORMAL LOW (ref 3.87–5.11)
RDW: 13.3 % (ref 11.5–14.6)
WBC: 7.7 10*3/uL (ref 4.5–10.5)

## 2013-01-03 LAB — T4, FREE: Free T4: 1.05 ng/dL (ref 0.60–1.60)

## 2013-01-03 LAB — TSH: TSH: 1.37 u[IU]/mL (ref 0.35–5.50)

## 2013-01-03 LAB — BRAIN NATRIURETIC PEPTIDE: Pro B Natriuretic peptide (BNP): 43 pg/mL (ref 0.0–100.0)

## 2013-01-03 NOTE — Patient Instructions (Addendum)
Let's start out with some blood work today. We will call you with tomorrow with your results.

## 2013-01-03 NOTE — Progress Notes (Signed)
72 yo female with h/o hypothyroidism and LE neuropathy here for pain and swelling in her bilateral feet and legs x 4 days.  Swelling and pain are worse at the end of the day.    No known injury.  She has not been as active lately- moved in with her 49 year old mother to help care for her. No CP or SOB.  Pain in feet seems to be "all over and runs up to my knees."  Pain is not worse with exertion.  Hypothyroidism- denies any symptoms of hypo or hyper thyroidism. On Levothroid 100 mch daily.   Lab Results  Component Value Date   TSH 1.08 01/19/2012   HTN- stable on Benazapril 10 mg daily.   Patient Active Problem List  Diagnosis  . HYPOTHYROIDISM  . HYPERLIPIDEMIA, MIXED  . ANEMIA NOS  . DEPRESSION  . HYPERTENSION  . BRADYCARDIA  . LARYNGITIS, ACUTE  . CELLULITIS/ABSCESS, SITE NEC  . WRIST PAIN, RIGHT  . BACK PAIN, LUMBAR  . Foot pain, bilateral  . OSTEOPOROSIS  . CHEST DISCOMFORT  . WOUND, OPEN  . POSTMENOPAUSAL STATUS  . ALLERGIC RHINITIS CAUSE UNSPECIFIED  . Tick bite  . Leg cramps  . Hyperkalemia  . ARF (acute renal failure)  . Edema   Past Medical History  Diagnosis Date  . Colon polyps 06-15-07  . Hypertension   . Osteoporosis   . Allergy    Past Surgical History  Procedure Laterality Date  . Partial hysterectomy  age 65  . Appendectomy    . Vaginal hysterectomy  age 49    partial  . Cesarean section      x 2   History  Substance Use Topics  . Smoking status: Former Games developer  . Smokeless tobacco: Not on file  . Alcohol Use: No   Family History  Problem Relation Age of Onset  . Coronary artery disease Mother   . Other Mother     bypass surgery  . Osteoarthritis Father   . Hypertension Brother   . Heart attack Maternal Grandfather   . Lung disease Brother    Allergies  Allergen Reactions  . Codeine Phosphate     REACTION: Nausea/vomiting   Current Outpatient Prescriptions on File Prior to Visit  Medication Sig Dispense Refill  . ALPRAZolam  (XANAX) 0.25 MG tablet Take 1 tablet (0.25 mg total) by mouth at bedtime as needed.  30 tablet  0  . aspirin (ADULT ASPIRIN EC LOW STRENGTH) 81 MG EC tablet Take two tablets by mouth every morning      . atorvastatin (LIPITOR) 20 MG tablet Take 1 tablet (20 mg total) by mouth daily.  30 tablet  11  . benazepril (LOTENSIN) 10 MG tablet Take 1 tablet (10 mg total) by mouth daily.  30 tablet  11  . Calcium Citrate-Vitamin D (CITRACAL/VITAMIN D) 250-200 MG-UNIT TABS Take by mouth.        . levothyroxine (SYNTHROID, LEVOTHROID) 100 MCG tablet Take 1 tablet (100 mcg total) by mouth daily.  30 tablet  10  . multivitamin (THERAGRAN) per tablet Take 1 tablet by mouth daily.        . Omega-3 Fatty Acids (FISH OIL) 1000 MG CAPS Take by mouth. Take two daily       . valACYclovir (VALTREX) 1000 MG tablet Take two tablets by mouth every 12 hours  30 tablet  1   No current facility-administered medications on file prior to visit.   The PMH, PSH, Social History,  Family History, Medications, and allergies have been reviewed in [facility], and have been updated if relevant.  ROS: See HPI +fatigue No LE weakness Physical exam: BP 140/84  Pulse 72  Temp(Src) 98.1 F (36.7 C)  Wt 160 lb (72.576 kg)  BMI 28.35 kg/m2  General:  Well-developed,well-nourished,in no acute distress; alert,appropriate and cooperative throughout examination Head:  normocephalic and atraumatic.   Eyes:  vision grossly intact, pupils equal, pupils round, and pupils reactive to light.   Ears:  R ear normal and L ear normal.   Nose:  no external deformity.   Mouth:  good dentition.   Lungs:  Normal respiratory effort, chest expands symmetrically. Lungs are clear to auscultation, no crackles or wheezes. Heart:  Normal rate and regular rhythm. S1 and S2 normal without gallop, murmur, click, rub or other extra sounds. Abdomen:  Bowel sounds positive,abdomen soft and non-tender without masses, organomegaly or hernias noted. Msk:  No  deformity or scoliosis noted of thoracic or lumbar spine.   NO edema bilateral extremities Extremities:  No clubbing, cyanosis, edema, or deformity noted with normal full range of motion of all joints.   Neurologic:  alert & oriented X3 and gait normal.   Skin:  Intact without suspicious lesions or rashes Psych:  Cognition and judgment appear intact. Alert and cooperative with normal attention span and concentration. No apparent delusions, illusions, hallucinations  Assessment and Plan: 1. Foot pain, bilateral I suspect she has a component of arthritis and neuropathic pain.  See below. If labs unremarkable, will start low dose gabapentin, refer back to Dr. Patsy Lager.  2. Edema Dependent- no edema on exam today. Lungs clear- will check labs today.   - TSH - T4, Free - Brain natriuretic peptide - Comprehensive metabolic panel - CBC with Differential

## 2013-01-04 ENCOUNTER — Other Ambulatory Visit: Payer: Self-pay | Admitting: Family Medicine

## 2013-01-04 DIAGNOSIS — D539 Nutritional anemia, unspecified: Secondary | ICD-10-CM

## 2013-01-05 ENCOUNTER — Other Ambulatory Visit: Payer: Self-pay | Admitting: Family Medicine

## 2013-01-05 ENCOUNTER — Other Ambulatory Visit (INDEPENDENT_AMBULATORY_CARE_PROVIDER_SITE_OTHER): Payer: Medicare Other

## 2013-01-05 ENCOUNTER — Encounter: Payer: Self-pay | Admitting: Family Medicine

## 2013-01-05 ENCOUNTER — Other Ambulatory Visit: Payer: Medicare Other

## 2013-01-05 ENCOUNTER — Ambulatory Visit (INDEPENDENT_AMBULATORY_CARE_PROVIDER_SITE_OTHER): Payer: Medicare Other | Admitting: Family Medicine

## 2013-01-05 VITALS — BP 140/84 | HR 77 | Temp 98.1°F | Ht 63.0 in | Wt 160.0 lb

## 2013-01-05 DIAGNOSIS — D539 Nutritional anemia, unspecified: Secondary | ICD-10-CM

## 2013-01-05 DIAGNOSIS — M79671 Pain in right foot: Secondary | ICD-10-CM

## 2013-01-05 DIAGNOSIS — D649 Anemia, unspecified: Secondary | ICD-10-CM

## 2013-01-05 DIAGNOSIS — M79609 Pain in unspecified limb: Secondary | ICD-10-CM

## 2013-01-05 DIAGNOSIS — M7672 Peroneal tendinitis, left leg: Secondary | ICD-10-CM

## 2013-01-05 DIAGNOSIS — M7750 Other enthesopathy of unspecified foot: Secondary | ICD-10-CM

## 2013-01-05 LAB — IBC PANEL
Iron: 81 ug/dL (ref 42–145)
Saturation Ratios: 21.5 % (ref 20.0–50.0)
Transferrin: 268.7 mg/dL (ref 212.0–360.0)

## 2013-01-05 LAB — B12 AND FOLATE PANEL
Folate: 15.7 ng/mL (ref >5.9)
Vitamin B-12: 333 pg/mL (ref 211–911)

## 2013-01-05 LAB — FERRITIN: Ferritin: 15 ng/mL (ref 10.0–291.0)

## 2013-01-05 NOTE — Patient Instructions (Addendum)
  SHOES: Danskos, [place]rells, [place], [place] - good arch support, want minimal bendability  Shoes: Birkenstock shoes, [employer] THE [place], [address], [place], [place]

## 2013-01-05 NOTE — Progress Notes (Signed)
[facility] [address] [place] [address] Phone: [phone] Fax: [phone]  Date:  01/05/2013   Name:  Carla Lyons   DOB:  Sep 19, 1941   MRN:  [id number] Gender: female Age: 72 y.o.  Primary Physician:  Ruthe Mannan, MD  Evaluating MD: Hannah Beat, MD   Chief Complaint: Foot Pain   History of Present Illness:  Carla Lyons is a 72 y.o. pleasant patient who presents with the following:  L 5th MT.  This is a patient who I remember well, who a year or so ago had 2 placed in a Cam Walker boot due to some stress reaction/fracture in her RIGHT foot. She ultimately did well over an extended period of immobilization.  Today she presents with pain in bilateral feet. Some pain in the mid foot bilaterally, and some significant pain in the lateral aspect of the LEFT foot. On the RIGHT foot it is more grossly in the mid foot without any specific point, though she does have some pain in the Lisfranc joint but no trauma history.  Patient Active Problem List  Diagnosis  . HYPOTHYROIDISM  . HYPERLIPIDEMIA, MIXED  . ANEMIA NOS  . DEPRESSION  . HYPERTENSION  . BRADYCARDIA  . LARYNGITIS, ACUTE  . CELLULITIS/ABSCESS, SITE NEC  . WRIST PAIN, RIGHT  . BACK PAIN, LUMBAR  . Foot pain, bilateral  . OSTEOPOROSIS  . CHEST DISCOMFORT  . WOUND, OPEN  . POSTMENOPAUSAL STATUS  . ALLERGIC RHINITIS CAUSE UNSPECIFIED  . Tick bite  . Leg cramps  . Hyperkalemia  . ARF (acute renal failure)  . Edema    Past Medical History  Diagnosis Date  . Colon polyps 06-15-07  . Hypertension   . Osteoporosis   . Allergy     Past Surgical History  Procedure Laterality Date  . Partial hysterectomy  age 74  . Appendectomy    . Vaginal hysterectomy  age 59    partial  . Cesarean section      x 2    History   Social History  . Marital Status: Widowed    Spouse Name: N/A    Number of Children: 2  . Years of Education: N/A   Occupational History  . Retired      Social History Main Topics  . Smoking status: Former Games developer  . Smokeless tobacco: Not on file  . Alcohol Use: No  . Drug Use: No  . Sexually Active: Not on file   Other Topics Concern  . Not on file   Social History Narrative  . No narrative on file    Family History  Problem Relation Age of Onset  . Coronary artery disease Mother   . Other Mother     bypass surgery  . Osteoarthritis Father   . Hypertension Brother   . Heart attack Maternal Grandfather   . Lung disease Brother     Allergies  Allergen Reactions  . Codeine Phosphate     REACTION: Nausea/vomiting    Medication list has been reviewed and updated.  Outpatient Prescriptions Prior to Visit  Medication Sig Dispense Refill  . ALPRAZolam (XANAX) 0.25 MG tablet Take 1 tablet (0.25 mg total) by mouth at bedtime as needed.  30 tablet  0  . aspirin (ADULT ASPIRIN EC LOW STRENGTH) 81 MG EC tablet Take two tablets by mouth every morning      . atorvastatin (LIPITOR) 20 MG tablet Take 1 tablet (20 mg total) by mouth daily.  30 tablet  11  . benazepril (LOTENSIN) 10 MG tablet Take 1 tablet (10 mg total) by mouth daily.  30 tablet  11  . Calcium Citrate-Vitamin D (CITRACAL/VITAMIN D) 250-200 MG-UNIT TABS Take by mouth.        . levothyroxine (SYNTHROID, LEVOTHROID) 100 MCG tablet Take 1 tablet (100 mcg total) by mouth daily.  30 tablet  10  . multivitamin (THERAGRAN) per tablet Take 1 tablet by mouth daily.        . Omega-3 Fatty Acids (FISH OIL) 1000 MG CAPS Take by mouth. Take two daily       . valACYclovir (VALTREX) 1000 MG tablet Take two tablets by mouth every 12 hours  30 tablet  1   No facility-administered medications prior to visit.    Review of Systems:   GEN: No fevers, chills. Nontoxic. Primarily MSK c/o today. MSK: Detailed in the HPI GI: tolerating PO intake without difficulty Neuro: No numbness, parasthesias, or tingling associated. Otherwise the pertinent positives of the ROS are noted above.     Physical Examination: BP 140/84  Pulse 77  Temp(Src) 98.1 F (36.7 C) (Oral)  Ht 5\' 3"  (1.6 m)  Wt 160 lb (72.576 kg)  BMI 28.35 kg/m2  Ideal Body Weight: Weight in (lb) to have BMI = 25: 140.8   GEN: WDWN, NAD, Non-toxic, A & O x 3 HEENT: Atraumatic, Normocephalic. Neck supple. No masses, No LAD. Ears and Nose: No external deformity. CV: RRR, No M/G/R. No JVD. No thrill. No extra heart sounds. PULM: CTA B, no wheezes, crackles, rhonchi. No retractions. No resp. distress. No accessory muscle use. EXTR: No c/c/e NEURO Normal gait.  PSYCH: Normally interactive. Conversant. Not depressed or anxious appearing.  Calm demeanor.   Bilateral feet: Full range of motion at the ankle. Negative anterior and posterior drawer testing. Nontender at the medial and lateral malleoli. Nontender and minimally tender throughout all shafts of the metatarsals bilaterally. Nontender at the phalanges. On the LEFT side there is some tenderness along the peroneal tendon. Nontender on the posterior tibialis tendons bilaterally. Nontender at the Achilles. Nontender at the plantar fascia insertion. There is some mild tenderness bilaterally with compression of the mid foot without any focal tenderness.  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 She also just moved in to live with her 67 year old mother and is her care provider. This makes her care more challenging. I reviewed very as types of footwear, which I think is the most practical solution here, and we are going to put her in more of a supportive shoe with a more stiff sole.  Orders Today:  No orders of the defined types were placed in this encounter.    Updated Medication List: (Includes new medications, updates to list, dose adjustments) No orders of the defined types were placed in this encounter.    Medications  Discontinued: There are no discontinued medications.    Signed, Elpidio Galea. Roey Coopman, MD 01/05/2013 10:28 AM

## 2013-01-05 NOTE — Addendum Note (Signed)
Addended by: Alvina Chou on: 01/05/2013 11:01 AM   Modules accepted: Orders

## 2013-01-06 ENCOUNTER — Telehealth: Payer: Self-pay | Admitting: Oncology

## 2013-01-06 NOTE — Telephone Encounter (Signed)
S/W PT IN RE NP APPT 4/25 @ 2:30 W/DR. LIVESAY. REFERRING DR. Dayton Martes DX- ANEMIA WELCOME PACKET MAILED.

## 2013-01-07 ENCOUNTER — Telehealth: Payer: Self-pay | Admitting: Oncology

## 2013-01-07 NOTE — Telephone Encounter (Signed)
C/D 01/07/13 foro appt. 01/28/13

## 2013-01-17 ENCOUNTER — Telehealth: Payer: Self-pay

## 2013-01-17 NOTE — Telephone Encounter (Signed)
Nonurgent. i very recently saw this patient.   Hydrocodone-apap 5-325, 1 po q 4 hours prn pain, #30, 0 refills  F/u with me later in the week - i will need a 30 minute appointment

## 2013-01-17 NOTE — Telephone Encounter (Signed)
Pt seen 01/05/13; pt wearing Birkenstock shoes with good support but no relief of bilateral sharp foot pain; pain level now is 8. Feet hurt if sitting or standing; pain is continuous. Tylenol is not helping pain.Pt said she cannot wait until 01/19/13 to see someone; pt request guidance what to do or can med be called in for pain until pt can be seen.Swelling in ankles and feet during the day but swelling does go down at nighttime.[facility]. Pt request call back today.Please advise.

## 2013-01-17 NOTE — Telephone Encounter (Signed)
rx called to pharmacy and appt made

## 2013-01-19 ENCOUNTER — Ambulatory Visit (INDEPENDENT_AMBULATORY_CARE_PROVIDER_SITE_OTHER)
Admission: RE | Admit: 2013-01-19 | Discharge: 2013-01-19 | Disposition: A | Discharge status: Home / Self Care | Payer: Medicare Other | Source: Ambulatory Visit | Attending: Family Medicine | Admitting: Family Medicine

## 2013-01-19 ENCOUNTER — Ambulatory Visit: Payer: Medicare Other | Admitting: Family Medicine

## 2013-01-19 ENCOUNTER — Ambulatory Visit (INDEPENDENT_AMBULATORY_CARE_PROVIDER_SITE_OTHER): Payer: Medicare Other | Admitting: Family Medicine

## 2013-01-19 ENCOUNTER — Encounter: Payer: Self-pay | Admitting: Family Medicine

## 2013-01-19 VITALS — BP 140/80 | HR 57 | Temp 97.9°F | Ht 63.0 in | Wt 162.8 lb

## 2013-01-19 DIAGNOSIS — M79672 Pain in left foot: Secondary | ICD-10-CM

## 2013-01-19 DIAGNOSIS — M79671 Pain in right foot: Secondary | ICD-10-CM

## 2013-01-19 DIAGNOSIS — M7750 Other enthesopathy of unspecified foot: Secondary | ICD-10-CM

## 2013-01-19 DIAGNOSIS — M7672 Peroneal tendinitis, left leg: Secondary | ICD-10-CM

## 2013-01-19 DIAGNOSIS — M79609 Pain in unspecified limb: Secondary | ICD-10-CM

## 2013-01-19 NOTE — Progress Notes (Signed)
[facility] at [facility] [address] [place] [address] Phone: [phone] Fax: [phone]  Date:  01/19/2013   Name:  Carla Lyons   DOB:  May 11, 1941   MRN:  [id number] Gender: female Age: 72 y.o.  Primary Physician:  Ruthe Mannan, MD  Evaluating MD: Hannah Beat, MD   Chief Complaint: Foot Pain   History of Present Illness:  Carla Lyons is a 72 y.o. pleasant patient who presents with the following:  F/u foot pain:  Feet a little better compared that Monday. Seen approx 10 days ago, then called on Monday in acute pain, so much so that I had to call in some Vicodin for her.   Today, she is feeling better compared to Monday, but later in exam states that her right ankle is hurting and throbbing continuously. Left lateral at peroneal insertion is most tender on the L as well as the midfoot. R pain is mostly at the true ankle joint.  Patient Active Problem List  Diagnosis  . HYPOTHYROIDISM  . HYPERLIPIDEMIA, MIXED  . ANEMIA NOS  . DEPRESSION  . HYPERTENSION  . BRADYCARDIA  . LARYNGITIS, ACUTE  . CELLULITIS/ABSCESS, SITE NEC  . WRIST PAIN, RIGHT  . BACK PAIN, LUMBAR  . Foot pain, bilateral  . OSTEOPOROSIS  . CHEST DISCOMFORT  . WOUND, OPEN  . POSTMENOPAUSAL STATUS  . ALLERGIC RHINITIS CAUSE UNSPECIFIED  . Tick bite  . Leg cramps  . Hyperkalemia  . ARF (acute renal failure)  . Edema    Past Medical History  Diagnosis Date  . Colon polyps 06-15-07  . Hypertension   . Osteoporosis   . Allergy     Past Surgical History  Procedure Laterality Date  . Partial hysterectomy  age 71  . Appendectomy    . Vaginal hysterectomy  age 89    partial  . Cesarean section      x 2    History   Social History  . Marital Status: Widowed    Spouse Name: N/A    Number of Children: 2  . Years of Education: N/A   Occupational History  . Retired     Social History Main Topics  . Smoking status: Former Games developer  . Smokeless tobacco: Not on file  .  Alcohol Use: No  . Drug Use: No  . Sexually Active: Not on file   Other Topics Concern  . Not on file   Social History Narrative  . No narrative on file    Family History  Problem Relation Age of Onset  . Coronary artery disease Mother   . Other Mother     bypass surgery  . Osteoarthritis Father   . Hypertension Brother   . Heart attack Maternal Grandfather   . Lung disease Brother     Allergies  Allergen Reactions  . Codeine Phosphate     REACTION: Nausea/vomiting    Medication list has been reviewed and updated.  Outpatient Prescriptions Prior to Visit  Medication Sig Dispense Refill  . ALPRAZolam (XANAX) 0.25 MG tablet Take 1 tablet (0.25 mg total) by mouth at bedtime as needed.  30 tablet  0  . aspirin (ADULT ASPIRIN EC LOW STRENGTH) 81 MG EC tablet Take two tablets by mouth every morning      . atorvastatin (LIPITOR) 20 MG tablet Take 1 tablet (20 mg total) by mouth daily.  30 tablet  11  . benazepril (LOTENSIN) 10 MG tablet Take 1 tablet (10 mg total) by  mouth daily.  30 tablet  11  . Calcium Citrate-Vitamin D (CITRACAL/VITAMIN D) 250-200 MG-UNIT TABS Take by mouth.        . levothyroxine (SYNTHROID, LEVOTHROID) 100 MCG tablet Take 1 tablet (100 mcg total) by mouth daily.  30 tablet  10  . multivitamin (THERAGRAN) per tablet Take 1 tablet by mouth daily.        . Omega-3 Fatty Acids (FISH OIL) 1000 MG CAPS Take by mouth. Take two daily       . valACYclovir (VALTREX) 1000 MG tablet Take two tablets by mouth every 12 hours  30 tablet  1   No facility-administered medications prior to visit.    Review of Systems:   GEN: No fevers, chills. Nontoxic. Primarily MSK c/o today. MSK: Detailed in the HPI GI: tolerating PO intake without difficulty Neuro: No numbness, parasthesias, or tingling associated. Otherwise the pertinent positives of the ROS are noted above.    Physical Examination: BP 140/80  Pulse 57  Temp(Src) 97.9 F (36.6 C) (Oral)  Ht 5\' 3"  (1.6 m)   Wt 162 lb 12 oz (73.823 kg)  BMI 28.84 kg/m2  SpO2 99%  Ideal Body Weight: Weight in (lb) to have BMI = 25: 140.8  GEN: WDWN, NAD, Non-toxic, A & O x 3 HEENT: Atraumatic, Normocephalic. Neck supple. No masses, No LAD. Ears and Nose: No external deformity. CV: RRR, No M/G/R. No JVD. No thrill. No extra heart sounds. PULM: CTA B, no wheezes, crackles, rhonchi. No retractions. No resp. distress. No accessory muscle use. EXTR: No c/c/e NEURO Normal gait.   PSYCH: Normally interactive. Conversant. Not depressed or anxious appearing.  Calm demeanor.   Bilateral feet: Full range of motion at the ankle. Negative anterior and posterior drawer testing. Nontender at the medial and lateral malleoli. Nontender and minimally tender throughout all shafts of the metatarsals bilaterally. Nontender at the phalanges.   At true ankle joint bilaterally at talus, tenderness notable with deep compression  On the LEFT side there is some tenderness along the peroneal tendon. Nontender on the posterior tibialis tendons bilaterally. Nontender at the Achilles. Nontender at the plantar fascia insertion. There is some mild tenderness bilaterally with compression of the mid foot without any focal tenderness.  XR, 3 view, foot series, RIGHT Indication: foot pain Findings: no evidence of acute fracture or dislocation. Mild midfoot OA  XR, 3 view, foot series, LEFT Indication: foot pain Findings: no evidence of acute fracture or dislocation. Mild midfoot OA Independent interpretation, await radiological final reading.  Assessment and Plan:  Bilateral foot pain - Plan: DG Foot Complete Left, DG Foot Complete Right  Peroneal tendonitis, left  Foot pain, right  >25 minutes spent in face to face time with patient, >50% spent in counselling or coordination of care: similar complaints to previously. Patient seems upset. ASO to attempt to unload L peroneal tendon. CAM walker to immobilize R ankle. Arch binder on L to  stabilize midfoot. Sports insoles for cushion.  Recheck 1 mo.  Orders Today:  Orders Placed This Encounter  Procedures  . DG Foot Complete Left    Standing Status: Future     Number of Occurrences: 1     Standing Expiration Date: 03/21/2014    Order Specific Question:  Preferred imaging location?    Answer:  [facility]    Order Specific Question:  Reason for exam:    Answer:  bilateral foot pain  . DG Foot Complete Right    Standing Status: Future  Number of Occurrences: 1     Standing Expiration Date: 03/21/2014    Order Specific Question:  Preferred imaging location?    Answer:  [facility]    Order Specific Question:  Reason for exam:    Answer:  bilateral foot pain    Updated Medication List: (Includes new medications, updates to list, dose adjustments) No orders of the defined types were placed in this encounter.    Medications Discontinued: There are no discontinued medications.    Signed, Elpidio Galea. Sherice Ijames, MD 01/19/2013 11:48 AM

## 2013-01-27 ENCOUNTER — Other Ambulatory Visit: Payer: Self-pay | Admitting: *Deleted

## 2013-01-27 MED ORDER — BENAZEPRIL HCL 10 MG PO TABS: 10.0000 mg | ORAL_TABLET | Freq: Every day | ORAL | Status: DC

## 2013-01-27 MED ORDER — ATORVASTATIN CALCIUM 20 MG PO TABS: 20.0000 mg | ORAL_TABLET | Freq: Every day | ORAL | Status: DC

## 2013-01-28 ENCOUNTER — Other Ambulatory Visit (HOSPITAL_BASED_OUTPATIENT_CLINIC_OR_DEPARTMENT_OTHER): Payer: Medicare Other

## 2013-01-28 ENCOUNTER — Encounter: Payer: Self-pay | Admitting: Oncology

## 2013-01-28 ENCOUNTER — Ambulatory Visit: Payer: Medicare Other

## 2013-01-28 ENCOUNTER — Ambulatory Visit (HOSPITAL_BASED_OUTPATIENT_CLINIC_OR_DEPARTMENT_OTHER): Payer: Medicare Other | Admitting: Oncology

## 2013-01-28 ENCOUNTER — Telehealth: Payer: Self-pay | Admitting: Oncology

## 2013-01-28 ENCOUNTER — Other Ambulatory Visit: Payer: Self-pay | Admitting: Oncology

## 2013-01-28 VITALS — BP 156/65 | HR 58 | Temp 98.2°F | Resp 20 | Ht 63.0 in | Wt 163.5 lb

## 2013-01-28 DIAGNOSIS — D649 Anemia, unspecified: Secondary | ICD-10-CM

## 2013-01-28 DIAGNOSIS — M7989 Other specified soft tissue disorders: Secondary | ICD-10-CM

## 2013-01-28 DIAGNOSIS — R209 Unspecified disturbances of skin sensation: Secondary | ICD-10-CM

## 2013-01-28 DIAGNOSIS — I10 Essential (primary) hypertension: Secondary | ICD-10-CM

## 2013-01-28 DIAGNOSIS — N289 Disorder of kidney and ureter, unspecified: Secondary | ICD-10-CM

## 2013-01-28 DIAGNOSIS — M79671 Pain in right foot: Secondary | ICD-10-CM

## 2013-01-28 LAB — CBC & DIFF AND RETIC
BASO%: 0.7 % (ref 0.0–2.0)
Basophils Absolute: 0 10*3/uL (ref 0.0–0.1)
EOS%: 5.1 % (ref 0.0–7.0)
Eosinophils Absolute: 0.3 10*3/uL (ref 0.0–0.5)
HCT: 32.5 % — ABNORMAL LOW (ref 34.8–46.6)
HGB: 10.3 g/dL — ABNORMAL LOW (ref 11.6–15.9)
Immature Retic Fract: 7.2 % (ref 1.60–10.00)
LYMPH%: 21.2 % (ref 14.0–49.7)
MCH: 32.6 pg (ref 25.1–34.0)
MCHC: 31.7 g/dL (ref 31.5–36.0)
MCV: 102.8 fL — ABNORMAL HIGH (ref 79.5–101.0)
MONO#: 0.6 10*3/uL (ref 0.1–0.9)
MONO%: 9.5 % (ref 0.0–14.0)
NEUT#: 3.9 10*3/uL (ref 1.5–6.5)
NEUT%: 63.5 % (ref 38.4–76.8)
Platelets: 212 10*3/uL (ref 145–400)
RBC: 3.16 10*6/uL — ABNORMAL LOW (ref 3.70–5.45)
RDW: 12.8 % (ref 11.2–14.5)
Retic %: 1.86 % (ref 0.70–2.10)
Retic Ct Abs: 58.78 10*3/uL (ref 33.70–90.70)
WBC: 6.1 10*3/uL (ref 3.9–10.3)
lymph#: 1.3 10*3/uL (ref 0.9–3.3)

## 2013-01-28 LAB — COMPREHENSIVE METABOLIC PANEL (CC13)
ALT: 16 U/L (ref 0–55)
AST: 15 U/L (ref 5–34)
Albumin: 3.6 g/dL (ref 3.5–5.0)
Alkaline Phosphatase: 57 U/L (ref 40–150)
BUN: 27.2 mg/dL — ABNORMAL HIGH (ref 7.0–26.0)
CO2: 24 mEq/L (ref 22–29)
Calcium: 9 mg/dL (ref 8.4–10.4)
Chloride: 108 mEq/L — ABNORMAL HIGH (ref 98–107)
Creatinine: 1.5 mg/dL — ABNORMAL HIGH (ref 0.6–1.1)
Glucose: 109 mg/dl — ABNORMAL HIGH (ref 70–99)
Potassium: 4.7 mEq/L (ref 3.5–5.1)
Sodium: 142 mEq/L (ref 136–145)
Total Bilirubin: 0.4 mg/dL (ref 0.20–1.20)
Total Protein: 7.1 g/dL (ref 6.4–8.3)

## 2013-01-28 LAB — MORPHOLOGY: PLT EST: ADEQUATE

## 2013-01-28 NOTE — Patient Instructions (Addendum)
  You are anemic, and this may be from more than one problem.  Ferritin is low, which indicated low iron stores. Your kidney function is a little decreased, which can cause anemia. The size of your red blood cells is increased, which does not fit with the low iron. I will check rest of labs that are still pending today to try to sort this out.  Ferrous gluconate or ferrous fumarate 325 mg once daily on empty stomach with OJ (ferrous sulfate ok if it does not bother your stomach, but no slow release iron)  Hemoccult cards: bring back when you finish them, either before next visit or at next visit  We will set up venous doppler studies on legs just to be sure no blood clots

## 2013-01-28 NOTE — Progress Notes (Signed)
Checked in new pt with no financial concerns. °

## 2013-01-28 NOTE — Progress Notes (Signed)
[facility] NEW PATIENT EVALUATION   Name: Carla Lyons Date: 01/28/2013 MRN: [id number] DOB: Dec 22, 1940  REFERRING PHYSICIAN: Dianne Dun, MD (PCP, [facility]) Other Physicians: Kerin Perna, Melvia Heaps,  [facility])   REASON FOR REFERRAL:  anemia    HISTORY OF PRESENT ILLNESS:Carla Lyons is a 72 y.o. female who is seen in consultation at request of Dr Ruthe Mannan, together with daughter, for anemia. History is from EMR and patient. Patient has known that she had some anemia for past few years, when she was not able to donate blood because of this. From EMR, earliest available CBC was 05-2007 with hemoglobin 10.2, WBC 6.4, plt 192 and MCV 97; serum iron then was 32 with %sat 14. Fecal occult blood was negative in 11-2009, with CBC then WBC 8.6, Hgb 11.4, plt 190 and MCV 100.4. In Aug 2012 HGB 11.6 with WBC and platelets fine and MCV 97; ferritin then was 30. She has been on some oral iron (ferrous sulfate) which she takes with full meal, not sure of dose or duration of treatment. Most recently patient was seen by Drs Dayton Martes and Dallas Schimke for new onset "burning pain" and tingling in feet and lower legs bilaterally, which began several weeks ago, initially also with swelling in feet bilaterally. All symptoms other than pedal edema have persisted unchanged. She tried some more supportive shoes, which seemed to aggravate swelling. Laboratory evaluation 01-03-13 included B12 333, folate 15.7, ferritin 15, TSH 1.37, T4 1.05, CMET with glucose 86, creat ~stable at 1.2, LFTs normal including Tbili 0.7, T prot 7.0 with alb 3.9, calc GFR 45. Has not done hemoccult cards in past year. CBC from 01-03-13 had WBC 7.7, Hgb 10.4, MCV 100.4 and plt 216k. Xrays of feet showed no fracture, and mild degenerative changes and R heel spur.  Note she has donated blood 2-3x yearly for ~ 20 years until last 2-3 years when "not able to donate because anemic by [employer] [facility]". She was never on regular iron  supplement thru all of that donation until recently.    REVIEW OF SYSTEMS: Recent weight gain, with usual ~ 145 lbs. No recent viral or other illness. No HA. Wears reading glasses. No sinus symptoms. No respiratory symptoms including SOB, no chest pain. No N/V/GERD or change in bowels. No noted bleeding. Mammograms last month at [facility] ok and no changes on self exam. Bladder ok. Occasional mild low back pain. No skin rash. No history blood clots. Gurgling in LUQ abdomen. On careful history, she states that feet are much worse when she first stands.  ALLERGIES: Codeine phosphate  PAST MEDICAL HISTORY:  has a past medical history of Colon polyps (06-15-07); Hypertension; Osteoporosis; and Allergy.   Hypothyroid on meds x 10 years HTN x years Bradycardia evaluated by Dr Juanito Doom ~ 6 years ago including cardiac monitor, no findings of concern and does not see him on regular basis now. Colon polyps 06-2007, next colonoscopy due Oct 2014 (q 3 years).  CURRENT MEDICATIONS: reviewed as listed in EMR.   SOCIAL HISTORY:  reports that she has quit smoking. She does not have any smokeless tobacco history on file. She reports that she does not drink alcohol or use illicit drugs.  Originally from [place]. Widowed 7 years ago, husband treated at [facility] for cancer by Dr Roselind Messier. Son in [place], daughter in [place]. Smoked < 1ppd x 15 years, quit 10-07-2001. Occasional wine. Worked in Engineering geologist and day care. Four grandchildren ages 13 to 42.  FAMILY HISTORY: family history includes Coronary artery disease in her mother; Heart attack in her maternal grandfather; Hypertension in her brother; Lung disease in her brother; Osteoarthritis in her father; and Other in her mother. Brother with cirrhosis 1 brother and 1 sister died with pulmonary fibrosis 1 sister just diagnosed with pulmonary fibrosis  Children healthy  LABORATORY DATA:  Results for orders placed in visit on 01/28/13 (from the past 48 hour(s))   MORPHOLOGY     Status: None   Collection Time    01/28/13  2:28 PM      Result Value Range   Polychromasia Slight  Slight   White Cell Comments C/W auto diff     PLT EST Adequate  Adequate  COMPREHENSIVE METABOLIC PANEL (CC13)     Status: Abnormal   Collection Time    01/28/13  2:28 PM      Result Value Range   Sodium 142  136 - 145 mEq/L   Potassium 4.7  3.5 - 5.1 mEq/L   Chloride 108 (*) 98 - 107 mEq/L   CO2 24  22 - 29 mEq/L   Glucose 109 (*) 70 - 99 mg/dl   BUN 16.1 (*) 7.0 - 09.6 mg/dL   Creatinine 1.5 (*) 0.6 - 1.1 mg/dL   Total Bilirubin 0.45  0.20 - 1.20 mg/dL   Alkaline Phosphatase 57  40 - 150 U/L   AST 15  5 - 34 U/L   ALT 16  0 - 55 U/L   Total Protein 7.1  6.4 - 8.3 g/dL   Albumin 3.6  3.5 - 5.0 g/dL   Calcium 9.0  8.4 - 40.9 mg/dL     Pending labs: erythropoietin level, morphology, retic count, SPEP Hemoccult cards given.  RADIOGRAPHY: No results found.       PHYSICAL EXAM:  height is 5\' 3"  (1.6 m) and weight is 163 lb 8 oz (74.163 kg). Her oral temperature is 98.2 F (36.8 C). Her blood pressure is 156/65 and her pulse is 58. Her respiration is 20.  Pleasant WF looks stated age, talkative, NAD, ambulatory without difficulty, does not look obviously ill. HEENT: normal hair pattern, PERRL, not icteric, EOMI, oral mucosa moist without lesions. Neck supple without JVD Lymphatics: no cervical, supraclavicular, axillary or inguinal adenopathy Breasts bilaterally without dominant mass, skin or nipple findings Heart RRR no murmur, no gallop Lungs clear to A&P. Spine not tender to palpation Skin not icteric, not markedly pale, no rash, no ecchymoses Abdomen soft, nontender, normal BS, not distended, no appreciable HSM or mass LE trace pedal edema bilaterally, no cords. Small varicose veins feet and ankles. No effusion or tenderness knees. Appears to have little arch.   We have discussed all of information above. Mild anemia documented back at least to 2008,  without other blood count abnormalities. This may well be multifactorial, with iron deficiency likely cumulative from multiple blood donations over years +/- GI blood loss from colon polyps or other, and also some decreased renal function. The etiology of elevated MCV is not clear, tho B12 level was lower most recently than previous determinations, and certainly elevated MCV sometimes can suggest a myeloproliferative disorder. We have discussed optimal administration of oral iron on empty stomach with OJ. She is given hemoccult cards to return here at next visit in ~ 2 weeks. I have made referral to podiatry as I wonder about plantar fasciitis as part of the foot complaints. With history of pedal edema, will check bilateral LE venous dopplers next  available.   IMPRESSION / PLAN:  1.Anemia: likely multifactorial. Evaluation in process, see above 2.Burning and numbness LE and feet bilaterally: not diabetic. ?B12 related or other. Podiatry consult. ? If needs neurology to see. 3.hypothyroidism: on replacement with labs good 4.hx colon polyps 5.some renal insufficiency, not new 6.HTN, elevated lipids. 7.past tobacco 8. Family history pulmonary fibrosis in 3 siblings  Patient and daughter had questions answered to their satisfaction and were in agreement with plan above. TIme spent 50 min, >50% in discussion.  Reece Packer, MD 01/28/2013 8:17 PM

## 2013-01-28 NOTE — Telephone Encounter (Signed)
gv and printed pt appt and avs for April and May.Marland KitchenMarland KitchenSchedule pt with Chrystal to see Dr. Al Corpus on 5.1. @ 9am...pt aware of the doppler on 4.29.14

## 2013-02-01 ENCOUNTER — Other Ambulatory Visit (HOSPITAL_BASED_OUTPATIENT_CLINIC_OR_DEPARTMENT_OTHER): Payer: Medicare Other | Admitting: Oncology

## 2013-02-01 ENCOUNTER — Other Ambulatory Visit: Payer: Self-pay | Admitting: *Deleted

## 2013-02-01 ENCOUNTER — Encounter (HOSPITAL_COMMUNITY): Payer: Medicare Other

## 2013-02-01 DIAGNOSIS — D649 Anemia, unspecified: Secondary | ICD-10-CM

## 2013-02-01 LAB — FECAL OCCULT BLOOD, GUAIAC: Occult Blood: NEGATIVE

## 2013-02-02 ENCOUNTER — Telehealth: Payer: Self-pay | Admitting: *Deleted

## 2013-02-02 NOTE — Telephone Encounter (Signed)
Pt notified of no blood in stools

## 2013-02-03 LAB — PROTEIN ELECTROPHORESIS, SERUM
Albumin ELP: 60 % (ref 55.8–66.1)
Alpha-1-Globulin: 4.7 % (ref 2.9–4.9)
Alpha-2-Globulin: 12.3 % — ABNORMAL HIGH (ref 7.1–11.8)
Beta 2: 5 % (ref 3.2–6.5)
Beta Globulin: 7.6 % — ABNORMAL HIGH (ref 4.7–7.2)
Gamma Globulin: 10.4 % — ABNORMAL LOW (ref 11.1–18.8)
Total Protein, Serum Electrophoresis: 5.7 g/dL — ABNORMAL LOW (ref 6.0–8.3)

## 2013-02-03 LAB — ERYTHROPOIETIN: Erythropoietin: 16.6 m[IU]/mL (ref 2.6–18.5)

## 2013-02-07 ENCOUNTER — Ambulatory Visit (HOSPITAL_COMMUNITY)
Admission: RE | Admit: 2013-02-07 | Discharge: 2013-02-07 | Disposition: A | Discharge status: Home / Self Care | Payer: Medicare Other | Source: Ambulatory Visit | Attending: Oncology | Admitting: Oncology

## 2013-02-07 DIAGNOSIS — M7989 Other specified soft tissue disorders: Secondary | ICD-10-CM

## 2013-02-07 DIAGNOSIS — R609 Edema, unspecified: Secondary | ICD-10-CM | POA: Insufficient documentation

## 2013-02-07 NOTE — Progress Notes (Signed)
*  PRELIMINARY RESULTS* Vascular Ultrasound Lower extremity venous duplex has been completed.  Preliminary findings: Bilaterally no evidence of DVT or baker's cyst.  Attempted call report to Dr. Precious Reel RN at [phone]. Left message with results.   Farrel Demark, RDMS, RVT  02/07/2013, 10:14 AM

## 2013-02-15 ENCOUNTER — Ambulatory Visit (HOSPITAL_BASED_OUTPATIENT_CLINIC_OR_DEPARTMENT_OTHER): Payer: Medicare Other | Admitting: Oncology

## 2013-02-15 ENCOUNTER — Encounter: Payer: Self-pay | Admitting: Oncology

## 2013-02-15 ENCOUNTER — Other Ambulatory Visit (HOSPITAL_BASED_OUTPATIENT_CLINIC_OR_DEPARTMENT_OTHER): Payer: Medicare Other | Admitting: Lab

## 2013-02-15 ENCOUNTER — Telehealth: Payer: Self-pay | Admitting: Oncology

## 2013-02-15 VITALS — BP 157/82 | HR 51 | Temp 97.5°F | Resp 18 | Ht 63.0 in | Wt 160.1 lb

## 2013-02-15 DIAGNOSIS — I10 Essential (primary) hypertension: Secondary | ICD-10-CM

## 2013-02-15 DIAGNOSIS — I498 Other specified cardiac arrhythmias: Secondary | ICD-10-CM

## 2013-02-15 DIAGNOSIS — D649 Anemia, unspecified: Secondary | ICD-10-CM

## 2013-02-15 LAB — URINALYSIS, MICROSCOPIC - CHCC
Bilirubin (Urine): NEGATIVE
Blood: NEGATIVE
Glucose: NEGATIVE mg/dL
Ketones: 5 mg/dL
Leukocyte Esterase: NEGATIVE
Nitrite: NEGATIVE
Protein: <30 mg/dL
Specific Gravity, Urine: 1.03 (ref 1.003–1.035)
Urobilinogen, UR: 0.2 mg/dL (ref 0.2–1)
pH: 6 (ref 4.6–8.0)

## 2013-02-15 LAB — CBC WITH DIFFERENTIAL/PLATELET
BASO%: 0.7 % (ref 0.0–2.0)
Basophils Absolute: 0.1 10*3/uL (ref 0.0–0.1)
EOS%: 2.4 % (ref 0.0–7.0)
Eosinophils Absolute: 0.2 10*3/uL (ref 0.0–0.5)
HCT: 32 % — ABNORMAL LOW (ref 34.8–46.6)
HGB: 10.8 g/dL — ABNORMAL LOW (ref 11.6–15.9)
LYMPH%: 17.1 % (ref 14.0–49.7)
MCH: 33.8 pg (ref 25.1–34.0)
MCHC: 33.9 g/dL (ref 31.5–36.0)
MCV: 99.9 fL (ref 79.5–101.0)
MONO#: 1 10*3/uL — ABNORMAL HIGH (ref 0.1–0.9)
MONO%: 10.4 % (ref 0.0–14.0)
NEUT#: 6.7 10*3/uL — ABNORMAL HIGH (ref 1.5–6.5)
NEUT%: 69.4 % (ref 38.4–76.8)
Platelets: 182 10*3/uL (ref 145–400)
RBC: 3.21 10*6/uL — ABNORMAL LOW (ref 3.70–5.45)
RDW: 12.7 % (ref 11.2–14.5)
WBC: 9.7 10*3/uL (ref 3.9–10.3)
lymph#: 1.7 10*3/uL (ref 0.9–3.3)

## 2013-02-15 NOTE — Patient Instructions (Signed)
Continue iron on empty stomach with OJ daily

## 2013-02-15 NOTE — Telephone Encounter (Signed)
Gave pt appt for lab and MD for August 2014

## 2013-02-15 NOTE — Telephone Encounter (Signed)
Gave pt appt for lab and MD for November 2014 °

## 2013-02-15 NOTE — Progress Notes (Signed)
OFFICE PROGRESS NOTE   02/15/2013   Physicians:Aron, Bryn Gulling, MD (PCP, [facility]),Spencer Grundy, Melvia Heaps, (Tom [place]), [place] (podiatry)   INTERVAL HISTORY:   Patient is seen, together with daughter, in follow up of anemia. Evaluation with initial consultation visit in April included Hgb 10.3 with MCV 102.8 and other counts ok, creatinine 1.3, ferritin 15, folate 15.7, B12 333, SPEP with no M spike, urinalysis negative blood, hemoccults negative x3 and erythropoietin level of 16. She also had negative venous dopplers due to swelling in LE. She has history of donating blood 2-3x yearly for 20 years. She has been on ferrous fumarate on empty stomach with OJ daily since I saw her in late April. Patient also has seen podiatry, with steroid injections into feet bilaterally + prednisone x 6 days + meloxicam briefly (DCd when BP elevated), which have helped the pain significantly; she also was found to have hairline fracture left foot laterally  She is tolerating the oral iron with no difficulty. She feels much better since foot discomfort has improved, with excellent energy. Swelling in LE and feet is better. No bleeding. No fever or symptoms of infection. No other new pain. Bowels ok. No sweats. No respiratory symptoms. Remainder of 10 point Review of Systems negative.  Objective:  Vital signs in last 24 hours:  BP 157/82  Pulse 51  Temp(Src) 97.5 F (36.4 C) (Oral)  Resp 18  Ht 5\' 3"  (1.6 m)  Wt 160 lb 1.6 oz (72.621 kg)  BMI 28.37 kg/m2  Weight is down 3 lbs. Much more easily mobile, looks comfortable. Respirations not labored RA.  HEENT:PERRLA, sclera clear, anicteric and oropharynx clear, no lesions LymphaticsCervical, supraclavicular, and axillary nodes normal. Resp: clear to auscultation bilaterally and normal percussion bilaterally Cardio: regular rate and rhythm GI: soft, non-tender; bowel sounds normal; no masses,  no organomegaly Extremities: trace pedal  edema bilaterally, no cords or tenderness in LE Neuro:no focal deficits Skin with a few ~ 1 cm ecchymoses UE scattered.   Lab Results:  Results for orders placed in visit on 02/15/13  CBC WITH DIFFERENTIAL      Result Value Range   WBC 9.7  3.9 - 10.3 10e3/uL   NEUT# 6.7 (*) 1.5 - 6.5 10e3/uL   HGB 10.8 (*) 11.6 - 15.9 g/dL   HCT 16.1 (*) 09.6 - 04.5 %   Platelets 182  145 - 400 10e3/uL   MCV 99.9  79.5 - 101.0 fL   MCH 33.8  25.1 - 34.0 pg   MCHC 33.9  31.5 - 36.0 g/dL   RBC 4.09 (*) 8.11 - 9.14 10e6/uL   RDW 12.7  11.2 - 14.5 %   lymph# 1.7  0.9 - 3.3 10e3/uL   MONO# 1.0 (*) 0.1 - 0.9 10e3/uL   Eosinophils Absolute 0.2  0.0 - 0.5 10e3/uL   Basophils Absolute 0.1  0.0 - 0.1 10e3/uL   NEUT% 69.4  38.4 - 76.8 %   LYMPH% 17.1  14.0 - 49.7 %   MONO% 10.4  0.0 - 14.0 %   EOS% 2.4  0.0 - 7.0 %   BASO% 0.7  0.0 - 2.0 %  URINALYSIS, MICROSCOPIC - [facility]      Result Value Range   Glucose Negative  Negative mg/dL   Bilirubin (Urine) Negative  Negative   Ketones 5  Negative mg/dL   Specific Gravity, Urine 1.030  1.003 - 1.035   Blood Negative  Negative   pH 6.0  4.6 - 8.0  Protein < 30  Negative- <30 mg/dL   Urobilinogen, UR 0.2  0.2 - 1 mg/dL   Nitrite Negative  Negative   Leukocyte Esterase Negative  Negative   RBC / HPF 0-2  0 - 2   WBC, UA 0-2  0 - 2   Bacteria, UA Trace  Negative- Trace   Casts Hyaline  Negative   Epithelial Cells Few  Negative- Few    Hemoglobin is up from 10.3 on 01-28-13  Studies/Results:  No results found.  Medications: I have reviewed the patient's current medications. She will continue oral iron as above.  While it is not clear that iron deficiency is all of the problem with anemia, especially with macrocytic indices, counts are a little better and nothing else is of more concern now. I have suggested that we recheck counts and B12 in 2-3 months, or certainly sooner if other concerns. If counts do not correct, would consider bone marrow  exam.  Assessment/Plan:  1.anemia which may be multifactorial, with low ferritin and history of 40-60 units PRBC donations over 20 years, also macrocytic with dropping B12 tho still in normal range by last labs. Continue oral iron for now, follow up as above 2.foot pain improved with interventions by podiatrist 3.hypothyroid on replacement 4.hx colon polyps: HC negative, for colonoscopy Oct 2014 5.HTN and bradycardia 6.family hx pulmonary fibrosis in 3 siblings  Patient and daughter are in agreement with plan above  Carla Lyons P, MD   02/15/2013, 1:08 PM

## 2013-02-17 ENCOUNTER — Ambulatory Visit: Payer: Medicare Other | Admitting: Family Medicine

## 2013-02-25 LAB — VITAMIN B12 DEFICIENCY PANEL - CHCC

## 2013-03-22 ENCOUNTER — Other Ambulatory Visit: Payer: Self-pay | Admitting: Family Medicine

## 2013-03-23 ENCOUNTER — Encounter: Payer: Self-pay | Admitting: *Deleted

## 2013-03-24 ENCOUNTER — Other Ambulatory Visit: Payer: Self-pay | Admitting: Family Medicine

## 2013-03-24 ENCOUNTER — Encounter: Payer: Self-pay | Admitting: Family Medicine

## 2013-03-24 ENCOUNTER — Ambulatory Visit (INDEPENDENT_AMBULATORY_CARE_PROVIDER_SITE_OTHER): Payer: Medicare Other | Admitting: Family Medicine

## 2013-03-24 VITALS — BP 142/90 | HR 68 | Temp 98.0°F | Wt 161.0 lb

## 2013-03-24 DIAGNOSIS — E782 Mixed hyperlipidemia: Secondary | ICD-10-CM

## 2013-03-24 DIAGNOSIS — R7989 Other specified abnormal findings of blood chemistry: Secondary | ICD-10-CM

## 2013-03-24 DIAGNOSIS — I10 Essential (primary) hypertension: Secondary | ICD-10-CM

## 2013-03-24 DIAGNOSIS — E039 Hypothyroidism, unspecified: Secondary | ICD-10-CM

## 2013-03-24 DIAGNOSIS — D649 Anemia, unspecified: Secondary | ICD-10-CM

## 2013-03-24 LAB — COMPREHENSIVE METABOLIC PANEL
ALT: 22 U/L (ref 0–35)
AST: 24 U/L (ref 0–37)
Albumin: 4.2 g/dL (ref 3.5–5.2)
Alkaline Phosphatase: 49 U/L (ref 39–117)
BUN: 32 mg/dL — ABNORMAL HIGH (ref 6–23)
CO2: 25 mEq/L (ref 19–32)
Calcium: 9.5 mg/dL (ref 8.4–10.5)
Chloride: 108 mEq/L (ref 96–112)
Creatinine, Ser: 1.5 mg/dL — ABNORMAL HIGH (ref 0.4–1.2)
GFR: 35.83 mL/min — ABNORMAL LOW (ref >60.00)
Glucose, Bld: 109 mg/dL — ABNORMAL HIGH (ref 70–99)
Potassium: 5.3 mEq/L — ABNORMAL HIGH (ref 3.5–5.1)
Sodium: 140 mEq/L (ref 135–145)
Total Bilirubin: 0.7 mg/dL (ref 0.3–1.2)
Total Protein: 7.6 g/dL (ref 6.0–8.3)

## 2013-03-24 LAB — TSH: TSH: 1 u[IU]/mL (ref 0.35–5.50)

## 2013-03-24 LAB — LIPID PANEL
Cholesterol: 188 mg/dL (ref 0–200)
HDL: 60.7 mg/dL (ref >39.00)
LDL Cholesterol: 97 mg/dL (ref 0–99)
Total CHOL/HDL Ratio: 3
Triglycerides: 151 mg/dL — ABNORMAL HIGH (ref 0.0–149.0)
VLDL: 30.2 mg/dL (ref 0.0–40.0)

## 2013-03-24 LAB — T4, FREE: Free T4: 1.2 ng/dL (ref 0.60–1.60)

## 2013-03-24 MED ORDER — ATORVASTATIN CALCIUM 20 MG PO TABS: 20.0000 mg | ORAL_TABLET | Freq: Every day | ORAL | Status: DC

## 2013-03-24 MED ORDER — BENAZEPRIL HCL 10 MG PO TABS: 10.0000 mg | ORAL_TABLET | Freq: Every day | ORAL | Status: DC

## 2013-03-24 MED ORDER — LEVOTHYROXINE SODIUM 100 MCG PO TABS
100.0000 ug | ORAL_TABLET | Freq: Every day | ORAL | Status: DC
Start: 2013-03-24 — End: 2013-11-07

## 2013-03-24 NOTE — Progress Notes (Signed)
72 yo pleasant female  here for med refills.  Doing well.  She is living with her mother now that has dementia.  Feels she is handling this ok.  Cholesterol- stable on Lipitor 20 mg daily.   Denies any side effects. Due for labs.  Lab Results  Component Value Date   CHOL 167 05/28/2011   HDL 58 05/28/2011   LDLCALC 83 05/28/2011   TRIG 130 05/28/2011   CHOLHDL 2.9 05/28/2011    Hypothyroidism- denies any symptoms of hypo or hyper thyroidism.   Lab Results  Component Value Date   TSH 1.37 01/03/2013   HTN- stable on Benazapril 10 mg daily. Denies any CP, SOB or blurred vision.  Patient Active Problem List   Diagnosis Date Noted  . ALLERGIC RHINITIS CAUSE UNSPECIFIED 11/13/2010  . POSTMENOPAUSAL STATUS 11/03/2008  . HYPERTENSION 09/15/2007  . OSTEOPOROSIS 09/15/2007  . ANEMIA NOS 05/26/2007  . HYPOTHYROIDISM 05/17/2007  . DEPRESSION 05/17/2007  . 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 Colon polyps 06-15-07  . Hypertension   . Osteoporosis   . Allergy    Past Surgical History  Procedure Laterality Date  . Partial hysterectomy  age 71  . Appendectomy    . Vaginal hysterectomy  age 27    partial  . Cesarean section      x 2   History  Substance Use Topics  . Smoking status: Former Smoker    Quit date: 10/07/2001  . Smokeless tobacco: Not on file  . Alcohol Use: No   Family History  Problem Relation Age of Onset  . Coronary artery disease Mother   . Other Mother     bypass surgery  . Osteoarthritis Father   . Hypertension Brother   . Heart attack Maternal Grandfather   . Lung disease Brother    Allergies  Allergen Reactions  . Codeine Phosphate     REACTION: Nausea/vomiting   Current Outpatient Prescriptions on File Prior to Visit  Medication Sig Dispense Refill  . ALPRAZolam (XANAX) 0.25 MG tablet Take 1 tablet (0.25 mg total) by mouth at bedtime as needed.  30 tablet  0  . aspirin (ADULT ASPIRIN EC LOW STRENGTH) 81 MG  EC tablet Take two tablets by mouth every morning      . atorvastatin (LIPITOR) 20 MG tablet Take 1 tablet (20 mg total) by mouth daily. *Needs to schedule a physical with Dr Dayton Martes for additional refills*  30 tablet  3  . benazepril (LOTENSIN) 10 MG tablet Take 1 tablet (10 mg total) by mouth daily. *Needs to schedule a physical with Dr Dayton Martes for additional refills*  30 tablet  3  . Calcium Citrate-Vitamin D (CITRACAL/VITAMIN D) 250-200 MG-UNIT TABS Take by mouth.        . ferrous fumarate (HEMOCYTE - 106 MG FE) 325 (106 FE) MG TABS Take 1 tablet by mouth at bedtime. Taking ferrous fumarate or gluconate      . HYDROcodone-acetaminophen (NORCO/VICODIN) 5-325 MG per tablet       . levothyroxine (SYNTHROID, LEVOTHROID) 100 MCG tablet Take 1 tablet (100 mcg total) by mouth daily.  30 tablet  10  . multivitamin (THERAGRAN) per tablet Take 1 tablet by mouth daily.        . Omega-3 Fatty Acids (FISH OIL) 1000 MG CAPS Take by mouth. Take two daily       . valACYclovir (VALTREX) 1000 MG tablet TAKE ONE (1) TABLET BY MOUTH EVERY DAY  30  tablet  0   No current facility-administered medications on file prior to visit.   The PMH, PSH, Social History, Family History, Medications, and allergies have been reviewed in [facility], and have been updated if relevant.  ROS: See HPI Patient reports no  vision/ hearing changes,anorexia, weight change, fever ,adenopathy, persistant / recurrent hoarseness, swallowing issues, chest pain, edema,persistant / recurrent cough, hemoptysis, dyspnea(rest, exertional, paroxysmal nocturnal), gastrointestinal  bleeding (melena, rectal bleeding), abdominal pain, excessive heart burn, GU symptoms(dysuria, hematuria, pyuria, voiding/incontinence  Issues) syncope, focal weakness, severe memory loss, concerning skin lesions, depression, anxiety, abnormal bruising/bleeding, major joint swelling, breast masses or abnormal vaginal bleeding.    Physical exam: BP 142/90  Pulse 68  Temp(Src) 98 F  (36.7 C)  Wt 161 lb (73.029 kg)  BMI 28.53 kg/m2  Wt Readings from Last 3 Encounters:  03/24/13 161 lb (73.029 kg)  02/15/13 160 lb 1.6 oz (72.621 kg)  01/28/13 163 lb 8 oz (74.163 kg)    General:  Well-developed,well-nourished,in no acute distress; alert,appropriate and cooperative throughout examination Head:  normocephalic and atraumatic.   Eyes:  vision grossly intact, pupils equal, pupils round, and pupils reactive to light.   Ears:  R ear normal and L ear normal.   Nose:  no external deformity.   Mouth:  good dentition.   Lungs:  Normal respiratory effort, chest expands symmetrically. Lungs are clear to auscultation, no crackles or wheezes. Heart:  Normal rate and regular rhythm. S1 and S2 normal without gallop, murmur, click, rub or other extra sounds. Abdomen:  Bowel sounds positive,abdomen soft and non-tender without masses, organomegaly or hernias noted. Msk:  No deformity or scoliosis noted of thoracic or lumbar spine.   Extremities:  No clubbing, cyanosis, edema, or deformity noted with normal full range of motion of all joints.   Neurologic:  alert & oriented X3 and gait normal.   Skin:  Intact without suspicious lesions or rashes Psych:  Cognition and judgment appear intact. Alert and cooperative with normal attention span and concentration. No apparent delusions, illusions, hallucinations  Assessment and Plan:  1. HYPERLIPIDEMIA, MIXED Stable. Continue current meds. Recheck labs today. - Lipid Panel  2. HYPERTENSION Well controlled. - Comprehensive metabolic panel  3. HYPOTHYROIDISM Recheck labs today.   - TSH - T4, Free  4. ANEMIA NOS Followed by hematology.  Next appt in August.

## 2013-03-24 NOTE — Patient Instructions (Addendum)
Good to see you. We will call you with your lab results.   

## 2013-03-24 NOTE — Addendum Note (Signed)
Addended by: Eliezer Bottom on: 03/24/2013 09:18 AM   Modules accepted: Orders

## 2013-03-30 ENCOUNTER — Encounter: Payer: Self-pay | Admitting: Radiology

## 2013-03-31 ENCOUNTER — Telehealth: Payer: Self-pay | Admitting: Family Medicine

## 2013-03-31 ENCOUNTER — Other Ambulatory Visit (INDEPENDENT_AMBULATORY_CARE_PROVIDER_SITE_OTHER): Payer: Medicare Other

## 2013-03-31 DIAGNOSIS — E875 Hyperkalemia: Secondary | ICD-10-CM

## 2013-03-31 DIAGNOSIS — N289 Disorder of kidney and ureter, unspecified: Secondary | ICD-10-CM

## 2013-03-31 LAB — BASIC METABOLIC PANEL
BUN: 26 mg/dL — ABNORMAL HIGH (ref 6–23)
CO2: 26 mEq/L (ref 19–32)
Calcium: 9.6 mg/dL (ref 8.4–10.5)
Chloride: 107 mEq/L (ref 96–112)
Creatinine, Ser: 1.3 mg/dL — ABNORMAL HIGH (ref 0.4–1.2)
GFR: 44.49 mL/min — ABNORMAL LOW (ref >60.00)
Glucose, Bld: 87 mg/dL (ref 70–99)
Potassium: 5.3 mEq/L — ABNORMAL HIGH (ref 3.5–5.1)
Sodium: 140 mEq/L (ref 135–145)

## 2013-03-31 NOTE — Telephone Encounter (Signed)
Patient walked into the office today for lab work and stopped by my office to tell me that she didn't want the appointment that I set up for her with Dr Thedore Mins on 04/15/13, [facility]. I have spoken to the Referral person at [facility] in [place] and they told me that they have 175 patients that they still have to set up right now. Please put New Nephrology referral in for this patient as the previous one has been Authorized and we will need a New on to use when the appointment is made. Patient is aware that it can take as long as 6 months to get scheduled from this office and that they will be scheduling her from the recent labs that we send with the referral, it will not have her kidney function 6 months from now that could be worse than what they schedule off of.

## 2013-03-31 NOTE — Telephone Encounter (Signed)
Referral done

## 2013-04-01 NOTE — Telephone Encounter (Signed)
Thank you :)

## 2013-04-05 ENCOUNTER — Telehealth: Payer: Self-pay

## 2013-04-05 ENCOUNTER — Other Ambulatory Visit: Payer: Self-pay | Admitting: Family Medicine

## 2013-04-05 NOTE — Telephone Encounter (Signed)
Yes please check TSH and FT4 in 8 weeks.

## 2013-04-05 NOTE — Telephone Encounter (Signed)
Amy with [facility] left v/m to notify Dr Claudine Mouton had to change manufacturers for  generic synthroid incase need to see pt sooner or check thyroid levels sooner. Amy does not require cb.

## 2013-04-05 NOTE — Telephone Encounter (Signed)
I have tried to reach patient by phone several times today, number is always busy.

## 2013-04-05 NOTE — Telephone Encounter (Signed)
Refill called to [facility]. 

## 2013-04-06 NOTE — Telephone Encounter (Signed)
Pt left v/m requesting cb about thyroid and BP med.

## 2013-04-06 NOTE — Telephone Encounter (Signed)
Advised patient regarding change in thyroid medicine.  Lab appt scheduled for 9/4.

## 2013-04-11 ENCOUNTER — Telehealth: Payer: Self-pay | Admitting: *Deleted

## 2013-04-11 NOTE — Telephone Encounter (Signed)
Called patient regarding note received from [facility].  They suggest pt d/c mobic.  Advised patient, she states she has already stopped taking this.

## 2013-04-13 ENCOUNTER — Encounter: Payer: Self-pay | Admitting: *Deleted

## 2013-04-14 ENCOUNTER — Ambulatory Visit: Payer: Medicare Other | Admitting: Family Medicine

## 2013-04-17 ENCOUNTER — Emergency Department (HOSPITAL_COMMUNITY): Payer: Medicare Other

## 2013-04-17 ENCOUNTER — Encounter (HOSPITAL_COMMUNITY): Payer: Self-pay | Admitting: *Deleted

## 2013-04-17 ENCOUNTER — Emergency Department (HOSPITAL_COMMUNITY)
Admission: EM | Admit: 2013-04-17 | Discharge: 2013-04-17 | Disposition: A | Discharge status: Home / Self Care | Payer: Medicare Other | Attending: Emergency Medicine | Admitting: Emergency Medicine

## 2013-04-17 DIAGNOSIS — M810 Age-related osteoporosis without current pathological fracture: Secondary | ICD-10-CM | POA: Insufficient documentation

## 2013-04-17 DIAGNOSIS — Z7982 Long term (current) use of aspirin: Secondary | ICD-10-CM | POA: Insufficient documentation

## 2013-04-17 DIAGNOSIS — R5381 Other malaise: Secondary | ICD-10-CM | POA: Insufficient documentation

## 2013-04-17 DIAGNOSIS — M545 Low back pain, unspecified: Secondary | ICD-10-CM | POA: Insufficient documentation

## 2013-04-17 DIAGNOSIS — E875 Hyperkalemia: Secondary | ICD-10-CM | POA: Insufficient documentation

## 2013-04-17 DIAGNOSIS — R002 Palpitations: Secondary | ICD-10-CM | POA: Insufficient documentation

## 2013-04-17 DIAGNOSIS — I10 Essential (primary) hypertension: Secondary | ICD-10-CM | POA: Insufficient documentation

## 2013-04-17 DIAGNOSIS — R109 Unspecified abdominal pain: Secondary | ICD-10-CM

## 2013-04-17 DIAGNOSIS — R110 Nausea: Secondary | ICD-10-CM | POA: Insufficient documentation

## 2013-04-17 DIAGNOSIS — R252 Cramp and spasm: Secondary | ICD-10-CM | POA: Insufficient documentation

## 2013-04-17 DIAGNOSIS — Z79899 Other long term (current) drug therapy: Secondary | ICD-10-CM | POA: Insufficient documentation

## 2013-04-17 DIAGNOSIS — E079 Disorder of thyroid, unspecified: Secondary | ICD-10-CM | POA: Insufficient documentation

## 2013-04-17 DIAGNOSIS — Z860100 Personal history of colon polyps, unspecified: Secondary | ICD-10-CM | POA: Insufficient documentation

## 2013-04-17 DIAGNOSIS — Z87891 Personal history of nicotine dependence: Secondary | ICD-10-CM | POA: Insufficient documentation

## 2013-04-17 DIAGNOSIS — IMO0001 Reserved for inherently not codable concepts without codable children: Secondary | ICD-10-CM | POA: Insufficient documentation

## 2013-04-17 DIAGNOSIS — R609 Edema, unspecified: Secondary | ICD-10-CM | POA: Insufficient documentation

## 2013-04-17 DIAGNOSIS — R1031 Right lower quadrant pain: Secondary | ICD-10-CM | POA: Insufficient documentation

## 2013-04-17 HISTORY — DX: Disorder of thyroid, unspecified: E07.9

## 2013-04-17 LAB — COMPREHENSIVE METABOLIC PANEL
ALT: 17 U/L (ref 0–35)
AST: 18 U/L (ref 0–37)
Albumin: 3.6 g/dL (ref 3.5–5.2)
Alkaline Phosphatase: 48 U/L (ref 39–117)
BUN: 25 mg/dL — ABNORMAL HIGH (ref 6–23)
CO2: 25 mEq/L (ref 19–32)
Calcium: 9.3 mg/dL (ref 8.4–10.5)
Chloride: 106 mEq/L (ref 96–112)
Creatinine, Ser: 1.26 mg/dL — ABNORMAL HIGH (ref 0.50–1.10)
GFR calc Af Amer: 48 mL/min — ABNORMAL LOW (ref >90)
GFR calc non Af Amer: 42 mL/min — ABNORMAL LOW (ref >90)
Glucose, Bld: 101 mg/dL — ABNORMAL HIGH (ref 70–99)
Potassium: 5.4 mEq/L — ABNORMAL HIGH (ref 3.5–5.1)
Sodium: 139 mEq/L (ref 135–145)
Total Bilirubin: 0.6 mg/dL (ref 0.3–1.2)
Total Protein: 6.5 g/dL (ref 6.0–8.3)

## 2013-04-17 LAB — CBC WITH DIFFERENTIAL/PLATELET
Basophils Absolute: 0 10*3/uL (ref 0.0–0.1)
Basophils Relative: 1 % (ref 0–1)
Eosinophils Absolute: 0.1 10*3/uL (ref 0.0–0.7)
Eosinophils Relative: 2 % (ref 0–5)
HCT: 32.9 % — ABNORMAL LOW (ref 36.0–46.0)
Hemoglobin: 10.9 g/dL — ABNORMAL LOW (ref 12.0–15.0)
Lymphocytes Relative: 17 % (ref 12–46)
Lymphs Abs: 0.9 10*3/uL (ref 0.7–4.0)
MCH: 32.7 pg (ref 26.0–34.0)
MCHC: 33.1 g/dL (ref 30.0–36.0)
MCV: 98.8 fL (ref 78.0–100.0)
Monocytes Absolute: 0.6 10*3/uL (ref 0.1–1.0)
Monocytes Relative: 10 % (ref 3–12)
Neutro Abs: 3.8 10*3/uL (ref 1.7–7.7)
Neutrophils Relative %: 70 % (ref 43–77)
Platelets: 186 10*3/uL (ref 150–400)
RBC: 3.33 MIL/uL — ABNORMAL LOW (ref 3.87–5.11)
RDW: 12.9 % (ref 11.5–15.5)
WBC: 5.5 10*3/uL (ref 4.0–10.5)

## 2013-04-17 LAB — URINALYSIS, ROUTINE W REFLEX MICROSCOPIC
Bilirubin Urine: NEGATIVE
Glucose, UA: NEGATIVE mg/dL
Hgb urine dipstick: NEGATIVE
Ketones, ur: NEGATIVE mg/dL
Leukocytes, UA: NEGATIVE
Nitrite: NEGATIVE
Protein, ur: NEGATIVE mg/dL
Specific Gravity, Urine: 1.012 (ref 1.005–1.030)
Urobilinogen, UA: 0.2 mg/dL (ref 0.0–1.0)
pH: 7 (ref 5.0–8.0)

## 2013-04-17 MED ORDER — IOHEXOL 300 MG/ML  SOLN
25.0000 mL | INTRAMUSCULAR | Status: AC
  Administered 2013-04-17: 25 mL via ORAL

## 2013-04-17 MED ORDER — SODIUM POLYSTYRENE SULFONATE 15 GM/60ML PO SUSP
15.0000 g | Freq: Once | ORAL | Status: AC
  Administered 2013-04-17: 15 g via ORAL
  Filled 2013-04-17: qty 60

## 2013-04-17 NOTE — ED Notes (Signed)
Pt reports heart palpitations too

## 2013-04-17 NOTE — ED Notes (Signed)
Pt has been recently told that her potassium is high and kidney functions are high and she is waiting to see a kidney specialist

## 2013-04-17 NOTE — ED Provider Notes (Signed)
History    CSN: [id number] Arrival date & time 04/17/13  1478  First MD Initiated Contact with Patient 04/17/13 0820     No chief complaint on file.  (Consider location/radiation/quality/duration/timing/severity/associated sxs/prior Treatment) HPI Pt is a 72yo female with hx of HTN and thyroid disease c/o leg cramps and nausea.  Pt states over the past 2-3 months she has experience intermittent aching and cramping with bilateral leg swelling, pain is 4/10 most of the time but experiences bursts of increased pain 8/10, muscles feel like they sharply contract, however pain became even more severe last night and this morning.  Nausea and pain prevented pt from sleeping last night. Pain is most intense in RLQ and radiates to lower back, but states she had her appendix removed many years ago and denies hx of kidney stones or urinary symptoms. Pt has been follow by her PCP since March for same and noticed to have elevated potassium, decreasing kidney function and is in the process of being scheduled for an appointment with [facility]. She states she has not been taking any specific pain medication for cramps but does states she was on Mobic and recently taken off due to fear of medication causing abnormal labs and leg swelling. Pt also reports occasional episodes of racing heart that lasts a few seconds. Does not feel like her heart skips any beats.  No chest pain or SOB.  No known cardiopulmonary hx.    Past Medical History  Diagnosis Date  . Colon polyps 06-15-07  . Hypertension   . Osteoporosis   . Allergy   . Thyroid disease    Past Surgical History  Procedure Laterality Date  . Partial hysterectomy  age 59  . Appendectomy    . Vaginal hysterectomy  age 13    partial  . Cesarean section      x 2   Family History  Problem Relation Age of Onset  . Coronary artery disease Mother   . Other Mother     bypass surgery  . Osteoarthritis Father   . Hypertension Brother   .  Heart attack Maternal Grandfather   . Lung disease Brother    History  Substance Use Topics  . Smoking status: Former Smoker    Quit date: 10/07/2001  . Smokeless tobacco: Not on file  . Alcohol Use: No   OB History   Grav Para Term Preterm Abortions TAB SAB Ect Mult Living                 Review of Systems  Constitutional: Positive for fatigue. Negative for chills, diaphoresis, appetite change and unexpected weight change.  Respiratory: Negative for shortness of breath.   Cardiovascular: Positive for palpitations ( racing heart at times) and leg swelling ( bilateral). Negative for chest pain.  Gastrointestinal: Positive for nausea and abdominal pain ( RLQ). Negative for vomiting and diarrhea.  Musculoskeletal: Positive for myalgias and back pain ( Lower back, R>L).  All other systems reviewed and are negative.    Allergies  Codeine phosphate  Home Medications   Current Outpatient Rx  Name  Route  Sig  Dispense  Refill  . ALPRAZolam (XANAX) 0.25 MG tablet   Oral   Take 0.25 mg by mouth at bedtime as needed for sleep.         Marland Kitchen aspirin (ADULT ASPIRIN EC LOW STRENGTH) 81 MG EC tablet      Take two tablets by mouth every morning         .  atorvastatin (LIPITOR) 20 MG tablet   Oral   Take 1 tablet (20 mg total) by mouth daily.   30 tablet   5   . benazepril (LOTENSIN) 10 MG tablet   Oral   Take 1 tablet (10 mg total) by mouth daily.   30 tablet   5   . Calcium Citrate-Vitamin D (CITRACAL/VITAMIN D) 250-200 MG-UNIT TABS   Oral   Take by mouth.           Di Kindle SULFATE PO   Oral   Take 1 tablet by mouth daily. 5 grams.  Take one tablet at bedtime with orange juice         . Glucosamine HCl (GLUCOSAMINE MAXIMUM STRENGTH PO)   Oral   Take 1 tablet by mouth 2 (two) times daily. Take one by mouth twice a day         . levothyroxine (SYNTHROID, LEVOTHROID) 100 MCG tablet   Oral   Take 1 tablet (100 mcg total) by mouth daily.   30 tablet   5   .  meloxicam (MOBIC) 7.5 MG tablet   Oral   Take 7.5 mg by mouth daily.         . multivitamin (THERAGRAN) per tablet   Oral   Take 1 tablet by mouth daily.           . Omega-3 Fatty Acids (FISH OIL) 1000 MG CAPS   Oral   Take by mouth. Take two daily          . oxyCODONE-acetaminophen (PERCOCET/ROXICET) 5-325 MG per tablet   Oral   Take 1 tablet by mouth every 4 (four) hours as needed for pain.         . valACYclovir (VALTREX) 1000 MG tablet      TAKE ONE (1) TABLET BY MOUTH EVERY DAY   30 tablet   0   . valACYclovir (VALTREX) 1000 MG tablet   Oral   Take 1,000 mg by mouth 2 (two) times daily as needed. For cold sores          BP 126/69  Pulse 50  Temp(Src) 98.3 F (36.8 C) (Oral)  Resp 21  SpO2 97% Physical Exam  Nursing note and vitals reviewed. Constitutional: She appears well-developed and well-nourished.  Pt sitting comfortably in exam bed, NAD.     HENT:  Head: Normocephalic and atraumatic.  Eyes: Conjunctivae are normal. No scleral icterus.  Neck: Normal range of motion.  Cardiovascular: Normal rate, regular rhythm, normal heart sounds and intact distal pulses.   Pulmonary/Chest: Effort normal and breath sounds normal. No respiratory distress. She has no wheezes. She has no rales. She exhibits no tenderness.  Abdominal: Soft. Bowel sounds are normal. She exhibits no distension and no mass. There is tenderness ( RLQ). There is no rebound and no guarding.  Musculoskeletal: Normal range of motion. She exhibits edema ( 1+ bilateral pedal edema to mid-tibia ).  Neurological: She is alert.  Skin: Skin is warm and dry.    ED Course  Procedures (including critical care time) Labs Reviewed  CBC WITH DIFFERENTIAL - Abnormal; Notable for the following:    RBC 3.33 (*)    Hemoglobin 10.9 (*)    HCT 32.9 (*)    All other components within normal limits  COMPREHENSIVE METABOLIC PANEL - Abnormal; Notable for the following:    Potassium 5.4 (*)    Glucose,  Bld 101 (*)    BUN 25 (*)    Creatinine,  Ser 1.26 (*)    GFR calc non Af Amer 42 (*)    GFR calc Af Amer 48 (*)    All other components within normal limits  URINALYSIS, ROUTINE W REFLEX MICROSCOPIC   Ct Abdomen Pelvis Wo Contrast  04/17/2013   *RADIOLOGY REPORT*  Clinical Data: Right lower quadrant abdominal pain.  History of prior appendectomy and hysterectomy.  CT ABDOMEN AND PELVIS WITHOUT CONTRAST  Technique:  Multidetector CT imaging of the abdomen and pelvis was performed following the standard protocol without intravenous contrast.  Comparison: None.  Findings: No renal calculi or obstruction identified.  No acute inflammatory process or abnormal fluid collection is seen.  There is a moderate sized hiatal hernia.  Unenhanced appearance of the liver, gallbladder, pancreas, spleen and adrenal glands are unremarkable.  No masses or enlarged lymph nodes are seen.  Bladder is unremarkable.  Calcified plaque is present in the distal aorta and iliac arteries without evidence of aneurysm.  IMPRESSION: No acute findings.   Original Report Authenticated By: Irish Lack, M.D.    Date: 04/17/2013  Rate: 54  Rhythm: sinus bradycardia  QRS Axis: normal  Intervals: normal  ST/T Wave abnormalities: normal  Conduction Disutrbances:none  Narrative Interpretation:   Old EKG Reviewed: unchanged   1. Leg cramps   2. Abdominal cramps   3. Hyperkalemia     MDM  Pt with known hyperkalemia and anemia presented with increased in muscle cramps and nausea, which has mostly resolved upon arrival except for RLQ pain and some bilateral leg pain.  Labs: CBC, CMP, and UA  CBC: consistent with pt's baseline CMP: consistent with baseline, still hyperkalemic-5.4 UA: unremarkable  EKG: sinus brady, no arrhythmia or T-wave changes   CT abd: normal, no signs of diverticulitis, no aortic aneurysm     Will discharge pt home and suggest she calls PCP or [facility] in a.m. To request appointment  ASAP for worsening muscle cramps and continued increase in potassium.  Pt was given dose of kayexalate before discharge in attempt to help decrease potassium level. Return precautions given. Pt verbalized understanding and agreement with tx plan. Vitals: unremarkable. Discharged in stable condition.    Discussed pt with attending during ED encounter.   Junius Finner, PA-C 04/17/13 1401

## 2013-04-17 NOTE — ED Notes (Signed)
Pt states that she has been cramping in legs, numbness in arms, and reports nausea.  Pt is having right lower abdominal pain that radiates into her back.

## 2013-04-17 NOTE — ED Notes (Signed)
Pt discharged to home with family. NAD.  

## 2013-04-18 ENCOUNTER — Telehealth: Payer: Self-pay | Admitting: Family Medicine

## 2013-04-18 NOTE — Telephone Encounter (Signed)
Carla Lyons, can her appointment be moved up?

## 2013-04-18 NOTE — Telephone Encounter (Signed)
Patient was in [facility] ER over the weekend and they told her she needs to see a Kidney doctor in the next 1 - 3 days.

## 2013-04-18 NOTE — Telephone Encounter (Signed)
Called the patient and now she wants to go to the [facility] instead of [facility]. She doesn't want to wait months to get an appt. I will call [facility] and cancel the referral that we sent over last week.

## 2013-04-19 NOTE — ED Provider Notes (Signed)
Medical screening examination/treatment/procedure(s) were conducted as a shared visit with non-physician practitioner(s) and myself.  I personally evaluated the patient during the encounter  Derwood Kaplan, MD 04/19/13 0246

## 2013-04-21 ENCOUNTER — Other Ambulatory Visit (INDEPENDENT_AMBULATORY_CARE_PROVIDER_SITE_OTHER): Payer: Medicare Other

## 2013-04-21 DIAGNOSIS — N289 Disorder of kidney and ureter, unspecified: Secondary | ICD-10-CM

## 2013-04-21 LAB — BASIC METABOLIC PANEL
BUN: 27 mg/dL — ABNORMAL HIGH (ref 6–23)
CO2: 26 mEq/L (ref 19–32)
Calcium: 9.1 mg/dL (ref 8.4–10.5)
Chloride: 106 mEq/L (ref 96–112)
Creatinine, Ser: 1.2 mg/dL (ref 0.4–1.2)
GFR: 45.31 mL/min — ABNORMAL LOW (ref >60.00)
Glucose, Bld: 96 mg/dL (ref 70–99)
Potassium: 4.8 mEq/L (ref 3.5–5.1)
Sodium: 138 mEq/L (ref 135–145)

## 2013-05-13 ENCOUNTER — Telehealth: Payer: Self-pay | Admitting: Hematology and Oncology

## 2013-05-13 NOTE — Telephone Encounter (Signed)
Due to CP1 out 8/13 to 9/2 pt 8/13 appt moved to Carla Lyons. S/w pt re new time for 8/13 @ 2:45pm. Per desk appt must be kept.

## 2013-05-18 ENCOUNTER — Other Ambulatory Visit: Payer: Medicare Other | Admitting: Lab

## 2013-05-18 ENCOUNTER — Ambulatory Visit (HOSPITAL_BASED_OUTPATIENT_CLINIC_OR_DEPARTMENT_OTHER): Payer: Medicare Other | Admitting: Physician Assistant

## 2013-05-18 ENCOUNTER — Other Ambulatory Visit (HOSPITAL_BASED_OUTPATIENT_CLINIC_OR_DEPARTMENT_OTHER): Payer: Medicare Other | Admitting: Lab

## 2013-05-18 ENCOUNTER — Ambulatory Visit: Payer: Medicare Other

## 2013-05-18 VITALS — BP 144/70 | HR 63 | Temp 97.0°F | Resp 18 | Ht 63.0 in | Wt 164.5 lb

## 2013-05-18 DIAGNOSIS — D631 Anemia in chronic kidney disease: Secondary | ICD-10-CM | POA: Insufficient documentation

## 2013-05-18 DIAGNOSIS — D649 Anemia, unspecified: Secondary | ICD-10-CM

## 2013-05-18 DIAGNOSIS — I10 Essential (primary) hypertension: Secondary | ICD-10-CM

## 2013-05-18 DIAGNOSIS — N289 Disorder of kidney and ureter, unspecified: Secondary | ICD-10-CM

## 2013-05-18 DIAGNOSIS — D539 Nutritional anemia, unspecified: Secondary | ICD-10-CM

## 2013-05-18 LAB — CBC WITH DIFFERENTIAL/PLATELET
BASO%: 1.1 % (ref 0.0–2.0)
Basophils Absolute: 0.1 10*3/uL (ref 0.0–0.1)
EOS%: 4.9 % (ref 0.0–7.0)
Eosinophils Absolute: 0.3 10*3/uL (ref 0.0–0.5)
HCT: 31.1 % — ABNORMAL LOW (ref 34.8–46.6)
HGB: 10.5 g/dL — ABNORMAL LOW (ref 11.6–15.9)
LYMPH%: 21.6 % (ref 14.0–49.7)
MCH: 33.6 pg (ref 25.1–34.0)
MCHC: 33.6 g/dL (ref 31.5–36.0)
MCV: 99.7 fL (ref 79.5–101.0)
MONO#: 0.6 10*3/uL (ref 0.1–0.9)
MONO%: 10.3 % (ref 0.0–14.0)
NEUT#: 3.5 10*3/uL (ref 1.5–6.5)
NEUT%: 62.1 % (ref 38.4–76.8)
Platelets: 176 10*3/uL (ref 145–400)
RBC: 3.12 10*6/uL — ABNORMAL LOW (ref 3.70–5.45)
RDW: 13.2 % (ref 11.2–14.5)
WBC: 5.6 10*3/uL (ref 3.9–10.3)
lymph#: 1.2 10*3/uL (ref 0.9–3.3)

## 2013-05-18 LAB — BASIC METABOLIC PANEL (CC13)
BUN: 25.9 mg/dL (ref 7.0–26.0)
CO2: 24 mEq/L (ref 22–29)
Calcium: 8.7 mg/dL (ref 8.4–10.4)
Chloride: 110 mEq/L — ABNORMAL HIGH (ref 98–109)
Creatinine: 1.6 mg/dL — ABNORMAL HIGH (ref 0.6–1.1)
Glucose: 118 mg/dl (ref 70–140)
Potassium: 4.6 mEq/L (ref 3.5–5.1)
Sodium: 144 mEq/L (ref 136–145)

## 2013-05-18 LAB — VITAMIN B12: Vitamin B-12: 415 pg/mL (ref 211–911)

## 2013-05-19 LAB — FERRITIN CHCC: Ferritin: 24 ng/ml (ref 9–269)

## 2013-05-23 NOTE — Progress Notes (Signed)
OFFICE PROGRESS NOTE   05/18/2013  Physicians:Aron, Bryn Gulling, MD (PCP, [facility]),Spencer Denver, Melvia Heaps, (Tom Wall), [place] (podiatry)   INTERVAL HISTORY:   Patient is seen, together with daughter, in follow up of anemia. Evaluation with initial consultation visit in April included Hgb 10.3 with MCV 102.8 and other counts ok, creatinine 1.3, ferritin 15, folate 15.7, B12 333, SPEP with no M spike, urinalysis negative blood, hemoccults negative x3 and erythropoietin level of 16. She also had negative venous dopplers due to swelling in LE. She has history of donating blood 2-3x yearly for 20 years. Since last being seen, she was taken off of her Mobic as well as told to take her iron supplement with water and several orange juice. Both of these measures were done due 2 hypercalcemia. She is followed by a Dr. Edrick Kins from [facility] at her [place] office. She reports that she has a followup visit with him next week. She was also found to have a low vitamin D level and is currently taking 50,000 units by mouth monthly for the next 6 month this is also followed by Dr. Edrick Kins.   She is tolerating the oral iron with no difficulty, with the exception of taking it with water as opposed orange juice do to her history of hyperkalemia. She feels much better since foot discomfort has improved, with excellent energy. Swelling in LE and feet is better. No bleeding. No fever or symptoms of infection. No other new pain. Bowels ok. No sweats. No respiratory symptoms. Remainder of 10 point Review of Systems negative.  Objective:  Vital signs in last 24 hours:  BP 144/70  Pulse 63  Temp(Src) 97 F (36.1 C) (Oral)  Resp 18  Ht 5\' 3"  (1.6 m)  Wt 164 lb 8 oz (74.617 kg)  BMI 29.15 kg/m2  Weight is up 3.8 lbs. Easily mobile, looks comfortable. Respirations not labored RA.  HEENT:PERRLA, sclera clear, anicteric and oropharynx clear, no lesions LymphaticsCervical,  supraclavicular, and axillary nodes normal. Resp: clear to auscultation bilaterally and normal percussion bilaterally Cardio: regular rate and rhythm GI: soft, non-tender; bowel sounds normal; no masses,  no organomegaly Extremities: trace pedal edema bilaterally, no cords or tenderness in LE Neuro:no focal deficits    Lab Results:  Results for orders placed in visit on 05/18/13  CBC WITH DIFFERENTIAL      Result Value Range   WBC 5.6  3.9 - 10.3 10e3/uL   NEUT# 3.5  1.5 - 6.5 10e3/uL   HGB 10.5 (*) 11.6 - 15.9 g/dL   HCT 47.8 (*) 29.5 - 62.1 %   Platelets 176  145 - 400 10e3/uL   MCV 99.7  79.5 - 101.0 fL   MCH 33.6  25.1 - 34.0 pg   MCHC 33.6  31.5 - 36.0 g/dL   RBC 3.08 (*) 6.57 - 8.46 10e6/uL   RDW 13.2  11.2 - 14.5 %   lymph# 1.2  0.9 - 3.3 10e3/uL   MONO# 0.6  0.1 - 0.9 10e3/uL   Eosinophils Absolute 0.3  0.0 - 0.5 10e3/uL   Basophils Absolute 0.1  0.0 - 0.1 10e3/uL   NEUT% 62.1  38.4 - 76.8 %   LYMPH% 21.6  14.0 - 49.7 %   MONO% 10.3  0.0 - 14.0 %   EOS% 4.9  0.0 - 7.0 %   BASO% 1.1  0.0 - 2.0 %  VITAMIN B12      Result Value Range   Vitamin B-12 415  211 - 911 pg/mL  FERRITIN [facility]      Result Value Range   Ferritin 24  9 - 269 ng/ml  BASIC METABOLIC PANEL (CC13)      Result Value Range   Sodium 144  136 - 145 mEq/L   Potassium 4.6  3.5 - 5.1 mEq/L   Chloride 110 (*) 98 - 109 mEq/L   CO2 24  22 - 29 mEq/L   Glucose 118  70 - 140 mg/dl   BUN 16.1  7.0 - 09.6 mg/dL   Creatinine 1.6 (*) 0.6 - 1.1 mg/dL   Calcium 8.7  8.4 - 04.5 mg/dL    Hemoglobin is up from 10.3 on 01-28-13  Studies/Results:  No results found.  Medications: I have reviewed the patient's current medications. She will continue oral iron as above.  The patient was reviewed with Dr. Malvin Johns tonight. Her B12 was in normal range at 415 in her ferritin was in the low-normal range at 24. 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 We're recommending that the patient continue on her oral iron however at this point we will release the patient to her primary care physician for further followup. Should her services be needed in the future will be happy to reevaluate this patient.  2 renal insufficiency-followed by [facility] 3.hypothyroid on replacement 4.hx colon polyps: HC negative, for colonoscopy Oct 2014 5.HTN and bradycardia 6.family hx pulmonary fibrosis in 3 siblings  Patient and daughter are in agreement with plan above  Laural Benes, Benjamyn Hestand E, PA-C

## 2013-05-23 NOTE — Patient Instructions (Addendum)
Your anemia is stable, you may continue your oral iron supplement taken with water. You have been released to your primary care physician for further followup of your anemia. Should you need our services in the future we will be happy to reevaluate you

## 2013-05-27 ENCOUNTER — Telehealth: Payer: Self-pay | Admitting: *Deleted

## 2013-05-27 NOTE — Telephone Encounter (Signed)
Yes I am ok with this 

## 2013-05-27 NOTE — Telephone Encounter (Signed)
Pt says she is scheduled to come in for labs on 9/4 to recheck her thyroid. She is also scheduled for labs that same day from her nephrologist. She want's to know if she can come in a week prior or a week after nephrology labs. Would either time make a difference since she recently had her thyroid dose adjusted. She would like [place] to schedule her lab appt and call her with it.

## 2013-06-03 NOTE — Telephone Encounter (Signed)
Patient advised, I rescheduled her lab appointment at her request.

## 2013-06-09 ENCOUNTER — Other Ambulatory Visit: Payer: Medicare Other

## 2013-06-16 ENCOUNTER — Other Ambulatory Visit: Payer: Medicare Other

## 2013-06-16 ENCOUNTER — Ambulatory Visit (INDEPENDENT_AMBULATORY_CARE_PROVIDER_SITE_OTHER): Payer: Medicare Other | Admitting: Family Medicine

## 2013-06-16 ENCOUNTER — Encounter: Payer: Self-pay | Admitting: Family Medicine

## 2013-06-16 VITALS — BP 136/72 | HR 68 | Temp 98.2°F | Ht 63.0 in | Wt 159.0 lb

## 2013-06-16 DIAGNOSIS — R609 Edema, unspecified: Secondary | ICD-10-CM

## 2013-06-16 DIAGNOSIS — D539 Nutritional anemia, unspecified: Secondary | ICD-10-CM

## 2013-06-16 DIAGNOSIS — Z1211 Encounter for screening for malignant neoplasm of colon: Secondary | ICD-10-CM

## 2013-06-16 DIAGNOSIS — R600 Localized edema: Secondary | ICD-10-CM

## 2013-06-16 DIAGNOSIS — E782 Mixed hyperlipidemia: Secondary | ICD-10-CM

## 2013-06-16 DIAGNOSIS — Z860100 Personal history of colon polyps, unspecified: Secondary | ICD-10-CM

## 2013-06-16 DIAGNOSIS — E039 Hypothyroidism, unspecified: Secondary | ICD-10-CM

## 2013-06-16 DIAGNOSIS — D649 Anemia, unspecified: Secondary | ICD-10-CM

## 2013-06-16 DIAGNOSIS — I10 Essential (primary) hypertension: Secondary | ICD-10-CM

## 2013-06-16 DIAGNOSIS — N289 Disorder of kidney and ureter, unspecified: Secondary | ICD-10-CM

## 2013-06-16 DIAGNOSIS — Z23 Encounter for immunization: Secondary | ICD-10-CM

## 2013-06-16 LAB — BASIC METABOLIC PANEL WITH GFR
BUN: 24 mg/dL — ABNORMAL HIGH (ref 6–23)
CO2: 26 meq/L (ref 19–32)
Calcium: 9.3 mg/dL (ref 8.4–10.5)
Chloride: 106 meq/L (ref 96–112)
Creatinine, Ser: 1.3 mg/dL — ABNORMAL HIGH (ref 0.4–1.2)
GFR: 44.46 mL/min — ABNORMAL LOW (ref >60.00)
Glucose, Bld: 97 mg/dL (ref 70–99)
Potassium: 4.7 meq/L (ref 3.5–5.1)
Sodium: 140 meq/L (ref 135–145)

## 2013-06-16 LAB — CBC WITH DIFFERENTIAL/PLATELET
Basophils Absolute: 0 K/uL (ref 0.0–0.1)
Basophils Relative: 0.5 % (ref 0.0–3.0)
Eosinophils Absolute: 0.2 K/uL (ref 0.0–0.7)
Eosinophils Relative: 3.1 % (ref 0.0–5.0)
HCT: 33.3 % — ABNORMAL LOW (ref 36.0–46.0)
Hemoglobin: 11.4 g/dL — ABNORMAL LOW (ref 12.0–15.0)
Lymphocytes Relative: 18.6 % (ref 12.0–46.0)
Lymphs Abs: 1.1 K/uL (ref 0.7–4.0)
MCHC: 34.1 g/dL (ref 30.0–36.0)
MCV: 97.8 fl (ref 78.0–100.0)
Monocytes Absolute: 0.6 K/uL (ref 0.1–1.0)
Monocytes Relative: 10.9 % (ref 3.0–12.0)
Neutro Abs: 4 K/uL (ref 1.4–7.7)
Neutrophils Relative %: 66.9 % (ref 43.0–77.0)
Platelets: 171 K/uL (ref 150.0–400.0)
RBC: 3.41 Mil/uL — ABNORMAL LOW (ref 3.87–5.11)
RDW: 12.8 % (ref 11.5–14.6)
WBC: 5.9 K/uL (ref 4.5–10.5)

## 2013-06-16 LAB — T4, FREE: Free T4: 1.4 ng/dL (ref 0.60–1.60)

## 2013-06-16 LAB — TSH: TSH: 0.51 u[IU]/mL (ref 0.35–5.50)

## 2013-06-16 NOTE — Addendum Note (Signed)
Addended by: Eliezer Bottom on: 06/16/2013 09:22 AM   Modules accepted: Orders

## 2013-06-16 NOTE — Patient Instructions (Addendum)
Good to see you. We will call you with a colonoscopy appointment.  Try wearing your compression hose during the day.  We will also call you with your lab results and you can view them online.

## 2013-06-16 NOTE — Progress Notes (Signed)
72 yo female with h/o hypothyroidism, CRI,  and LE neuropathy here for recurrent LE edema in feet and legs.  Was seen for this in 12/2012 by me and then again by Dr. Patsy Lager.  Venous dopplers were neg in 02/2013.    No known injury.  She has been caring for her mother- on her feet all days.   No CP or SOB.  Swelling has improved since March but still occuring during the day, getting worse at night.  Hypothyroidism- denies any symptoms of hypo or hyper thyroidism. On Levothroid 100 mch daily.   Lab Results  Component Value Date   TSH 1.00 03/24/2013   She was found to be anemic and referred to hematology. Lab Results  Component Value Date   WBC 5.6 05/18/2013   HGB 10.5* 05/18/2013   HCT 31.1* 05/18/2013   MCV 99.7 05/18/2013   PLT 176 05/18/2013   Last note reviewed from Dr. Malvin Johns (05/23/2013).  Advised continued iron supplementation and she was discharged from hematology- felt she could now be periodically monitored by me.  Will be due for repeat colonoscopy in 07/2013.  Denies blood in her stool.  Saw her eye doctor last month- does have a retinal hemorrhage felt to be due to anemia.  Patient Active Problem List   Diagnosis Date Noted  . Edema, lower extremity 06/16/2013  . Unspecified deficiency anemia 05/18/2013  . Renal insufficiency 03/31/2013  . ALLERGIC RHINITIS CAUSE UNSPECIFIED 11/13/2010  . POSTMENOPAUSAL STATUS 11/03/2008  . HYPERTENSION 09/15/2007  . OSTEOPOROSIS 09/15/2007  . ANEMIA NOS 05/26/2007  . HYPOTHYROIDISM 05/17/2007  . DEPRESSION 05/17/2007  . 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 Colon polyps 06-15-07  . Hypertension   . Osteoporosis   . Allergy   . Thyroid disease    Past Surgical History  Procedure Laterality Date  . Partial hysterectomy  age 55  . Appendectomy    . Vaginal hysterectomy  age 46    partial  . Cesarean section      x 2   History  Substance Use Topics  . Smoking status: Former Smoker   Quit date: 10/07/2001  . Smokeless tobacco: Not on file  . Alcohol Use: No   Family History  Problem Relation Age of Onset  . Coronary artery disease Mother   . Other Mother     bypass surgery  . Osteoarthritis Father   . Hypertension Brother   . Heart attack Maternal Grandfather   . Lung disease Brother    Allergies  Allergen Reactions  . Codeine Phosphate     REACTION: Nausea/vomiting   Current Outpatient Prescriptions on File Prior to Visit  Medication Sig Dispense Refill  . ALPRAZolam (XANAX) 0.25 MG tablet Take 0.25 mg by mouth at bedtime as needed for sleep.      Marland Kitchen aspirin (ADULT ASPIRIN EC LOW STRENGTH) 81 MG EC tablet Take 81 mg by mouth daily.       Marland Kitchen atorvastatin (LIPITOR) 20 MG tablet Take 1 tablet (20 mg total) by mouth daily.  30 tablet  5  . benazepril (LOTENSIN) 10 MG tablet Take 1 tablet (10 mg total) by mouth daily.  30 tablet  5  . Calcium Citrate-Vitamin D (CITRACAL/VITAMIN D) 250-200 MG-UNIT TABS Take by mouth.        Di Kindle SULFATE PO Take 1 tablet by mouth daily. 5 grams.  Take one tablet at bedtime with orange juice      . Glucosamine  HCl (GLUCOSAMINE MAXIMUM STRENGTH PO) Take 1 tablet by mouth 2 (two) times daily. Take one by mouth twice a day      . levothyroxine (SYNTHROID, LEVOTHROID) 100 MCG tablet Take 1 tablet (100 mcg total) by mouth daily.  30 tablet  5  . multivitamin (THERAGRAN) per tablet Take 1 tablet by mouth daily.        . Omega-3 Fatty Acids (FISH OIL) 1000 MG CAPS Take by mouth. Take two daily       . oxyCODONE-acetaminophen (PERCOCET/ROXICET) 5-325 MG per tablet Take 1 tablet by mouth every 4 (four) hours as needed for pain.      . valACYclovir (VALTREX) 1000 MG tablet Take 1,000 mg by mouth 2 (two) times daily as needed. For cold sores      . Vitamin D, Ergocalciferol, (DRISDOL) 50000 UNITS CAPS capsule Take 50,000 Units by mouth every 30 (thirty) days. Started in August, is to take for 6 months       No current facility-administered  medications on file prior to visit.   The PMH, PSH, Social History, Family History, Medications, and allergies have been reviewed in [facility], and have been updated if relevant.  ROS: See HPI  Physical exam: BP 136/72  Temp(Src) 98.2 F (36.8 C)  Ht 5\' 3"  (1.6 m)  Wt 159 lb (72.122 kg)  BMI 28.17 kg/m2  General:  Well-developed,well-nourished,in no acute distress; alert,appropriate and cooperative throughout examination Head:  normocephalic and atraumatic.   Eyes:  vision grossly intact, pupils equal, pupils round, and pupils reactive to light.   Ears:  R ear normal and L ear normal.   Nose:  no external deformity.   Mouth:  good dentition.   Lungs:  Normal respiratory effort, chest expands symmetrically. Lungs are clear to auscultation, no crackles or wheezes. Heart:  Normal rate and regular rhythm. S1 and S2 normal without gallop, murmur, click, rub or other extra sounds. Abdomen:  Bowel sounds positive,abdomen soft and non-tender without masses, organomegaly or hernias noted. Msk:  No deformity or scoliosis noted of thoracic or lumbar spine.   NO edema bilateral extremities Extremities:  No clubbing, cyanosis, edema, or deformity noted with normal full range of motion of all joints.   Neurologic:  alert & oriented X3 and gait normal.   Skin:  Intact without suspicious lesions or rashes Psych:  Cognition and judgment appear intact. Alert and cooperative with normal attention span and concentration. No apparent delusions, illusions, hallucinations  Assessment and Plan: 1.  Edema Dependent- no edema on exam today. Lungs clear- will recheck labs today.   Given rx for compression hose.    - CBC with Differential - Basic Metabolic Panel  2. Unspecified hypothyroidism  - T4, free - TSH  3. ANEMIA NOS Continue iron.  Will be due for colonoscopy- order entered. - Ambulatory referral to Gastroenterology  4. Special screening for malignant neoplasms, colon  - Ambulatory referral  to Gastroenterology  5. Personal history of colonic polyps  - Ambulatory referral to Gastroenterology

## 2013-06-28 ENCOUNTER — Other Ambulatory Visit: Payer: Self-pay | Admitting: *Deleted

## 2013-06-28 MED ORDER — ALPRAZOLAM 0.25 MG PO TABS: 0.2500 mg | ORAL_TABLET | Freq: Every evening | ORAL | Status: DC | PRN

## 2013-06-28 NOTE — Telephone Encounter (Signed)
Last filled 04/05/13 

## 2013-06-28 NOTE — Telephone Encounter (Signed)
Rx called in as directed.   

## 2013-07-18 ENCOUNTER — Encounter: Payer: Self-pay | Admitting: Internal Medicine

## 2013-07-20 ENCOUNTER — Encounter: Payer: Self-pay | Admitting: Internal Medicine

## 2013-08-18 ENCOUNTER — Ambulatory Visit (AMBULATORY_SURGERY_CENTER): Payer: Medicare Other | Admitting: *Deleted

## 2013-08-18 VITALS — Ht 63.0 in | Wt 162.4 lb

## 2013-08-18 DIAGNOSIS — Z86010 Personal history of colonic polyps: Secondary | ICD-10-CM

## 2013-08-18 MED ORDER — MOVIPREP 100 G PO SOLR: 1.0000 | Freq: Once | ORAL | Status: DC

## 2013-08-18 NOTE — Progress Notes (Signed)
Denies allergies to eggs or soy products. Denies complications with sedation or anesthesia. 

## 2013-08-22 ENCOUNTER — Encounter: Payer: Self-pay | Admitting: Internal Medicine

## 2013-08-30 ENCOUNTER — Encounter: Payer: Self-pay | Admitting: Internal Medicine

## 2013-08-30 ENCOUNTER — Ambulatory Visit (AMBULATORY_SURGERY_CENTER): Payer: Medicare Other | Admitting: Internal Medicine

## 2013-08-30 VITALS — BP 135/66 | HR 55 | Temp 96.7°F | Resp 35 | Ht 63.0 in | Wt 162.0 lb

## 2013-08-30 DIAGNOSIS — Z86010 Personal history of colonic polyps: Secondary | ICD-10-CM

## 2013-08-30 DIAGNOSIS — K5730 Diverticulosis of large intestine without perforation or abscess without bleeding: Secondary | ICD-10-CM

## 2013-08-30 DIAGNOSIS — D126 Benign neoplasm of colon, unspecified: Secondary | ICD-10-CM

## 2013-08-30 MED ORDER — SODIUM CHLORIDE 0.9 % IV SOLN: 500.0000 mL | INTRAVENOUS | Status: DC

## 2013-08-30 NOTE — Progress Notes (Signed)
Patient did not experience any of the following events: a burn prior to discharge; a fall within the facility; wrong site/side/patient/procedure/implant event; or a hospital transfer or hospital admission upon discharge from the facility. (G8907) Patient did not have preoperative order for IV antibiotic SSI prophylaxis. (G8918)  

## 2013-08-30 NOTE — Op Note (Signed)
[facility] [address] [place], [address]   COLONOSCOPY PROCEDURE REPORT  PATIENT: Carla Lyons, Sliva  MR#: [id number] BIRTHDATE: 1940/12/22 , 72  yrs. old GENDER: Female ENDOSCOPIST: Iva Boop, MD, [facility] PROCEDURE DATE:  08/30/2013 PROCEDURE:   Colonoscopy with snare polypectomy First Screening Colonoscopy - Avg.  risk and is 50 yrs.  old or older - No.  Prior Negative Screening - Now for repeat screening. N/A  History of Adenoma - Now for follow-up colonoscopy & has been > or = to 3 yrs.  Yes hx of adenoma.  Has been 3 or more years since last colonoscopy.  Polyps Removed Today? Yes. ASA CLASS:   Class II INDICATIONS:Patient's personal history of adenomatous colon polyps.  MEDICATIONS: propofol (Diprivan) 200mg  IV, MAC sedation, administered by CRNA, and These medications were titrated to patient response per physician's verbal order  DESCRIPTION OF PROCEDURE:   After the risks benefits and alternatives of the procedure were thoroughly explained, informed consent was obtained.  A digital rectal exam revealed no abnormalities of the rectum.   The LB [id number]  endoscope was introduced through the anus and advanced to the cecum, which was identified by both the appendix and ileocecal valve. No adverse events experienced.   The quality of the prep was excellent using Suprep  The instrument was then slowly withdrawn as the colon was fully examined.   COLON FINDINGS: Three sessile polyps were found in the ascending colon, at the hepatic flexure, and in the sigmoid colon.  A polypectomy was performed with a cold snare.  The resection was complete and the polyp tissue was completely retrieved.   There was severe diverticulosis noted in the sigmoid colon with associated angulation.   The colon mucosa was otherwise normal.  Retroflexed views revealed no abnormalities. The time to cecum=2 minutes 24 seconds.  Withdrawal time=10 minutes 33 seconds.  The scope  was withdrawn and the procedure completed. COMPLICATIONS: There were no complications.  ENDOSCOPIC IMPRESSION: 1.   Three sessile polyps were found in the ascending colon, at the hepatic flexure, and in the sigmoid colon; polypectomy was performed with a cold snare 2.   There was severe diverticulosis noted in the sigmoid colon 3.   The colon mucosa was otherwise normal - excellent prep  RECOMMENDATIONS: Timing of repeat colonoscopy will be determined by pathology findings in patient w/ hx adenomas beginning 2007   eSigned:  Iva Boop, MD, [facility] 08/30/2013 9:54 AM  cc: The Patient

## 2013-08-30 NOTE — Progress Notes (Signed)
Called to room to assist during endoscopic procedure.  Patient ID and intended procedure confirmed with present staff. Received instructions for my participation in the procedure from the performing physician.  

## 2013-08-30 NOTE — Progress Notes (Signed)
Report to pacu rn, vss, bbs=clear 

## 2013-08-30 NOTE — Patient Instructions (Addendum)
I found and removed 3 polyps that look benign. You also have a condition called diverticulosis - common and not usually a problem. Please read the handout provided.  I will let you know pathology results and when to have another routine colonoscopy by mail.  I appreciate the opportunity to care for you. Iva Boop, MD, [facility]   Discharge instructions given with verbal understanding. Handouts on polyps and diverticulosis. Resume previous medications. YOU HAD AN ENDOSCOPIC PROCEDURE TODAY AT THE [facility]: Refer to the procedure report that was given to you for any specific questions about what was found during the examination.  If the procedure report does not answer your questions, please call your gastroenterologist to clarify.  If you requested that your care partner not be given the details of your procedure findings, then the procedure report has been included in a sealed envelope for you to review at your convenience later.  YOU SHOULD EXPECT: Some feelings of bloating in the abdomen. Passage of more gas than usual.  Walking can help get rid of the air that was put into your GI tract during the procedure and reduce the bloating. If you had a lower endoscopy (such as a colonoscopy or flexible sigmoidoscopy) you may notice spotting of blood in your stool or on the toilet paper. If you underwent a bowel prep for your procedure, then you may not have a normal bowel movement for a few days.  DIET: Your first meal following the procedure should be a light meal and then it is ok to progress to your normal diet.  A half-sandwich or bowl of soup is an example of a good first meal.  Heavy or fried foods are harder to digest and may make you feel nauseous or bloated.  Likewise meals heavy in dairy and vegetables can cause extra gas to form and this can also increase the bloating.  Drink plenty of fluids but you should avoid alcoholic beverages for 24 hours.  ACTIVITY: Your care partner  should take you home directly after the procedure.  You should plan to take it easy, moving slowly for the rest of the day.  You can resume normal activity the day after the procedure however you should NOT DRIVE or use heavy machinery for 24 hours (because of the sedation medicines used during the test).    SYMPTOMS TO REPORT IMMEDIATELY: A gastroenterologist can be reached at any hour.  During normal business hours, 8:30 AM to 5:00 PM Monday through Friday, call [phone].  After hours and on weekends, please call the GI answering service at [phone] who will take a message and have the physician on call contact you.   Following lower endoscopy (colonoscopy or flexible sigmoidoscopy):  Excessive amounts of blood in the stool  Significant tenderness or worsening of abdominal pains  Swelling of the abdomen that is new, acute  Fever of 100F or higher FOLLOW UP: If any biopsies were taken you will be contacted by phone or by letter within the next 1-3 weeks.  Call your gastroenterologist if you have not heard about the biopsies in 3 weeks.  Our staff will call the home number listed on your records the next business day following your procedure to check on you and address any questions or concerns that you may have at that time regarding the information given to you following your procedure. This is a courtesy call and so if there is no answer at the home number and we have  not heard from you through the emergency physician on call, we will assume that you have returned to your regular daily activities without incident.  SIGNATURES/CONFIDENTIALITY: You and/or your care partner have signed paperwork which will be entered into your electronic medical record.  These signatures attest to the fact that that the information above on your After Visit Summary has been reviewed and is understood.  Full responsibility of the confidentiality of this discharge information lies with you and/or your  care-partner.

## 2013-08-31 ENCOUNTER — Telehealth: Payer: Self-pay | Admitting: *Deleted

## 2013-08-31 NOTE — Telephone Encounter (Signed)
  Follow up Call-  Call back number 08/30/2013  Post procedure Call Back phone  # [phone]  Permission to leave phone message Yes     Patient questions:  Do you have a fever, pain , or abdominal swelling? no Pain Score  0 *  Have you tolerated food without any problems? yes  Have you been able to return to your normal activities? yes  Do you have any questions about your discharge instructions: Diet   no Medications  no Follow up visit  no  Do you have questions or concerns about your Care? no  Actions: * If pain score is 4 or above: No action needed, pain <4.

## 2013-09-06 ENCOUNTER — Encounter: Payer: Self-pay | Admitting: Internal Medicine

## 2013-09-06 NOTE — Progress Notes (Signed)
Quick Note:  Diminutive sessile serrated polyp Repeat colon 2019 ______

## 2013-09-26 ENCOUNTER — Other Ambulatory Visit: Payer: Self-pay | Admitting: Family Medicine

## 2013-09-26 NOTE — Telephone Encounter (Signed)
Pt requesting medication refill. Last appt 06/16/13 with no future appts scheduled. pls advise

## 2013-09-26 NOTE — Telephone Encounter (Signed)
Rx phone into pharmacy as requested 

## 2013-09-26 NOTE — Telephone Encounter (Signed)
Ok to phone in xanax 

## 2013-11-03 ENCOUNTER — Ambulatory Visit: Payer: Medicare Other | Admitting: Family Medicine

## 2013-11-07 ENCOUNTER — Other Ambulatory Visit: Payer: Self-pay | Admitting: Family Medicine

## 2013-12-05 ENCOUNTER — Other Ambulatory Visit: Payer: Self-pay | Admitting: Family Medicine

## 2013-12-29 ENCOUNTER — Encounter: Payer: Self-pay | Admitting: Family Medicine

## 2014-01-05 ENCOUNTER — Other Ambulatory Visit: Payer: Self-pay | Admitting: Internal Medicine

## 2014-01-05 ENCOUNTER — Other Ambulatory Visit: Payer: Self-pay | Admitting: Family Medicine

## 2014-01-05 NOTE — Telephone Encounter (Signed)
Lm on pts vm informing her Rx has been called in to requested pharmacy 

## 2014-01-05 NOTE — Telephone Encounter (Signed)
Last filled 09/26/13--please advise pt has upcoming OV and lab appt--please advise

## 2014-01-05 NOTE — Telephone Encounter (Signed)
Refill one time only 

## 2014-03-02 ENCOUNTER — Other Ambulatory Visit (INDEPENDENT_AMBULATORY_CARE_PROVIDER_SITE_OTHER): Payer: Medicare Other

## 2014-03-02 ENCOUNTER — Encounter: Payer: Self-pay | Admitting: Family Medicine

## 2014-03-02 ENCOUNTER — Other Ambulatory Visit: Payer: Self-pay | Admitting: Family Medicine

## 2014-03-02 DIAGNOSIS — I10 Essential (primary) hypertension: Secondary | ICD-10-CM

## 2014-03-02 DIAGNOSIS — D649 Anemia, unspecified: Secondary | ICD-10-CM

## 2014-03-02 DIAGNOSIS — Z136 Encounter for screening for cardiovascular disorders: Secondary | ICD-10-CM

## 2014-03-02 DIAGNOSIS — N289 Disorder of kidney and ureter, unspecified: Secondary | ICD-10-CM

## 2014-03-02 DIAGNOSIS — E039 Hypothyroidism, unspecified: Secondary | ICD-10-CM

## 2014-03-02 DIAGNOSIS — E875 Hyperkalemia: Secondary | ICD-10-CM

## 2014-03-02 LAB — CBC WITH DIFFERENTIAL/PLATELET
Basophils Absolute: 0 10*3/uL (ref 0.0–0.1)
Basophils Relative: 0.4 % (ref 0.0–3.0)
Eosinophils Absolute: 0.2 10*3/uL (ref 0.0–0.7)
Eosinophils Relative: 4.9 % (ref 0.0–5.0)
HCT: 36 % (ref 36.0–46.0)
Hemoglobin: 12.1 g/dL (ref 12.0–15.0)
Lymphocytes Relative: 23.4 % (ref 12.0–46.0)
Lymphs Abs: 1.1 10*3/uL (ref 0.7–4.0)
MCHC: 33.5 g/dL (ref 30.0–36.0)
MCV: 98.9 fl (ref 78.0–100.0)
Monocytes Absolute: 0.5 10*3/uL (ref 0.1–1.0)
Monocytes Relative: 10.2 % (ref 3.0–12.0)
Neutro Abs: 3 10*3/uL (ref 1.4–7.7)
Neutrophils Relative %: 61.1 % (ref 43.0–77.0)
Platelets: 183 10*3/uL (ref 150.0–400.0)
RBC: 3.64 Mil/uL — ABNORMAL LOW (ref 3.87–5.11)
RDW: 12.9 % (ref 11.5–15.5)
WBC: 4.9 10*3/uL (ref 4.0–10.5)

## 2014-03-02 LAB — LIPID PANEL
Cholesterol: 180 mg/dL (ref 0–200)
HDL: 54 mg/dL (ref >39.00)
LDL Cholesterol: 94 mg/dL (ref 0–99)
Total CHOL/HDL Ratio: 3
Triglycerides: 162 mg/dL — ABNORMAL HIGH (ref 0.0–149.0)
VLDL: 32.4 mg/dL (ref 0.0–40.0)

## 2014-03-02 LAB — COMPREHENSIVE METABOLIC PANEL
ALT: 32 U/L (ref 0–35)
AST: 27 U/L (ref 0–37)
Albumin: 4.1 g/dL (ref 3.5–5.2)
Alkaline Phosphatase: 43 U/L (ref 39–117)
BUN: 30 mg/dL — ABNORMAL HIGH (ref 6–23)
CO2: 27 mEq/L (ref 19–32)
Calcium: 10 mg/dL (ref 8.4–10.5)
Chloride: 105 mEq/L (ref 96–112)
Creatinine, Ser: 1.6 mg/dL — ABNORMAL HIGH (ref 0.4–1.2)
GFR: 34.94 mL/min — ABNORMAL LOW (ref >60.00)
Glucose, Bld: 85 mg/dL (ref 70–99)
Potassium: 5.8 mEq/L — ABNORMAL HIGH (ref 3.5–5.1)
Sodium: 140 mEq/L (ref 135–145)
Total Bilirubin: 0.9 mg/dL (ref 0.2–1.2)
Total Protein: 7.1 g/dL (ref 6.0–8.3)

## 2014-03-02 LAB — T4, FREE: Free T4: 0.9 ng/dL (ref 0.60–1.60)

## 2014-03-02 LAB — TSH: TSH: 0.47 u[IU]/mL (ref 0.35–4.50)

## 2014-03-06 MED ORDER — SODIUM POLYSTYRENE SULFONATE PO POWD: Freq: Once | ORAL | Status: DC

## 2014-03-06 NOTE — Addendum Note (Signed)
Addended by: Modena Nunnery on: 03/06/2014 03:33 PM   Modules accepted: Orders

## 2014-03-07 ENCOUNTER — Other Ambulatory Visit (INDEPENDENT_AMBULATORY_CARE_PROVIDER_SITE_OTHER): Payer: Medicare Other

## 2014-03-07 DIAGNOSIS — E875 Hyperkalemia: Secondary | ICD-10-CM

## 2014-03-08 LAB — BASIC METABOLIC PANEL
BUN: 21 mg/dL (ref 6–23)
CO2: 25 mEq/L (ref 19–32)
Calcium: 8.6 mg/dL (ref 8.4–10.5)
Chloride: 107 mEq/L (ref 96–112)
Creatinine, Ser: 1.2 mg/dL (ref 0.4–1.2)
GFR: 46.49 mL/min — ABNORMAL LOW (ref >60.00)
Glucose, Bld: 89 mg/dL (ref 70–99)
Potassium: 4.3 mEq/L (ref 3.5–5.1)
Sodium: 140 mEq/L (ref 135–145)

## 2014-03-09 ENCOUNTER — Encounter: Payer: Self-pay | Admitting: Family Medicine

## 2014-03-09 ENCOUNTER — Telehealth: Payer: Self-pay | Admitting: Family Medicine

## 2014-03-09 ENCOUNTER — Ambulatory Visit (INDEPENDENT_AMBULATORY_CARE_PROVIDER_SITE_OTHER): Payer: Medicare Other | Admitting: Family Medicine

## 2014-03-09 VITALS — BP 124/66 | HR 57 | Temp 98.0°F | Ht 62.5 in | Wt 167.8 lb

## 2014-03-09 DIAGNOSIS — Z0000 Encounter for general adult medical examination without abnormal findings: Secondary | ICD-10-CM

## 2014-03-09 DIAGNOSIS — Z2911 Encounter for prophylactic immunotherapy for respiratory syncytial virus (RSV): Secondary | ICD-10-CM

## 2014-03-09 DIAGNOSIS — M810 Age-related osteoporosis without current pathological fracture: Secondary | ICD-10-CM

## 2014-03-09 DIAGNOSIS — N2581 Secondary hyperparathyroidism of renal origin: Secondary | ICD-10-CM

## 2014-03-09 DIAGNOSIS — N039 Chronic nephritic syndrome with unspecified morphologic changes: Secondary | ICD-10-CM

## 2014-03-09 DIAGNOSIS — Z23 Encounter for immunization: Secondary | ICD-10-CM

## 2014-03-09 DIAGNOSIS — N289 Disorder of kidney and ureter, unspecified: Secondary | ICD-10-CM

## 2014-03-09 DIAGNOSIS — E782 Mixed hyperlipidemia: Secondary | ICD-10-CM

## 2014-03-09 DIAGNOSIS — I10 Essential (primary) hypertension: Secondary | ICD-10-CM

## 2014-03-09 DIAGNOSIS — E875 Hyperkalemia: Secondary | ICD-10-CM

## 2014-03-09 DIAGNOSIS — R600 Localized edema: Secondary | ICD-10-CM

## 2014-03-09 DIAGNOSIS — E039 Hypothyroidism, unspecified: Secondary | ICD-10-CM

## 2014-03-09 DIAGNOSIS — Z780 Asymptomatic menopausal state: Secondary | ICD-10-CM

## 2014-03-09 DIAGNOSIS — Z860100 Personal history of colon polyps, unspecified: Secondary | ICD-10-CM

## 2014-03-09 DIAGNOSIS — H3561 Retinal hemorrhage, right eye: Secondary | ICD-10-CM

## 2014-03-09 DIAGNOSIS — D631 Anemia in chronic kidney disease: Secondary | ICD-10-CM

## 2014-03-09 DIAGNOSIS — R609 Edema, unspecified: Secondary | ICD-10-CM

## 2014-03-09 MED ORDER — ATORVASTATIN CALCIUM 20 MG PO TABS: ORAL_TABLET | ORAL | Status: DC

## 2014-03-09 MED ORDER — LEVOTHYROXINE SODIUM 100 MCG PO TABS: ORAL_TABLET | ORAL | Status: DC

## 2014-03-09 MED ORDER — ZOSTER VACCINE LIVE 19400 UNT/0.65ML ~~LOC~~ SOLR
0.6500 mL | Freq: Once | SUBCUTANEOUS | Status: DC
Start: 2014-03-09 — End: 2014-03-09

## 2014-03-09 MED ORDER — BENAZEPRIL HCL 10 MG PO TABS: ORAL_TABLET | ORAL | Status: DC

## 2014-03-09 NOTE — Assessment & Plan Note (Signed)
Followed by renal. S/p ergocalciferol.

## 2014-03-09 NOTE — Assessment & Plan Note (Addendum)
With hyperkalemia. Followed by renal. Cr stable.

## 2014-03-09 NOTE — Assessment & Plan Note (Addendum)
Left > right Encouraged to wear compression hose. Due to venous insufficiency. She is aware to elevate legs when possible. Lasix prn but with caution given kidney disease.

## 2014-03-09 NOTE — Assessment & Plan Note (Signed)
Well controlled on current rx. No changes. 

## 2014-03-09 NOTE — Assessment & Plan Note (Signed)
Stable on current dose of synthroid. No changes. 

## 2014-03-09 NOTE — Assessment & Plan Note (Signed)
Well controlled on current dose of lipitor. No changes.

## 2014-03-09 NOTE — Patient Instructions (Addendum)
Great to see you. We will call you with your bone density appointment. We are giving you Prevnar 13 (pneumonia booster) and Zostavax (shingles vaccine) today.

## 2014-03-09 NOTE — Telephone Encounter (Signed)
Relevant patient education assigned to patient using Emmi. ° °

## 2014-03-09 NOTE — Assessment & Plan Note (Signed)
On iron 

## 2014-03-09 NOTE — Assessment & Plan Note (Signed)
Likely due to HTN. Normotensive today. Last eye exam 05/2013- will advise her to make a follow up appt with optho.

## 2014-03-09 NOTE — Assessment & Plan Note (Signed)
Due to CKD III. S/p recent course of kayexalate. Will plan to recheck potassium in another 2 weeks.

## 2014-03-09 NOTE — Progress Notes (Signed)
Pre visit review using our clinic review tool, if applicable. No additional management support is needed unless otherwise documented below in the visit note. 

## 2014-03-09 NOTE — Assessment & Plan Note (Addendum)
The patients weight, height, BMI and visual acuity have been recorded in the chart I have made referrals, counseling and provided education to the patient based review of the above and I have provided the pt with a written personalized care plan for preventive services.  Zostavax Prevnar 13 DEXA

## 2014-03-09 NOTE — Progress Notes (Signed)
73 yo pleasant female here for medicare wellness visit. I have personally reviewed the Medicare Annual Wellness questionnaire and have noted 1. The patient's medical and social history 2. Their use of alcohol, tobacco or illicit drugs 3. Their current medications and supplements 4. The patient's functional ability including ADL's, fall risks, home safety risks and hearing or visual             impairment. 5. Diet and physical activities 6. Evidence for depression or mood disorders  End of life wishes discussed and updated in Social History. Pneumovax 07/17/2014 Mammogram 12/29/13 Colonoscopy 11/25/14Carlean Purl- recall in 5 years. Has never had the shingles vaccine- has had the shingles and would like to have it today.  She was told years ago she could not have it because she had an allergic reaction to fosamax.  Last DEXA several years ago- she is agreeable to having another one done.  She takes calcium. Saw Dr. Milinda Pointer in 05/2013.   Does not exercise because she cares and lives with her elderly mom. She is upset that she continues to weight but knows it is due to inactivity and not making the right food choices because her mom will not eat unless she eats what she eats- which is heavy food and she is not always hungry.  Wt Readings from Last 3 Encounters:  03/09/14 167 lb 12 oz (76.091 kg)  08/30/13 162 lb (73.483 kg)  08/18/13 162 lb 6.4 oz (73.664 kg)     Flame hemorrhage right eye- diagnosed during her dilated eye exam with Dr. Jeneen Rinks on 05/26/13- note reviewed. Had recommended follow up in 1 month which she did keep and per pt, it cleared up- we don't have those records.  CKD III- with h/o hyperkalemia, followed by Dr. Candiss Norse- was last seen on 12/18/13- note reviewed.  Secondary hyperparathyroidism- s/o course of ergocalciferol.  Followed by renal.  Anemia of chronic disease- on iron.  Hyperkalemia- s/p one dose of Kayelexate. Lab Results  Component Value Date   NA 140 03/07/2014    K 4.3 03/07/2014   CL 107 03/07/2014   CO2 25 03/07/2014   Cholesterol- stable on Lipitor 20 mg daily.   Denies any side effects. Due for labs.  Lab Results  Component Value Date   CHOL 180 03/02/2014   HDL 54.00 03/02/2014   LDLCALC 94 03/02/2014   TRIG 162.0* 03/02/2014   CHOLHDL 3 03/02/2014    Hypothyroidism- denies any symptoms of hypo or hyper thyroidism.   On synthroid 100 mcg daily.  Lab Results  Component Value Date   TSH 0.47 03/02/2014   HTN- stable on Benazapril 10 mg daily. Denies any CP, SOB or blurred vision.  Patient Active Problem List   Diagnosis Date Noted  . Hyperkalemia 03/09/2014  . Edema, lower extremity 06/16/2013  . Unspecified deficiency anemia 05/18/2013  . Renal insufficiency 03/31/2013  . ALLERGIC RHINITIS CAUSE UNSPECIFIED 11/13/2010  . POSTMENOPAUSAL STATUS 11/03/2008  . HYPERTENSION 09/15/2007  . OSTEOPOROSIS 09/15/2007  . Personal history of colonic adenomas 07/19/2007  . ANEMIA NOS 05/26/2007  . HYPOTHYROIDISM 05/17/2007  . DEPRESSION 05/17/2007  . 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 Colon polyps 06-15-07  . Hypertension   . Osteoporosis   . Allergy   . Thyroid disease   . Personal history of colonic adenomas 07/19/2007  . Renal insufficiency    Past Surgical History  Procedure Laterality Date  . Partial hysterectomy  age 89  . Appendectomy    .  Vaginal hysterectomy  age 54    partial  . Cesarean section      x 2   History  Substance Use Topics  . Smoking status: Former Smoker    Quit date: 10/07/2001  . Smokeless tobacco: Never Used  . Alcohol Use: Yes     Comment: rare   Family History  Problem Relation Age of Onset  . Coronary artery disease Mother   . Other Mother     bypass surgery  . Osteoarthritis Father   . Hypertension Brother   . Heart attack Maternal Grandfather   . Lung disease Brother   . Colon cancer Neg Hx   . Esophageal cancer Neg Hx   . Rectal cancer Neg Hx   .  Stomach cancer Neg Hx    Allergies  Allergen Reactions  . Codeine Phosphate     REACTION: Nausea/vomiting   Current Outpatient Prescriptions on File Prior to Visit  Medication Sig Dispense Refill  . ALPRAZolam (XANAX) 0.25 MG tablet TAKE 1 TABLET BY MOUTH EVERY NIGHT AT BEDTIME AS NEEDED.  30 tablet  0  . aspirin (ADULT ASPIRIN EC LOW STRENGTH) 81 MG EC tablet Take 81 mg by mouth daily.       Marland Kitchen atorvastatin (LIPITOR) 20 MG tablet TAKE 1 TABLET BY MOUTH DAILY  30 tablet  1  . benazepril (LOTENSIN) 10 MG tablet TAKE 1 TABLET BY MOUTH DAILY  30 tablet  1  . Calcium Citrate-Vitamin D (CITRACAL/VITAMIN D) 250-200 MG-UNIT TABS Take by mouth.        Lyndle Herrlich SULFATE PO Take 1 tablet by mouth daily. 5 grams.  Take one tablet at bedtime with orange juice      . Glucosamine HCl (GLUCOSAMINE MAXIMUM STRENGTH PO) Take 1 tablet by mouth 2 (two) times daily. Take one by mouth twice a day      . levothyroxine (SYNTHROID, LEVOTHROID) 100 MCG tablet TAKE 1 TABLET BY MOUTH DAILY  30 tablet  3  . multivitamin (THERAGRAN) per tablet Take 1 tablet by mouth daily.        . Omega-3 Fatty Acids (FISH OIL) 1000 MG CAPS Take by mouth. Take two daily       . sodium polystyrene (KAYEXALATE) powder Take by mouth once.  15 g  0  . valACYclovir (VALTREX) 1000 MG tablet Take 1,000 mg by mouth 2 (two) times daily as needed. For cold sores      . Vitamin D, Ergocalciferol, (DRISDOL) 50000 UNITS CAPS capsule Take 50,000 Units by mouth every 30 (thirty) days. Started in August, is to take for 6 months       No current facility-administered medications on file prior to visit.   The PMH, PSH, Social History, Family History, Medications, and allergies have been reviewed in [facility], and have been updated if relevant.  ROS: See HPI Patient reports no  vision/ hearing changes,anorexia, weight change, fever ,adenopathy, persistant / recurrent hoarseness, swallowing issues, chest pain, edema,persistant / recurrent cough, hemoptysis,  dyspnea(rest, exertional, paroxysmal nocturnal), gastrointestinal  bleeding (melena, rectal bleeding), abdominal pain, excessive heart burn, GU symptoms(dysuria, hematuria, pyuria, voiding/incontinence  Issues) syncope, focal weakness, severe memory loss, concerning skin lesions, depression, anxiety, abnormal bruising/bleeding, major joint swelling, breast masses or abnormal vaginal bleeding.    Physical exam: There were no vitals taken for this visit.  Wt Readings from Last 3 Encounters:  08/30/13 162 lb (73.483 kg)  08/18/13 162 lb 6.4 oz (73.664 kg)  06/16/13 159 lb (72.122 kg)  General:  Well-developed,well-nourished,in no acute distress; alert,appropriate and cooperative throughout examination Head:  normocephalic and atraumatic.   Eyes:  vision grossly intact, pupils equal, pupils round, and pupils reactive to light.   Ears:  R ear normal and L ear normal.   Nose:  no external deformity.   Mouth:  good dentition.   Lungs:  Normal respiratory effort, chest expands symmetrically. Lungs are clear to auscultation, no crackles or wheezes. Heart:  Normal rate and regular rhythm. S1 and S2 normal without gallop, murmur, click, rub or other extra sounds. Abdomen:  Bowel sounds positive,abdomen soft and non-tender without masses, organomegaly or hernias noted. Msk:  No deformity or scoliosis noted of thoracic or lumbar spine.   Extremities:   1+ right LE edema, no edema left Neurologic:  alert & oriented X3 and gait normal.   Skin:  Intact without suspicious lesions or rashes Psych:  Cognition and judgment appear intact. Alert and cooperative with normal attention span and concentration. No apparent delusions, illusions, hallucinations

## 2014-03-17 ENCOUNTER — Other Ambulatory Visit: Payer: Self-pay | Admitting: Family Medicine

## 2014-03-17 DIAGNOSIS — E875 Hyperkalemia: Secondary | ICD-10-CM

## 2014-03-23 ENCOUNTER — Encounter: Payer: Self-pay | Admitting: *Deleted

## 2014-03-23 ENCOUNTER — Other Ambulatory Visit (INDEPENDENT_AMBULATORY_CARE_PROVIDER_SITE_OTHER): Payer: Medicare Other

## 2014-03-23 DIAGNOSIS — E875 Hyperkalemia: Secondary | ICD-10-CM

## 2014-03-23 LAB — POTASSIUM: Potassium: 4.9 mEq/L (ref 3.5–5.1)

## 2014-03-23 NOTE — Addendum Note (Signed)
Addended by: Marchia Bond on: 03/23/2014 08:27 AM   Modules accepted: Orders

## 2014-03-29 ENCOUNTER — Telehealth: Payer: Self-pay

## 2014-03-29 NOTE — Telephone Encounter (Signed)
Spoke to pt and informed her of results.  

## 2014-03-29 NOTE — Telephone Encounter (Signed)
Pt left v/m requesting results of K taken on 03/23/14; pt went on my chart but could not find result note. Pt request cb.

## 2014-03-29 NOTE — Telephone Encounter (Signed)
It was normal.  Please apologize to her--I was out of the office.

## 2014-04-10 ENCOUNTER — Encounter: Payer: Self-pay | Admitting: Family Medicine

## 2014-04-10 ENCOUNTER — Other Ambulatory Visit: Payer: Self-pay | Admitting: Family Medicine

## 2014-04-10 NOTE — Telephone Encounter (Signed)
Pt requesting medication refill. Last f/u appt 03/2014-CPE with no future appts scheduled. pls advise

## 2014-04-10 NOTE — Telephone Encounter (Signed)
Spoke to pt and informed her Rx has been faxed to requested pharmacy 

## 2014-04-11 ENCOUNTER — Encounter: Payer: Self-pay | Admitting: Family Medicine

## 2014-04-12 ENCOUNTER — Encounter: Payer: Self-pay | Admitting: Family Medicine

## 2014-05-16 ENCOUNTER — Encounter: Payer: Self-pay | Admitting: Internal Medicine

## 2014-06-05 ENCOUNTER — Other Ambulatory Visit: Payer: Self-pay | Admitting: Family Medicine

## 2014-06-19 ENCOUNTER — Encounter: Payer: Self-pay | Admitting: Family Medicine

## 2014-07-12 ENCOUNTER — Telehealth: Payer: Self-pay

## 2014-07-12 NOTE — Telephone Encounter (Addendum)
Please call her kidney specialist, [facility], to get her most recent office note and labs.  She needs to talk with them about adjusting her medication for blood pressure--this is one of the things that kidney doctors help [place] with.  Benazapril can help with kidney function but yes, sometimes nephrologists do stop it if they feel it is not helping or worsening the problem.

## 2014-07-12 NOTE — Telephone Encounter (Signed)
Pt left v/m pt kidney function is decreasing; kidney function has gone from 47% to 33% in 6 months. Pt wants to know if Dr Deborra Medina can contact Kidney specialist to get lab results for kidney function. Pt wants med to bring Potassium down. Pt has spoken with [facility] and pt wants to know if benazepril could be causing problems. Pt would like to get different BP med sent to [facility] and pt request cb.

## 2014-07-13 NOTE — Telephone Encounter (Signed)
Lm on pts vm requesting a call back 

## 2014-07-13 NOTE — Telephone Encounter (Signed)
Carla Lyons pt sister left v/m requesting cb at [phone]. Pt is out of town.

## 2014-07-13 NOTE — Telephone Encounter (Signed)
Sister is not on pt's DPR.  ?

## 2014-07-14 NOTE — Telephone Encounter (Signed)
Spoke to pt and advised per Dr Deborra Medina. Pt verbally expressed understanding and states that she will contact nephrology and sched f/u to discuss meds.

## 2014-07-15 ENCOUNTER — Other Ambulatory Visit: Payer: Self-pay | Admitting: Family Medicine

## 2014-07-16 ENCOUNTER — Other Ambulatory Visit: Payer: Self-pay | Admitting: Family Medicine

## 2014-07-18 NOTE — Telephone Encounter (Signed)
Pt requesting medication refill. Last f/u appt 03/2014. Ok to fill per Dr Aron. Rx to be faxed to requested pharmacy before end of day, today.  

## 2014-07-25 ENCOUNTER — Encounter: Payer: Self-pay | Admitting: Family Medicine

## 2014-09-15 ENCOUNTER — Other Ambulatory Visit: Payer: Self-pay | Admitting: Family Medicine

## 2014-10-11 ENCOUNTER — Telehealth: Payer: Self-pay | Admitting: Family Medicine

## 2014-10-11 NOTE — Telephone Encounter (Signed)
Pt hasn't been in for an appmt or labs in 6 months and is wondering if she needs to come in for labs since she is on cholesterol and thyroid meds. She says nothing is wrong or bothering her. Pt requests a c/b. Thank you.

## 2014-10-11 NOTE — Telephone Encounter (Signed)
Spoke to pt and scheduled f/u visit. Last ov 03/2014

## 2014-10-11 NOTE — Telephone Encounter (Signed)
Yes I would suggest that she does.

## 2014-10-17 ENCOUNTER — Other Ambulatory Visit: Payer: Self-pay | Admitting: Family Medicine

## 2014-10-18 NOTE — Telephone Encounter (Signed)
Pt requesting medication refill. Pt has upcoming 01/19 appt. pls advise

## 2014-10-19 NOTE — Telephone Encounter (Signed)
Rx called in to requested pharmacy 

## 2014-10-24 ENCOUNTER — Ambulatory Visit (INDEPENDENT_AMBULATORY_CARE_PROVIDER_SITE_OTHER): Payer: Medicare Other | Admitting: Family Medicine

## 2014-10-24 ENCOUNTER — Encounter: Payer: Self-pay | Admitting: Family Medicine

## 2014-10-24 VITALS — BP 130/82 | HR 62 | Temp 98.0°F | Wt 161.2 lb

## 2014-10-24 DIAGNOSIS — N189 Chronic kidney disease, unspecified: Secondary | ICD-10-CM

## 2014-10-24 DIAGNOSIS — M25561 Pain in right knee: Secondary | ICD-10-CM

## 2014-10-24 DIAGNOSIS — N183 Chronic kidney disease, stage 3 unspecified: Secondary | ICD-10-CM

## 2014-10-24 DIAGNOSIS — I10 Essential (primary) hypertension: Secondary | ICD-10-CM

## 2014-10-24 DIAGNOSIS — E038 Other specified hypothyroidism: Secondary | ICD-10-CM

## 2014-10-24 DIAGNOSIS — D631 Anemia in chronic kidney disease: Secondary | ICD-10-CM

## 2014-10-24 DIAGNOSIS — E782 Mixed hyperlipidemia: Secondary | ICD-10-CM

## 2014-10-24 LAB — COMPREHENSIVE METABOLIC PANEL
ALT: 36 U/L — ABNORMAL HIGH (ref 0–35)
AST: 28 U/L (ref 0–37)
Albumin: 4.2 g/dL (ref 3.5–5.2)
Alkaline Phosphatase: 51 U/L (ref 39–117)
BUN: 32 mg/dL — ABNORMAL HIGH (ref 6–23)
CO2: 29 mEq/L (ref 19–32)
Calcium: 9.8 mg/dL (ref 8.4–10.5)
Chloride: 105 mEq/L (ref 96–112)
Creatinine, Ser: 1.33 mg/dL — ABNORMAL HIGH (ref 0.40–1.20)
GFR: 41.61 mL/min — ABNORMAL LOW (ref >60.00)
Glucose, Bld: 98 mg/dL (ref 70–99)
Potassium: 5.7 mEq/L — ABNORMAL HIGH (ref 3.5–5.1)
Sodium: 138 mEq/L (ref 135–145)
Total Bilirubin: 0.7 mg/dL (ref 0.2–1.2)
Total Protein: 7.1 g/dL (ref 6.0–8.3)

## 2014-10-24 LAB — CBC WITH DIFFERENTIAL/PLATELET
Basophils Absolute: 0 10*3/uL (ref 0.0–0.1)
Basophils Relative: 0.5 % (ref 0.0–3.0)
Eosinophils Absolute: 0.2 10*3/uL (ref 0.0–0.7)
Eosinophils Relative: 3.7 % (ref 0.0–5.0)
HCT: 35.1 % — ABNORMAL LOW (ref 36.0–46.0)
Hemoglobin: 11.6 g/dL — ABNORMAL LOW (ref 12.0–15.0)
Lymphocytes Relative: 22.1 % (ref 12.0–46.0)
Lymphs Abs: 1.3 10*3/uL (ref 0.7–4.0)
MCHC: 33.2 g/dL (ref 30.0–36.0)
MCV: 98.4 fl (ref 78.0–100.0)
Monocytes Absolute: 0.6 10*3/uL (ref 0.1–1.0)
Monocytes Relative: 10.5 % (ref 3.0–12.0)
Neutro Abs: 3.9 10*3/uL (ref 1.4–7.7)
Neutrophils Relative %: 63.2 % (ref 43.0–77.0)
Platelets: 207 10*3/uL (ref 150.0–400.0)
RBC: 3.57 Mil/uL — ABNORMAL LOW (ref 3.87–5.11)
RDW: 12.7 % (ref 11.5–15.5)
WBC: 6.1 10*3/uL (ref 4.0–10.5)

## 2014-10-24 LAB — LIPID PANEL
Cholesterol: 168 mg/dL (ref 0–200)
HDL: 55.6 mg/dL (ref >39.00)
LDL Cholesterol: 87 mg/dL (ref 0–99)
NonHDL: 112.4
Total CHOL/HDL Ratio: 3
Triglycerides: 129 mg/dL (ref 0.0–149.0)
VLDL: 25.8 mg/dL (ref 0.0–40.0)

## 2014-10-24 LAB — TSH: TSH: 0.94 u[IU]/mL (ref 0.35–4.50)

## 2014-10-24 LAB — T4, FREE: Free T4: 1.07 ng/dL (ref 0.60–1.60)

## 2014-10-24 MED ORDER — ATORVASTATIN CALCIUM 20 MG PO TABS: ORAL_TABLET | ORAL | Status: DC

## 2014-10-24 MED ORDER — BENAZEPRIL HCL 10 MG PO TABS: 10.0000 mg | ORAL_TABLET | Freq: Every day | ORAL | Status: DC

## 2014-10-24 NOTE — Assessment & Plan Note (Signed)
Clinically euthyroid. Check labs today.

## 2014-10-24 NOTE — Progress Notes (Signed)
Subjective:   Patient ID: Carla Lyons, female    DOB: 04-Dec-1940, 74 y.o.   MRN: [id number]  Carla Lyons is a pleasant 74 y.o. year old female who presents to clinic today with Follow-up and Knee Pain  on 10/24/2014  HPI:  Right knee pain- h/o OA, feels right knee hurts most on and off- worsened by changes in weather.  No radiculopathy, only mild swelling.  No feeling like knee is giving out on her.  Tylenol is helping.  CKD III- with h/o hyperkalemia, followed by Dr. Abigail Butts was last seen on 10/02/14- note reviewed.  Secondary hyperparathyroidism- s/o course of ergocalciferol.  Followed by renal.  Anemia of chronic disease- on iron.  Lab Results  Component Value Date   WBC 4.9 03/02/2014   HGB 12.1 03/02/2014   HCT 36.0 03/02/2014   MCV 98.9 03/02/2014   PLT 183.0 03/02/2014     Cholesterol- stable on Lipitor 20 mg daily.   Denies any side effects. Due for labs.   Lab Results  Component Value Date   CHOL 180 03/02/2014   HDL 54.00 03/02/2014   LDLCALC 94 03/02/2014   TRIG 162.0* 03/02/2014   CHOLHDL 3 03/02/2014    Hypothyroidism- denies any symptoms of hypo or hyper thyroidism.   On synthroid 100 mcg daily.  Lab Results  Component Value Date   TSH 0.47 03/02/2014    HTN- stable on Benazapril 10 mg daily. Denies any CP, SOB or blurred vision.  Lab Results  Component Value Date   CREATININE 1.2 03/07/2014   Current Outpatient Prescriptions on File Prior to Visit  Medication Sig Dispense Refill  . ALPRAZolam (XANAX) 0.25 MG tablet TAKE 1 TABLET BY MOUTH EVERY NIGHT AT BEDTIME AS NEEDED. 30 tablet 0  . aspirin (ADULT ASPIRIN EC LOW STRENGTH) 81 MG EC tablet Take 81 mg by mouth daily.     . Calcium Citrate-Vitamin D (CITRACAL/VITAMIN D) 250-200 MG-UNIT TABS Take by mouth.      Carla Lyons SULFATE PO Take 1 tablet by mouth daily. 5 grams.  Take one tablet at bedtime with orange juice    . Glucosamine HCl (GLUCOSAMINE MAXIMUM STRENGTH PO) Take 1 tablet by mouth 2  (two) times daily. Take one by mouth twice a day    . levothyroxine (SYNTHROID, LEVOTHROID) 100 MCG tablet TAKE 1 TABLET BY MOUTH DAILY 30 tablet 4  . multivitamin (THERAGRAN) per tablet Take 1 tablet by mouth daily.      . Omega-3 Fatty Acids (FISH OIL) 1000 MG CAPS Take by mouth. Take two daily     . valACYclovir (VALTREX) 1000 MG tablet Take 1,000 mg by mouth 2 (two) times daily as needed. For cold sores    . valACYclovir (VALTREX) 1000 MG tablet TAKE 1 TABLET BY MOUTH DAILY 30 tablet 1  . Vitamin D, Ergocalciferol, (DRISDOL) 50000 UNITS CAPS capsule Take 50,000 Units by mouth every 30 (thirty) days. Started in August, is to take for 6 months     No current facility-administered medications on file prior to visit.    Allergies  Allergen Reactions  . Codeine Phosphate     REACTION: Nausea/vomiting  . Fosamax [Alendronate Sodium]     Red and hot all over    Past Medical History  Diagnosis Date  . Colon polyps 06-15-07  . Hypertension   . Osteoporosis   . Allergy   . Thyroid disease   . Personal history of colonic adenomas 07/19/2007  . Renal insufficiency  Past Surgical History  Procedure Laterality Date  . Partial hysterectomy  age 74  . Appendectomy    . Vaginal hysterectomy  age 74    partial  . Cesarean section      x 2    Family History  Problem Relation Age of Onset  . Coronary artery disease Mother   . Other Mother     bypass surgery  . Osteoarthritis Father   . Hypertension Brother   . Heart attack Maternal Grandfather   . Lung disease Brother   . Colon cancer Neg Hx   . Esophageal cancer Neg Hx   . Rectal cancer Neg Hx   . Stomach cancer Neg Hx     History   Social History  . Marital Status: Widowed    Spouse Name: N/A    Number of Children: 2  . Years of Education: N/A   Occupational History  . Retired     Social History Main Topics  . Smoking status: Former Smoker    Quit date: 10/07/2001  . Smokeless tobacco: Never Used  . Alcohol  Use: Yes     Comment: rare  . Drug Use: No  . Sexual Activity: Not on file   Other Topics Concern  . Not on file   Social History Narrative   Does have a living will.   Daughter is HPOA- does not want any extra ordinary measures.  Would want CPR.   The PMH, PSH, Social History, Family History, Medications, and allergies have been reviewed in [facility], and have been updated if relevant.  Review of Systems  Constitutional: Negative.   HENT: Negative.   Respiratory: Negative.   Cardiovascular: Negative.   Gastrointestinal: Negative.   Endocrine: Negative.   Genitourinary: Negative.   Musculoskeletal: Positive for myalgias.  Skin: Negative.   Allergic/Immunologic: Negative.   Neurological: Negative.   Hematological: Negative.   Psychiatric/Behavioral: Negative.   All other systems reviewed and are negative.      Objective:    BP 130/82 mmHg  Pulse 62  Temp(Src) 98 F (36.7 C) (Oral)  Wt 161 lb 4 oz (73.143 kg)  SpO2 97%   Physical Exam  Constitutional: She is oriented to person, place, and time. She appears well-developed and well-nourished. No distress.  HENT:  Head: Normocephalic.  Eyes: Conjunctivae are normal.  Cardiovascular: Normal rate.   Pulmonary/Chest: Effort normal.  Musculoskeletal:       Right knee: She exhibits normal range of motion, no swelling, no effusion, no ecchymosis, no deformity, no laceration, no erythema, normal alignment, no LCL laxity and normal patellar mobility.  Neurological: She is alert and oriented to person, place, and time.  Skin: Skin is warm and dry.  Psychiatric: She has a normal mood and affect. Her behavior is normal. Judgment and thought content normal.  Nursing note and vitals reviewed.         Assessment & Plan:   Right knee pain  CKD (chronic kidney disease), stage III  Other specified hypothyroidism - Plan: TSH, T4, Free  Essential hypertension  HYPERLIPIDEMIA, MIXED - Plan: Lipid panel, Comprehensive metabolic  panel  Anemia in chronic renal disease - Plan: CBC with Differential No Follow-up on file.

## 2014-10-24 NOTE — Patient Instructions (Addendum)
Good to see you. Please use as need Tylenol ONLY for your knee.  Keep updated. We will let you know how your blood work looks.

## 2014-10-24 NOTE — Progress Notes (Signed)
Pre visit review using our clinic review tool, if applicable. No additional management support is needed unless otherwise documented below in the visit note. 

## 2014-10-24 NOTE — Assessment & Plan Note (Signed)
Well controlled.  No changes made. 

## 2014-10-24 NOTE — Assessment & Plan Note (Signed)
Reheck labs today. Orders Placed This Encounter  Procedures  . TSH  . T4, Free  . Lipid panel  . CBC with Differential  . Comprehensive metabolic panel

## 2014-10-24 NOTE — Assessment & Plan Note (Signed)
Probable OA. Advised tylenol ONLY- no NSAIDS given CKD. Call or return to clinic prn if these symptoms worsen or fail to improve as anticipated. The patient indicates understanding of these issues and agrees with the plan.

## 2014-10-24 NOTE — Assessment & Plan Note (Signed)
Followed by renal. °

## 2014-10-25 ENCOUNTER — Other Ambulatory Visit: Payer: Self-pay | Admitting: Family Medicine

## 2014-10-25 MED ORDER — KAYEXALATE PO POWD: 15.0000 g | Freq: Once | ORAL | Status: DC

## 2014-11-02 ENCOUNTER — Telehealth: Payer: Self-pay | Admitting: Family Medicine

## 2014-11-02 NOTE — Telephone Encounter (Signed)
Pt called stating her sister is picking her rx up this morning.  She has not ate anything today she wants to know when she can eat after taking meds.  She also wants to know when she needs to come back to the office to get lab work.  She stated she could do  today if she needs to.  She thinks she needs to come get her labs done within hours after taking her med.   Please advise pt ASAP!!  [phone]

## 2014-11-02 NOTE — Telephone Encounter (Signed)
Spoke to pt and advised per Dr Deborra Medina. Pt is to take meds as soon as she receives them and have repeat labs on 11/03/14. Pt verbally expressed understanding. See result note

## 2014-11-03 ENCOUNTER — Other Ambulatory Visit (INDEPENDENT_AMBULATORY_CARE_PROVIDER_SITE_OTHER): Payer: Medicare Other

## 2014-11-03 DIAGNOSIS — E875 Hyperkalemia: Secondary | ICD-10-CM

## 2014-11-03 LAB — BASIC METABOLIC PANEL
BUN: 25 mg/dL — ABNORMAL HIGH (ref 6–23)
CO2: 27 mEq/L (ref 19–32)
Calcium: 9.4 mg/dL (ref 8.4–10.5)
Chloride: 104 mEq/L (ref 96–112)
Creatinine, Ser: 1.21 mg/dL — ABNORMAL HIGH (ref 0.40–1.20)
GFR: 46.41 mL/min — ABNORMAL LOW (ref >60.00)
Glucose, Bld: 97 mg/dL (ref 70–99)
Potassium: 4.3 mEq/L (ref 3.5–5.1)
Sodium: 137 mEq/L (ref 135–145)

## 2014-11-06 ENCOUNTER — Encounter: Payer: Self-pay | Admitting: *Deleted

## 2014-11-20 ENCOUNTER — Other Ambulatory Visit: Payer: Self-pay | Admitting: Family Medicine

## 2014-12-18 ENCOUNTER — Other Ambulatory Visit: Payer: Self-pay | Admitting: Family Medicine

## 2014-12-18 ENCOUNTER — Encounter (HOSPITAL_COMMUNITY): Payer: Self-pay | Admitting: General Practice

## 2014-12-18 ENCOUNTER — Emergency Department (HOSPITAL_COMMUNITY)
Admission: EM | Admit: 2014-12-18 | Discharge: 2014-12-18 | Disposition: A | Discharge status: Home / Self Care | Payer: Medicare Other | Attending: Emergency Medicine | Admitting: Emergency Medicine

## 2014-12-18 ENCOUNTER — Emergency Department (HOSPITAL_COMMUNITY): Payer: Medicare Other

## 2014-12-18 DIAGNOSIS — Z87448 Personal history of other diseases of urinary system: Secondary | ICD-10-CM | POA: Insufficient documentation

## 2014-12-18 DIAGNOSIS — E079 Disorder of thyroid, unspecified: Secondary | ICD-10-CM | POA: Insufficient documentation

## 2014-12-18 DIAGNOSIS — Z79899 Other long term (current) drug therapy: Secondary | ICD-10-CM | POA: Diagnosis not present

## 2014-12-18 DIAGNOSIS — Z87891 Personal history of nicotine dependence: Secondary | ICD-10-CM | POA: Insufficient documentation

## 2014-12-18 DIAGNOSIS — Z8739 Personal history of other diseases of the musculoskeletal system and connective tissue: Secondary | ICD-10-CM | POA: Insufficient documentation

## 2014-12-18 DIAGNOSIS — I10 Essential (primary) hypertension: Secondary | ICD-10-CM | POA: Insufficient documentation

## 2014-12-18 DIAGNOSIS — Z86010 Personal history of colonic polyps: Secondary | ICD-10-CM | POA: Insufficient documentation

## 2014-12-18 DIAGNOSIS — G500 Trigeminal neuralgia: Secondary | ICD-10-CM | POA: Diagnosis not present

## 2014-12-18 DIAGNOSIS — R51 Headache: Secondary | ICD-10-CM | POA: Diagnosis present

## 2014-12-18 DIAGNOSIS — R200 Anesthesia of skin: Secondary | ICD-10-CM | POA: Insufficient documentation

## 2014-12-18 LAB — CBC WITH DIFFERENTIAL/PLATELET
Basophils Absolute: 0.1 10*3/uL (ref 0.0–0.1)
Basophils Relative: 1 % (ref 0–1)
Eosinophils Absolute: 0.1 10*3/uL (ref 0.0–0.7)
Eosinophils Relative: 2 % (ref 0–5)
HCT: 34 % — ABNORMAL LOW (ref 36.0–46.0)
Hemoglobin: 11 g/dL — ABNORMAL LOW (ref 12.0–15.0)
Lymphocytes Relative: 18 % (ref 12–46)
Lymphs Abs: 1.2 10*3/uL (ref 0.7–4.0)
MCH: 32.7 pg (ref 26.0–34.0)
MCHC: 32.4 g/dL (ref 30.0–36.0)
MCV: 101.2 fL — ABNORMAL HIGH (ref 78.0–100.0)
Monocytes Absolute: 0.5 10*3/uL (ref 0.1–1.0)
Monocytes Relative: 7 % (ref 3–12)
Neutro Abs: 4.8 10*3/uL (ref 1.7–7.7)
Neutrophils Relative %: 72 % (ref 43–77)
Platelets: 191 10*3/uL (ref 150–400)
RBC: 3.36 MIL/uL — ABNORMAL LOW (ref 3.87–5.11)
RDW: 12.4 % (ref 11.5–15.5)
WBC: 6.7 10*3/uL (ref 4.0–10.5)

## 2014-12-18 LAB — C-REACTIVE PROTEIN: CRP: <0.5 mg/dL — ABNORMAL LOW (ref <0.60)

## 2014-12-18 LAB — COMPREHENSIVE METABOLIC PANEL
ALT: 22 U/L (ref 0–35)
AST: 22 U/L (ref 0–37)
Albumin: 3.4 g/dL — ABNORMAL LOW (ref 3.5–5.2)
Alkaline Phosphatase: 41 U/L (ref 39–117)
Anion gap: 8 (ref 5–15)
BUN: 24 mg/dL — ABNORMAL HIGH (ref 6–23)
CO2: 25 mmol/L (ref 19–32)
Calcium: 9.5 mg/dL (ref 8.4–10.5)
Chloride: 107 mmol/L (ref 96–112)
Creatinine, Ser: 1.14 mg/dL — ABNORMAL HIGH (ref 0.50–1.10)
GFR calc Af Amer: 54 mL/min — ABNORMAL LOW (ref >90)
GFR calc non Af Amer: 47 mL/min — ABNORMAL LOW (ref >90)
Glucose, Bld: 100 mg/dL — ABNORMAL HIGH (ref 70–99)
Potassium: 5.3 mmol/L — ABNORMAL HIGH (ref 3.5–5.1)
Sodium: 140 mmol/L (ref 135–145)
Total Bilirubin: 0.7 mg/dL (ref 0.3–1.2)
Total Protein: 6.6 g/dL (ref 6.0–8.3)

## 2014-12-18 LAB — PROTIME-INR
INR: 1.07 (ref 0.00–1.49)
Prothrombin Time: 14 seconds (ref 11.6–15.2)

## 2014-12-18 LAB — URINALYSIS, ROUTINE W REFLEX MICROSCOPIC
Bilirubin Urine: NEGATIVE
Glucose, UA: NEGATIVE mg/dL
Hgb urine dipstick: NEGATIVE
Ketones, ur: NEGATIVE mg/dL
Leukocytes, UA: NEGATIVE
Nitrite: NEGATIVE
Protein, ur: NEGATIVE mg/dL
Specific Gravity, Urine: 1.006 (ref 1.005–1.030)
Urobilinogen, UA: 0.2 mg/dL (ref 0.0–1.0)
pH: 5.5 (ref 5.0–8.0)

## 2014-12-18 LAB — TROPONIN I: Troponin I: <0.03 ng/mL (ref <0.031)

## 2014-12-18 LAB — SEDIMENTATION RATE: Sed Rate: 25 mm/hr — ABNORMAL HIGH (ref 0–22)

## 2014-12-18 MED ORDER — SODIUM CHLORIDE 0.9 % IV BOLUS (SEPSIS)
1000.0000 mL | Freq: Once | INTRAVENOUS | Status: DC
Start: 2014-12-18 — End: 2014-12-18

## 2014-12-18 MED ORDER — CARBAMAZEPINE 200 MG PO TABS
200.0000 mg | ORAL_TABLET | Freq: Once | ORAL | Status: AC
  Administered 2014-12-18: 200 mg via ORAL
  Filled 2014-12-18: qty 1

## 2014-12-18 MED ORDER — ACETAMINOPHEN 325 MG PO TABS
650.0000 mg | ORAL_TABLET | Freq: Once | ORAL | Status: AC
  Administered 2014-12-18: 650 mg via ORAL
  Filled 2014-12-18: qty 2

## 2014-12-18 MED ORDER — CARBAMAZEPINE 200 MG PO TABS: 200.0000 mg | ORAL_TABLET | Freq: Every day | ORAL | Status: DC

## 2014-12-18 MED ORDER — SODIUM CHLORIDE 0.9 % IV BOLUS (SEPSIS)
1000.0000 mL | Freq: Once | INTRAVENOUS | Status: AC
  Administered 2014-12-18: 1000 mL via INTRAVENOUS

## 2014-12-18 MED ORDER — ACETAMINOPHEN 500 MG PO TABS
1000.0000 mg | ORAL_TABLET | Freq: Once | ORAL | Status: AC
  Administered 2014-12-18: 1000 mg via ORAL
  Filled 2014-12-18: qty 2

## 2014-12-18 NOTE — Telephone Encounter (Signed)
Rx called in to requested pharmacy 

## 2014-12-18 NOTE — Discharge Instructions (Signed)
Please call your doctor for a followup appointment within 24-48 hours. When you talk to your doctor please let them know that you were seen in the emergency department and have them acquire all of your records so that they can discuss the findings with you and formulate a treatment plan to fully care for your new and ongoing problems. Please follow up with your primary care provider Please follow up with neurology as an outpatient Please rest and stay hydrated Please drink plenty of water  Please take medications as prescribed - please no driving, drinking alcohol with medication  Please continue to monitor symptoms closely and if symptoms are to worsen or change (fever greater than 101, chills, sweating, nausea, vomiting, chest pain, shortness of breathe, difficulty breathing, weakness, numbness, tingling, worsening or changes to pain pattern, loss of sensation, speech difficulty, blurred vision, sudden loss of vision) please report back to the Emergency Department immediately.    Trigeminal Neuralgia Trigeminal neuralgia is a nerve disorder that causes sudden attacks of severe facial pain. It is caused by damage to the trigeminal nerve, a major nerve in the face. It is more common in women and in the elderly, although it can also happen in younger patients. Attacks last from a few seconds to several minutes and can occur from a couple of times per year to several times per day. Trigeminal neuralgia can be a very distressing and disabling condition. Surgery may be needed in very severe cases if medical treatment does not give relief. HOME CARE INSTRUCTIONS   If your caregiver prescribed medication to help prevent attacks, take as directed.  To help prevent attacks:  Chew on the unaffected side of the mouth.  Avoid touching your face.  Avoid blasts of hot or cold air.  Men may wish to grow a beard to avoid having to shave. SEEK IMMEDIATE MEDICAL CARE IF:  Pain is unbearable and your  medicine does not help.  You develop new, unexplained symptoms (problems).  You have problems that may be related to a medication you are taking. Document Released: 09/19/2000 Document Revised: 12/15/2011 Document Reviewed: 07/20/2009 [facility] Patient Information 2015 [place], [place]. This information is not intended to replace advice given to you by your health care provider. Make sure you discuss any questions you have with your health care provider.

## 2014-12-18 NOTE — ED Notes (Signed)
Pt brought in via [facility] complaining of generalized facial numbness with an associated headache. Pt symptoms started during ambulation on a treadmill. Pt reports feelings anxious, and lightheaded during ambulation. Pt is A/O. Pt denies N/V. Pt reports displaying similar symptoms with a history hyperkalemia. EMS V/S 142/72, SPO2 94%, HR 60, RR 16, CBG 69.

## 2014-12-18 NOTE — ED Provider Notes (Signed)
CSN: [id number]     Arrival date & time 12/18/14  1116 History   First MD Initiated Contact with Patient 12/18/14 1117     Chief Complaint  Patient presents with  . Headache     (Consider location/radiation/quality/duration/timing/severity/associated sxs/prior Treatment) The history is provided by the patient. No language interpreter was used.  Carla Lyons is a 74 y/o F with PMHx of colonic polyps, HTN, osteoporosis, thyroid disease, renal insufficiency presenting to the ED with headache that started this morning while the patient was on a treadmill. Patient reported that she had sudden onset of sharp pain localized to the right side of the face that lasted approximately 30 seconds. Patient reported that her right side of her face started to feel warm and numb. Stated that she had another episode of sharp pain that lasted 30 seconds with associated warmness and numbness to the right side of the face. Reported that she continues to experience this numbness, but denied episodes of sharp pain. Patient reported that she has had episodes of numbness and warmness before that was associated with hyperkalemia. Stated that she did not take anything for her headache-reported that she took half of a tablet of Xanax that was 0.25 mg a little after 10 AM. Patient reported that she feels a mild pressure behind her eyes bilaterally. Denied weakness, falls, head injury, blurred vision, sudden loss of vision, difficulty swallowing, neck pain, neck stiffness, nausea, vomiting, diarrhea, abdominal pain, travels, chest pain, shortness of breath, difficulty breathing. PCP Dr. Deborra Medina   Past Medical History  Diagnosis Date  . Colon polyps 06-15-07  . Hypertension   . Osteoporosis   . Allergy   . Thyroid disease   . Personal history of colonic adenomas 07/19/2007  . Renal insufficiency    Past Surgical History  Procedure Laterality Date  . Partial hysterectomy  age 85  . Appendectomy    . Vaginal hysterectomy  age 13     partial  . Cesarean section      x 2   Family History  Problem Relation Age of Onset  . Coronary artery disease Mother   . Other Mother     bypass surgery  . Osteoarthritis Father   . Hypertension Brother   . Heart attack Maternal Grandfather   . Lung disease Brother   . Colon cancer Neg Hx   . Esophageal cancer Neg Hx   . Rectal cancer Neg Hx   . Stomach cancer Neg Hx    History  Substance Use Topics  . Smoking status: Former Smoker    Quit date: 10/07/2001  . Smokeless tobacco: Never Used  . Alcohol Use: Yes     Comment: rare   OB History    No data available     Review of Systems  Constitutional: Negative for fever and chills.  Eyes: Negative for visual disturbance.  Respiratory: Negative for chest tightness and shortness of breath.   Cardiovascular: Negative for chest pain.  Gastrointestinal: Negative for nausea, vomiting and abdominal pain.  Neurological: Positive for numbness and headaches. Negative for weakness.      Allergies  Codeine phosphate and Fosamax  Home Medications   Prior to Admission medications   Medication Sig Start Date End Date Taking? Authorizing Provider  ALPRAZolam (XANAX) 0.25 MG tablet TAKE 1 TABLET BY MOUTH EVERY NIGHT AT BEDTIME AS NEEDED. Patient taking differently: TAKE 1/2 - 1  TABLET BY MOUTH EVERY NIGHT AT BEDTIME AS NEEDED. 12/18/14  Yes Lucille Passy, MD  aspirin (ADULT ASPIRIN EC LOW STRENGTH) 81 MG EC tablet Take 81 mg by mouth daily. mon wd fri   Yes Historical Provider, MD  atorvastatin (LIPITOR) 20 MG tablet TAKE 1 TABLET BY MOUTH DAILY 10/24/14  Yes Lucille Passy, MD  benazepril (LOTENSIN) 10 MG tablet Take 1 tablet (10 mg total) by mouth daily. 10/24/14  Yes Lucille Passy, MD  FERROUS SULFATE PO Take 1 tablet by mouth daily. 5 grams.  Take one tablet at bedtime with orange juice   Yes Historical Provider, MD  levothyroxine (SYNTHROID, LEVOTHROID) 100 MCG tablet TAKE 1 TABLET BY MOUTH DAILY 09/15/14  Yes Lucille Passy, MD   multivitamin [facility]) per tablet Take 1 tablet by mouth daily.     Yes Historical Provider, MD  Omega-3 Fatty Acids (FISH OIL) 1000 MG CAPS Take by mouth. Take two daily    Yes Historical Provider, MD  atorvastatin (LIPITOR) 20 MG tablet Take 1 tablet (20 mg total) by mouth daily. 11/20/14   Lucille Passy, MD  benazepril (LOTENSIN) 10 MG tablet TAKE 1 TABLET BY MOUTH DAILY 11/20/14   Lucille Passy, MD  carbamazepine (TEGRETOL) 200 MG tablet Take 1 tablet (200 mg total) by mouth daily. 12/18/14   Raynie Steinhaus, PA-C  Glucosamine HCl (GLUCOSAMINE MAXIMUM STRENGTH PO) Take 1 tablet by mouth 2 (two) times daily. Take one by mouth twice a day    Historical Provider, MD  Sodium Polystyrene Sulfonate (KAYEXALATE) POWD Take 15 g by mouth once. Patient not taking: Reported on 12/18/2014 10/25/14   Lucille Passy, MD  valACYclovir (VALTREX) 1000 MG tablet TAKE 1 TABLET BY MOUTH DAILY 06/06/14   Lucille Passy, MD  Vitamin D, Ergocalciferol, (DRISDOL) 50000 UNITS CAPS capsule Take 50,000 Units by mouth every 30 (thirty) days. Started in August, is to take for 6 months    Historical Provider, MD   BP 146/54 mmHg  Pulse 53  Temp(Src) 98.5 F (36.9 C) (Oral)  Resp 17  Ht 5\' 3"  (1.6 m)  Wt 154 lb (69.854 kg)  BMI 27.29 kg/m2  SpO2 98% Physical Exam  Constitutional: She is oriented to person, place, and time. She appears well-developed and well-nourished. No distress.  HENT:  Head: Normocephalic and atraumatic.  Mouth/Throat: Oropharynx is clear and moist. No oropharyngeal exudate.  Eyes: Conjunctivae and EOM are normal. Pupils are equal, round, and reactive to light. Right eye exhibits no discharge. Left eye exhibits no discharge.  Neck: Normal range of motion. Neck supple. No tracheal deviation present.  Negative neck stiffness Negative nuchal rigidity  Negative cervical lymphadenopathy  Negative meningeal signs   Cardiovascular: Normal rate, regular rhythm and normal heart sounds.  Exam reveals no  friction rub.   No murmur heard. Pulmonary/Chest: Effort normal and breath sounds normal. No respiratory distress. She has no wheezes. She has no rales.  Musculoskeletal: Normal range of motion.  Lymphadenopathy:    She has no cervical adenopathy.  Neurological: She is alert and oriented to person, place, and time. No cranial nerve deficit. She exhibits normal muscle tone. Coordination normal. GCS eye subscore is 4. GCS verbal subscore is 5. GCS motor subscore is 6.  Cranial nerves III-XII grossly intact Strength 5+/5+ to upper and lower extremities bilaterally with resistance applied, equal distribution noted Equal grip strength bilaterally  Negative facial droop Negative slurred speech  Negative aphasia Negative arm drift Fine motor skills intact Patient follows commands well  Patient responds to questions appropriately   Skin: Skin is warm  and dry. No rash noted. She is not diaphoretic. No erythema.  Psychiatric: She has a normal mood and affect. Her behavior is normal. Thought content normal.  Nursing note and vitals reviewed.   ED Course  Procedures (including critical care time)  Results for orders placed or performed during the hospital encounter of 12/18/14  CBC with Differential/Platelet  Result Value Ref Range   WBC 6.7 4.0 - 10.5 K/uL   RBC 3.36 (L) 3.87 - 5.11 MIL/uL   Hemoglobin 11.0 (L) 12.0 - 15.0 g/dL   HCT 34.0 (L) 36.0 - 46.0 %   MCV 101.2 (H) 78.0 - 100.0 fL   MCH 32.7 26.0 - 34.0 pg   MCHC 32.4 30.0 - 36.0 g/dL   RDW 12.4 11.5 - 15.5 %   Platelets 191 150 - 400 K/uL   Neutrophils Relative % 72 43 - 77 %   Neutro Abs 4.8 1.7 - 7.7 K/uL   Lymphocytes Relative 18 12 - 46 %   Lymphs Abs 1.2 0.7 - 4.0 K/uL   Monocytes Relative 7 3 - 12 %   Monocytes Absolute 0.5 0.1 - 1.0 K/uL   Eosinophils Relative 2 0 - 5 %   Eosinophils Absolute 0.1 0.0 - 0.7 K/uL   Basophils Relative 1 0 - 1 %   Basophils Absolute 0.1 0.0 - 0.1 K/uL  Comprehensive metabolic panel   Result Value Ref Range   Sodium 140 135 - 145 mmol/L   Potassium 5.3 (H) 3.5 - 5.1 mmol/L   Chloride 107 96 - 112 mmol/L   CO2 25 19 - 32 mmol/L   Glucose, Bld 100 (H) 70 - 99 mg/dL   BUN 24 (H) 6 - 23 mg/dL   Creatinine, Ser 1.14 (H) 0.50 - 1.10 mg/dL   Calcium 9.5 8.4 - 10.5 mg/dL   Total Protein 6.6 6.0 - 8.3 g/dL   Albumin 3.4 (L) 3.5 - 5.2 g/dL   AST 22 0 - 37 U/L   ALT 22 0 - 35 U/L   Alkaline Phosphatase 41 39 - 117 U/L   Total Bilirubin 0.7 0.3 - 1.2 mg/dL   GFR calc non Af Amer 47 (L) >90 mL/min   GFR calc Af Amer 54 (L) >90 mL/min   Anion gap 8 5 - 15  Protime-INR  Result Value Ref Range   Prothrombin Time 14.0 11.6 - 15.2 seconds   INR 1.07 0.00 - 1.49  Urinalysis, Routine w reflex microscopic  Result Value Ref Range   Color, Urine YELLOW YELLOW   APPearance CLEAR CLEAR   Specific Gravity, Urine 1.006 1.005 - 1.030   pH 5.5 5.0 - 8.0   Glucose, UA NEGATIVE NEGATIVE mg/dL   Hgb urine dipstick NEGATIVE NEGATIVE   Bilirubin Urine NEGATIVE NEGATIVE   Ketones, ur NEGATIVE NEGATIVE mg/dL   Protein, ur NEGATIVE NEGATIVE mg/dL   Urobilinogen, UA 0.2 0.0 - 1.0 mg/dL   Nitrite NEGATIVE NEGATIVE   Leukocytes, UA NEGATIVE NEGATIVE  Troponin I  Result Value Ref Range   Troponin I <0.03 <0.031 ng/mL  Sedimentation rate  Result Value Ref Range   Sed Rate 25 (H) 0 - 22 mm/hr    Labs Review Labs Reviewed  CBC WITH DIFFERENTIAL/PLATELET - Abnormal; Notable for the following:    RBC 3.36 (*)    Hemoglobin 11.0 (*)    HCT 34.0 (*)    MCV 101.2 (*)    All other components within normal limits  COMPREHENSIVE METABOLIC PANEL - Abnormal; Notable for the  following:    Potassium 5.3 (*)    Glucose, Bld 100 (*)    BUN 24 (*)    Creatinine, Ser 1.14 (*)    Albumin 3.4 (*)    GFR calc non Af Amer 47 (*)    GFR calc Af Amer 54 (*)    All other components within normal limits  SEDIMENTATION RATE - Abnormal; Notable for the following:    Sed Rate 25 (*)    All other  components within normal limits  PROTIME-INR  URINALYSIS, ROUTINE W REFLEX MICROSCOPIC  TROPONIN I  C-REACTIVE PROTEIN    Imaging Review Ct Head Wo Contrast  12/18/2014   CLINICAL DATA:  Headache, generalized facial numbness.  EXAM: CT HEAD WITHOUT CONTRAST  TECHNIQUE: Contiguous axial images were obtained from the base of the skull through the vertex without intravenous contrast.  COMPARISON:  CT scan of September 25, 2004.  FINDINGS: Bony calvarium appears intact. Mild chronic ischemic white matter disease is noted. No mass effect or midline shift is noted. Ventricular size is within normal limits. There is no evidence of mass lesion, hemorrhage or acute infarction.  IMPRESSION: Mild chronic ischemic white matter disease. No acute intracranial abnormality seen.   Electronically Signed   By: Marijo Conception, M.D.   On: 12/18/2014 13:22     EKG Interpretation   Date/Time:  Monday December 18 2014 12:18:58 EDT Ventricular Rate:  49 PR Interval:  146 QRS Duration: 70 QT Interval:  436 QTC Calculation: 394 R Axis:   54 Text Interpretation:  Age not entered, assumed to be  74 years old for  purpose of ECG interpretation Sinus bradycardia Atrial premature complex  ST elev, probable normal early repol pattern No significant change since  last tracing Confirmed by Kenna Gilbert, KRISTEN [id number]) on 12/18/2014 12:33:05  PM       2:37 PM This provider re-assessed patient. Patient found to be sleeping. Patient reported that her headache has improved some, but stated that she continues to have a mild headache.   3:59 PM This provider re-assessed the patient. Patient reported that she is feeling much better. Patient able to tolerate [place] sandwich and drink without difficulty. Patient was seen and assessed by attending physician, Dr. Raliegh Ip. Ward. As per physician, recommended patient to be discharged with Tegretol 200 mg for a 30 day supply. Recommended Neuro consult.   MDM   Final diagnoses:   Trigeminal neuralgia of right side of face    Medications  acetaminophen (TYLENOL) tablet 1,000 mg (1,000 mg Oral Given 12/18/14 1249)  sodium chloride 0.9 % bolus 1,000 mL (0 mLs Intravenous Stopped 12/18/14 1359)  acetaminophen (TYLENOL) tablet 650 mg (650 mg Oral Given 12/18/14 1346)  carbamazepine (TEGRETOL) tablet 200 mg (200 mg Oral Given 12/18/14 1512)    Filed Vitals:   12/18/14 1545 12/18/14 1559 12/18/14 1615 12/18/14 1630  BP: 100/74 142/60 139/58 146/54  Pulse: 44 48 70 53  Temp:      TempSrc:      Resp: 17 15 21 17   Height:      Weight:      SpO2: 100% 99% 99% 98%    EKG noted heart rate 49 bpm with sinus bradycardia no significant changes/tracing. Patient reported that she has history of sinus bradycardia. Troponin negative elevation. PT/INR within normal limits. CBC negative elevated leukocytosis. Hemoglobin 11.0, hematocrit 34.0. CMP noted very mild hyperkalemia 5.3. BUN 24, creatinine 1.14 - when compared to previous labs, patient's Creatinine has been as  high as 1.33, patient has history of renal insufficiency and is followed by nephrology. Sedimentation rate 25. Urinalysis negative for hemoglobin, nitrites, leukocytes-negative findings of infection. CT head noted mild chronic ischemic white matter disease with no acute intracranial abnormalities. Doubt SAH. Doubt ICH. Doubt temporal arteritis. Suspicion to be trigeminal neuralgia. Very mild hyperkalemia - negative EKG changes noted. Patient seen and assessed by attending physician, Dr. Raliegh Ip. Ward who agrees to plan of discharge - recommended patient to be treated with Tegretol 200 mg as an outpatient. Negative focal neurological deficits. Negative change in neuro status or mentation while in the ED setting. Patient stable, afebrile. Patient not septic appearing. Negative signs of respiratory distress. Discharged patient. Referred patient to PCP and Neurology. Recommended that patient be re-assessed by physician later this week  and for repeat potassium within 72 hours - pateint understood and agreed to plan of care. Discussed with patient to rest and stay hydrated. Discussed with patient to closely monitor symptoms and if symptoms are to worsen or change to report back to the ED - strict return instructions given.  Patient agreed to plan of care, understood, all questions answered.   Jamse Mead, PA-C 12/18/14 1703

## 2014-12-18 NOTE — ED Provider Notes (Signed)
Medical screening examination/treatment/procedure(s) were conducted as a shared visit with non-physician practitioner(s) and myself.  I personally evaluated the patient during the encounter.   EKG Interpretation   Date/Time:  Monday December 18 2014 12:18:58 EDT Ventricular Rate:  49 PR Interval:  146 QRS Duration: 70 QT Interval:  436 QTC Calculation: 394 R Axis:   54 Text Interpretation:  Age not entered, assumed to be  74 years old for  purpose of ECG interpretation Sinus bradycardia Atrial premature complex  ST elev, probable normal early repol pattern No significant change since  last tracing Confirmed by WARD,  DO, KRISTEN [phone]) on 12/18/2014 12:33:05  PM      Pt is a 74 y.o. female who has a history of hypertension, hyperlipidemia, hypothyroidism who presents emergency department with right-sided facial pain and a warm sensation, numbness that started when walking on a treadmill today. No chest pain or shortness of breath. No other numbness, tingling or focal weakness. She is tender to palpation over her right temple but is currently neurologically intact. Patient is on aspirin but no other anticoagulation. Hemodynamically stable. Requesting Tylenol for pain. Afebrile. No meningismus. Recent head injury. Denies vision changes. No jaw claudication. Head CT unremarkable.  Patient has mild hyperkalemia with no EKG changes. Received IV fluids. ESR is 25 which is just slightly above the normal range. No leukocytosis.   Patient reports feeling better after Tegretol. May be trigeminal neuralgia. We'll discharge with prescription for the same and outpatient neurology follow-up.  [place], DO 12/18/14 [phone]

## 2014-12-18 NOTE — Telephone Encounter (Signed)
Last f/u appt 10/2014 

## 2014-12-19 ENCOUNTER — Other Ambulatory Visit: Payer: Self-pay | Admitting: Family Medicine

## 2014-12-20 ENCOUNTER — Ambulatory Visit (INDEPENDENT_AMBULATORY_CARE_PROVIDER_SITE_OTHER): Payer: Medicare Other | Admitting: Family Medicine

## 2014-12-20 ENCOUNTER — Encounter: Payer: Self-pay | Admitting: Family Medicine

## 2014-12-20 VITALS — BP 138/70 | HR 57 | Temp 98.0°F | Wt 157.8 lb

## 2014-12-20 DIAGNOSIS — R519 Headache, unspecified: Secondary | ICD-10-CM | POA: Insufficient documentation

## 2014-12-20 DIAGNOSIS — R51 Headache: Secondary | ICD-10-CM

## 2014-12-20 NOTE — Patient Instructions (Signed)
Trigeminal Neuralgia  Trigeminal neuralgia is a nerve disorder that causes sudden attacks of severe facial pain. It is caused by damage to the trigeminal nerve, a major nerve in the face. It is more common in women and in the elderly, although it can also happen in younger patients. Attacks last from a few seconds to several minutes and can occur from a couple of times per year to several times per day. Trigeminal neuralgia can be a very distressing and disabling condition. Surgery may be needed in very severe cases if medical treatment does not give relief.  HOME CARE INSTRUCTIONS    If your caregiver prescribed medication to help prevent attacks, take as directed.   To help prevent attacks:   Chew on the unaffected side of the mouth.   Avoid touching your face.   Avoid blasts of hot or cold air.   Men may wish to grow a beard to avoid having to shave.  SEEK IMMEDIATE MEDICAL CARE IF:   Pain is unbearable and your medicine does not help.   You develop new, unexplained symptoms (problems).   You have problems that may be related to a medication you are taking.  Document Released: 09/19/2000 Document Revised: 12/15/2011 Document Reviewed: 07/20/2009  [employer] Patient Information 2015 [employer]. This information is not intended to replace advice given to you by your health care provider. Make sure you discuss any questions you have with your health care provider.

## 2014-12-20 NOTE — Assessment & Plan Note (Signed)
New- resolved. Agree with probable Trigeminal Neuraglia. >25 minutes spent in face to face time with patient, >50% spent in counselling or coordination of care She had many questions about Tegretol and this diagnosis.  I answered as many questions as I could- she seemed reassured, and urged her to discuss further with neuro. The patient indicates understanding of these issues and agrees with the plan.

## 2014-12-20 NOTE — Progress Notes (Signed)
Pre visit review using our clinic review tool, if applicable. No additional management support is needed unless otherwise documented below in the visit note. 

## 2014-12-20 NOTE — Progress Notes (Signed)
Subjective:   Patient ID: Carla Lyons, female    DOB: 1940-11-24, 74 y.o.   MRN: [id number]  Carla Lyons is a pleasant 74 y.o. year old female who presents to clinic today with Hospitalization Follow-up  on 12/20/2014  HPI: ER notes reviewed.  Went to [facility] ED two days ago for acute onset of stabbing right sided face and head pain.  Pain would last only a few seconds and return.  Head CT neg EKG unremarkable Troponin neg Kidney function stable  Trigeminal neuralgia suspected and sent home on Tegretol 200 mg daily and has follow up with Neuro a week from today.  She has had no recurrent pain since.  Current Outpatient Prescriptions on File Prior to Visit  Medication Sig Dispense Refill  . ALPRAZolam (XANAX) 0.25 MG tablet TAKE 1 TABLET BY MOUTH EVERY NIGHT AT BEDTIME AS NEEDED. (Patient taking differently: TAKE 1/2 - 1  TABLET BY MOUTH EVERY NIGHT AT BEDTIME AS NEEDED.) 30 tablet 0  . aspirin (ADULT ASPIRIN EC LOW STRENGTH) 81 MG EC tablet Take 81 mg by mouth daily. mon wd fri    . atorvastatin (LIPITOR) 20 MG tablet Take 1 tablet (20 mg total) by mouth daily. 30 tablet 5  . benazepril (LOTENSIN) 10 MG tablet TAKE 1 TABLET BY MOUTH DAILY 30 tablet 5  . carbamazepine (TEGRETOL) 200 MG tablet Take 1 tablet (200 mg total) by mouth daily. 30 tablet 0  . FERROUS SULFATE PO Take 1 tablet by mouth daily. 5 grams.  Take one tablet at bedtime with orange juice    . Glucosamine HCl (GLUCOSAMINE MAXIMUM STRENGTH PO) Take 1 tablet by mouth 2 (two) times daily. Take one by mouth twice a day    . levothyroxine (SYNTHROID, LEVOTHROID) 100 MCG tablet TAKE 1 TABLET BY MOUTH DAILY 30 tablet 4  . multivitamin (THERAGRAN) per tablet Take 1 tablet by mouth daily.      . Omega-3 Fatty Acids (FISH OIL) 1000 MG CAPS Take by mouth. Take two daily     . valACYclovir (VALTREX) 1000 MG tablet TAKE 1 TABLET BY MOUTH DAILY 30 tablet 1  . Vitamin D, Ergocalciferol, (DRISDOL) 50000 UNITS CAPS capsule Take 50,000 Units  by mouth every 30 (thirty) days. Started in August, is to take for 6 months     No current facility-administered medications on file prior to visit.    Allergies  Allergen Reactions  . Codeine Phosphate     REACTION: Nausea/vomiting  . Fosamax [Alendronate Sodium]     Red and hot all over    Past Medical History  Diagnosis Date  . Colon polyps 06-15-07  . Hypertension   . Osteoporosis   . Allergy   . Thyroid disease   . Personal history of colonic adenomas 07/19/2007  . Renal insufficiency     Past Surgical History  Procedure Laterality Date  . Partial hysterectomy  age 51  . Appendectomy    . Vaginal hysterectomy  age 80    partial  . Cesarean section      x 2    Family History  Problem Relation Age of Onset  . Coronary artery disease Mother   . Other Mother     bypass surgery  . Osteoarthritis Father   . Hypertension Brother   . Heart attack Maternal Grandfather   . Lung disease Brother   . Colon cancer Neg Hx   . Esophageal cancer Neg Hx   . Rectal cancer Neg Hx   . Stomach  cancer Neg Hx     History   Social History  . Marital Status: Widowed    Spouse Name: N/A  . Number of Children: 2  . Years of Education: N/A   Occupational History  . Retired     Social History Main Topics  . Smoking status: Former Smoker    Quit date: 10/07/2001  . Smokeless tobacco: Never Used  . Alcohol Use: Yes     Comment: rare  . Drug Use: No  . Sexual Activity: Not on file   Other Topics Concern  . Not on file   Social History Narrative   Does have a living will.   Daughter is HPOA- does not want any extra ordinary measures.  Would want CPR.   The PMH, PSH, Social History, Family History, Medications, and allergies have been reviewed in [facility], and have been updated if relevant.     Review of Systems  Constitutional: Negative.   Eyes: Negative.   Respiratory: Negative.   Cardiovascular: Negative.   Gastrointestinal: Negative.   Musculoskeletal: Negative.    Skin: Negative.   Neurological: Positive for headaches. Negative for dizziness, tremors, syncope, facial asymmetry, speech difficulty, weakness, light-headedness and numbness.  Hematological: Negative.   Psychiatric/Behavioral: Negative.   All other systems reviewed and are negative.      Objective:    BP 138/70 mmHg  Pulse 57  Temp(Src) 98 F (36.7 C) (Oral)  Wt 157 lb 12 oz (71.555 kg)  SpO2 97%   Physical Exam  Constitutional: She is oriented to person, place, and time. She appears well-developed and well-nourished. No distress.  HENT:  Head: Normocephalic and atraumatic.  Eyes: Conjunctivae are normal.  Neck: Normal range of motion.  Cardiovascular: Normal rate.   Pulmonary/Chest: Effort normal.  Musculoskeletal: Normal range of motion. She exhibits no edema.  Neurological: She is alert and oriented to person, place, and time. No cranial nerve deficit.  Skin: Skin is warm and dry.  Psychiatric: She has a normal mood and affect. Her behavior is normal. Judgment and thought content normal.          Assessment & Plan:   No diagnosis found. No Follow-up on file.

## 2014-12-27 ENCOUNTER — Encounter: Payer: Self-pay | Admitting: Neurology

## 2014-12-27 ENCOUNTER — Ambulatory Visit (INDEPENDENT_AMBULATORY_CARE_PROVIDER_SITE_OTHER): Payer: Medicare Other | Admitting: Neurology

## 2014-12-27 VITALS — BP 117/71 | HR 58 | Temp 97.4°F | Ht 63.0 in | Wt 157.5 lb

## 2014-12-27 DIAGNOSIS — G500 Trigeminal neuralgia: Secondary | ICD-10-CM | POA: Diagnosis not present

## 2014-12-27 MED ORDER — GABAPENTIN 300 MG PO CAPS: 300.0000 mg | ORAL_CAPSULE | Freq: Three times a day (TID) | ORAL | Status: DC | PRN

## 2014-12-27 NOTE — Patient Instructions (Signed)
Overall you are doing fairly well but I do want to suggest a few things today:   Remember to drink plenty of fluid, eat healthy meals and do not skip any meals. Try to eat protein with a every meal and eat a healthy snack such as fruit or nuts in between meals. Try to keep a regular sleep-wake schedule and try to exercise daily, particularly in the form of walking, 20-30 minutes a day, if you can.   As far as your medications are concerned, I would like to suggest: Neurontin (Gabapentin) 300mg  three times a day as needed  Our phone number is [phone]. We also have an after hours call service for urgent matters and there is a physician on-call for urgent questions. For any emergencies you know to call 911 or go to the nearest emergency room
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
## 2015-01-04 DIAGNOSIS — G500 Trigeminal neuralgia: Secondary | ICD-10-CM | POA: Insufficient documentation

## 2015-01-04 HISTORY — DX: Trigeminal neuralgia: G50.0

## 2015-02-08 ENCOUNTER — Encounter: Payer: Self-pay | Admitting: Family Medicine

## 2015-02-23 ENCOUNTER — Other Ambulatory Visit: Payer: Self-pay | Admitting: Family Medicine

## 2015-02-23 NOTE — Telephone Encounter (Signed)
Rx called in to requested pharmacy 

## 2015-02-23 NOTE — Telephone Encounter (Signed)
Last f/u appt 10/2014 

## 2015-04-15 ENCOUNTER — Other Ambulatory Visit: Payer: Self-pay | Admitting: Family Medicine

## 2015-04-16 NOTE — Telephone Encounter (Signed)
Rx called into pharmacy as directed.

## 2015-04-16 NOTE — Telephone Encounter (Signed)
Last office visit 12/20/14 / Acute. Last refilled 02/23/15 #30 Is it okay to refill?

## 2015-05-02 ENCOUNTER — Ambulatory Visit: Payer: Medicare Other | Admitting: Family Medicine

## 2015-05-14 ENCOUNTER — Encounter: Payer: Self-pay | Admitting: Family Medicine

## 2015-05-14 ENCOUNTER — Ambulatory Visit (INDEPENDENT_AMBULATORY_CARE_PROVIDER_SITE_OTHER): Payer: Medicare Other | Admitting: Family Medicine

## 2015-05-14 VITALS — BP 144/82 | HR 60 | Temp 98.4°F | Wt 164.0 lb

## 2015-05-14 DIAGNOSIS — H16002 Unspecified corneal ulcer, left eye: Secondary | ICD-10-CM | POA: Diagnosis not present

## 2015-05-14 DIAGNOSIS — S0502XA Injury of conjunctiva and corneal abrasion without foreign body, left eye, initial encounter: Secondary | ICD-10-CM | POA: Diagnosis not present

## 2015-05-14 MED ORDER — ERYTHROMYCIN 5 MG/GM OP OINT: TOPICAL_OINTMENT | OPHTHALMIC | Status: DC

## 2015-05-14 NOTE — Patient Instructions (Signed)
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
## 2015-05-14 NOTE — Progress Notes (Signed)
Pre visit review using our clinic review tool, if applicable. No additional management support is needed unless otherwise documented below in the visit note. 

## 2015-06-02 ENCOUNTER — Other Ambulatory Visit: Payer: Self-pay | Admitting: Family Medicine

## 2015-06-04 NOTE — Telephone Encounter (Signed)
Last f/u appt 10/2014 

## 2015-06-04 NOTE — Telephone Encounter (Signed)
Rx called in to requested pharmacy 

## 2015-06-29 ENCOUNTER — Other Ambulatory Visit: Payer: Self-pay | Admitting: Family Medicine

## 2015-07-17 ENCOUNTER — Other Ambulatory Visit: Payer: Self-pay | Admitting: Family Medicine

## 2015-07-18 ENCOUNTER — Other Ambulatory Visit: Payer: Self-pay | Admitting: Family Medicine

## 2015-07-18 NOTE — Telephone Encounter (Signed)
Rx called in to requested pharmacy. Lm on pts vm and informed her OV required for any additional refills

## 2015-07-18 NOTE — Telephone Encounter (Signed)
According to pt chart, anxiety not addressed in 2+yrs

## 2015-10-05 ENCOUNTER — Ambulatory Visit (INDEPENDENT_AMBULATORY_CARE_PROVIDER_SITE_OTHER): Payer: Medicare Other

## 2015-10-05 ENCOUNTER — Ambulatory Visit: Payer: Medicare Other

## 2015-10-05 ENCOUNTER — Ambulatory Visit (INDEPENDENT_AMBULATORY_CARE_PROVIDER_SITE_OTHER): Payer: Medicare Other | Admitting: Podiatry

## 2015-10-05 ENCOUNTER — Encounter: Payer: Self-pay | Admitting: Podiatry

## 2015-10-05 VITALS — BP 130/70 | HR 87 | Resp 12

## 2015-10-05 DIAGNOSIS — L600 Ingrowing nail: Secondary | ICD-10-CM | POA: Diagnosis not present

## 2015-10-05 DIAGNOSIS — M2041 Other hammer toe(s) (acquired), right foot: Secondary | ICD-10-CM

## 2015-10-05 DIAGNOSIS — M779 Enthesopathy, unspecified: Secondary | ICD-10-CM | POA: Diagnosis not present

## 2015-10-05 DIAGNOSIS — M2042 Other hammer toe(s) (acquired), left foot: Secondary | ICD-10-CM

## 2015-10-05 MED ORDER — TRIAMCINOLONE ACETONIDE 10 MG/ML IJ SUSP
10.0000 mg | Freq: Once | INTRAMUSCULAR | Status: AC
  Administered 2015-10-05: 10 mg

## 2015-10-05 NOTE — Progress Notes (Signed)
Subjective:     Patient ID: Carla Lyons, female   DOB: 03-Jan-1941, 74 y.o.   MRN: [id number]  HPI patient presents stating I have a painful ingrown toenail on my left big toe and my second nail left is thickened and I cannot cut it and painful and I also have a hammertoe second left and pain on top of my right foot with inflammation   Review of Systems  All other systems reviewed and are negative.      Objective:   Physical Exam  Constitutional: She is oriented to person, place, and time.  Cardiovascular: Intact distal pulses.   Musculoskeletal: Normal range of motion.  Neurological: She is oriented to person, place, and time.  Skin: Skin is warm.  Nursing note and vitals reviewed.  neurovascular status is intact muscle strength adequate range of motion within normal limits with patient noted to have a incurvated medial border left hallux and a thickened damaged second nail left. Also noted to have pain on top of the right foot and a rigid contracture second digit of the left foot. 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 I've recommended removal of the nail border and removal of the second nail and explained procedures and she wants these done understanding risk. Today I infiltrated the toes with 120 mg Xylocaine Marcaine mixture and remove the border and then remove the second nail exposing matrix and applying phenol for applications 30 seconds followed by alcohol lavage and sterile dressing. For the right I injected the dorsal tendon complex midtarsal joint 3 mg Kenalog 5 mg Xylocaine and I also applied padding to the second digit to protect against hammertoe with possibility for digital fusion in future

## 2015-10-05 NOTE — Progress Notes (Signed)
   Subjective:    Patient ID: Carla Lyons, female    DOB: 10-Nov-1940, 74 y.o.   MRN: [id number]  HPI  PT STATED LT FOOT GREAT TOENAIL AND 2ND TOE BEEN HURTING FOR 3 WEEKS. TOENAIL GETTING BETTER BUT 2ND TOE HURTS MORE WHEN WEARING SHOES. TRIED NO TREATMENT.  Review of Systems  Hematological: Bruises/bleeds easily.       Objective:   Physical Exam        Assessment & Plan:

## 2015-10-05 NOTE — Patient Instructions (Signed)

## 2015-10-06 ENCOUNTER — Other Ambulatory Visit: Payer: Self-pay | Admitting: Family Medicine

## 2015-10-09 ENCOUNTER — Telehealth: Payer: Self-pay | Admitting: *Deleted

## 2015-10-09 NOTE — Telephone Encounter (Signed)
Called patient at [phone] (Home #) to check to see how they were doing from their ingrown toenail procedure that was performed on Friday, October 05, 2015. Pt stated, "Toe hurt the first night, but took Tylenol with some relief". Pt also stated, "doing great and has no problems".

## 2015-11-13 ENCOUNTER — Other Ambulatory Visit: Payer: Self-pay | Admitting: Family Medicine

## 2016-04-26 ENCOUNTER — Emergency Department: Payer: Medicare Other

## 2016-04-26 ENCOUNTER — Emergency Department
Admission: EM | Admit: 2016-04-26 | Discharge: 2016-04-26 | Disposition: A | Discharge status: Home / Self Care | Payer: Medicare Other | Attending: Emergency Medicine | Admitting: Emergency Medicine

## 2016-04-26 ENCOUNTER — Encounter: Payer: Self-pay | Admitting: Emergency Medicine

## 2016-04-26 DIAGNOSIS — N183 Chronic kidney disease, stage 3 (moderate): Secondary | ICD-10-CM | POA: Diagnosis not present

## 2016-04-26 DIAGNOSIS — I129 Hypertensive chronic kidney disease with stage 1 through stage 4 chronic kidney disease, or unspecified chronic kidney disease: Secondary | ICD-10-CM | POA: Diagnosis not present

## 2016-04-26 DIAGNOSIS — R51 Headache: Secondary | ICD-10-CM | POA: Insufficient documentation

## 2016-04-26 DIAGNOSIS — E039 Hypothyroidism, unspecified: Secondary | ICD-10-CM | POA: Insufficient documentation

## 2016-04-26 DIAGNOSIS — Z79899 Other long term (current) drug therapy: Secondary | ICD-10-CM | POA: Insufficient documentation

## 2016-04-26 DIAGNOSIS — R110 Nausea: Secondary | ICD-10-CM | POA: Diagnosis present

## 2016-04-26 DIAGNOSIS — Z7982 Long term (current) use of aspirin: Secondary | ICD-10-CM | POA: Diagnosis not present

## 2016-04-26 DIAGNOSIS — E860 Dehydration: Secondary | ICD-10-CM | POA: Insufficient documentation

## 2016-04-26 DIAGNOSIS — Z87891 Personal history of nicotine dependence: Secondary | ICD-10-CM | POA: Diagnosis not present

## 2016-04-26 LAB — BASIC METABOLIC PANEL
Anion gap: 7 (ref 5–15)
BUN: 27 mg/dL — ABNORMAL HIGH (ref 6–20)
CO2: 28 mmol/L (ref 22–32)
Calcium: 9 mg/dL (ref 8.9–10.3)
Chloride: 106 mmol/L (ref 101–111)
Creatinine, Ser: 1.29 mg/dL — ABNORMAL HIGH (ref 0.44–1.00)
GFR calc Af Amer: 46 mL/min — ABNORMAL LOW (ref >60)
GFR calc non Af Amer: 40 mL/min — ABNORMAL LOW (ref >60)
Glucose, Bld: 100 mg/dL — ABNORMAL HIGH (ref 65–99)
Potassium: 4.1 mmol/L (ref 3.5–5.1)
Sodium: 141 mmol/L (ref 135–145)

## 2016-04-26 LAB — CBC
HCT: 33.4 % — ABNORMAL LOW (ref 35.0–47.0)
Hemoglobin: 11.5 g/dL — ABNORMAL LOW (ref 12.0–16.0)
MCH: 34.4 pg — ABNORMAL HIGH (ref 26.0–34.0)
MCHC: 34.4 g/dL (ref 32.0–36.0)
MCV: 99.9 fL (ref 80.0–100.0)
Platelets: 194 10*3/uL (ref 150–440)
RBC: 3.34 MIL/uL — ABNORMAL LOW (ref 3.80–5.20)
RDW: 13 % (ref 11.5–14.5)
WBC: 6.6 10*3/uL (ref 3.6–11.0)

## 2016-04-26 LAB — URINALYSIS COMPLETE WITH MICROSCOPIC (ARMC ONLY)
Bilirubin Urine: NEGATIVE
Glucose, UA: NEGATIVE mg/dL
Hgb urine dipstick: NEGATIVE
Ketones, ur: NEGATIVE mg/dL
Leukocytes, UA: NEGATIVE
Nitrite: NEGATIVE
Protein, ur: NEGATIVE mg/dL
Specific Gravity, Urine: 1.008 (ref 1.005–1.030)
pH: 5 (ref 5.0–8.0)

## 2016-04-26 LAB — GLUCOSE, CAPILLARY: Glucose-Capillary: 92 mg/dL (ref 65–99)

## 2016-04-26 MED ORDER — SODIUM CHLORIDE 0.9 % IV BOLUS (SEPSIS)
1000.0000 mL | Freq: Once | INTRAVENOUS | Status: AC
  Administered 2016-04-26: 1000 mL via INTRAVENOUS

## 2016-04-26 NOTE — ED Notes (Signed)
Pt presents to ED with reports of weakness and feeling washed out, pt reports nausea but denies vomiting and diarrhea. Pt reports intermittent shooting pains across the right side of head. Pt states it feels like a short electrical shock that comes and goes. Pt states it does not hurt that bad, she just is aware it is happening. Pt alert and oriented in triage.
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
## 2016-04-26 NOTE — Discharge Instructions (Signed)

## 2016-05-01 ENCOUNTER — Telehealth: Payer: Self-pay

## 2016-05-01 ENCOUNTER — Ambulatory Visit (INDEPENDENT_AMBULATORY_CARE_PROVIDER_SITE_OTHER): Payer: Medicare Other | Admitting: Podiatry

## 2016-05-01 ENCOUNTER — Ambulatory Visit (INDEPENDENT_AMBULATORY_CARE_PROVIDER_SITE_OTHER): Payer: Medicare Other

## 2016-05-01 DIAGNOSIS — M79671 Pain in right foot: Secondary | ICD-10-CM | POA: Diagnosis not present

## 2016-05-01 DIAGNOSIS — M779 Enthesopathy, unspecified: Secondary | ICD-10-CM | POA: Diagnosis not present

## 2016-05-01 MED ORDER — TRIAMCINOLONE ACETONIDE 10 MG/ML IJ SUSP
10.0000 mg | Freq: Once | INTRAMUSCULAR | Status: AC
  Administered 2016-05-01: 10 mg

## 2016-05-05 NOTE — Progress Notes (Signed)
Subjective:     Patient ID: Carla Lyons, female   DOB: 11-14-1940, 75 y.o.   MRN: [id number]  HPI patient presents with pain on the top of the right foot with inflammation fluid buildup   Review of Systems     Objective:   Physical Exam Tendinitis dorsum right with inflammation fluid buildup    Assessment:     Inflammatory tendinitis dorsal right    Plan:     Injected the dorsal tendon complex 3 mg Kenalog 5 mg Xylocaine advised on heat and ice therapy and reappoint to recheck

## 2016-08-14 ENCOUNTER — Other Ambulatory Visit: Payer: Self-pay | Admitting: Internal Medicine

## 2016-08-14 DIAGNOSIS — M5416 Radiculopathy, lumbar region: Secondary | ICD-10-CM

## 2016-08-17 ENCOUNTER — Encounter: Payer: Self-pay | Admitting: Emergency Medicine

## 2016-08-17 ENCOUNTER — Emergency Department
Admission: EM | Admit: 2016-08-17 | Discharge: 2016-08-17 | Disposition: A | Discharge status: Home / Self Care | Payer: Medicare Other | Attending: Emergency Medicine | Admitting: Emergency Medicine

## 2016-08-17 ENCOUNTER — Emergency Department: Payer: Medicare Other

## 2016-08-17 DIAGNOSIS — T373X5A Adverse effect of other antiprotozoal drugs, initial encounter: Secondary | ICD-10-CM | POA: Insufficient documentation

## 2016-08-17 DIAGNOSIS — Z87891 Personal history of nicotine dependence: Secondary | ICD-10-CM | POA: Diagnosis not present

## 2016-08-17 DIAGNOSIS — T380X5A Adverse effect of glucocorticoids and synthetic analogues, initial encounter: Secondary | ICD-10-CM | POA: Insufficient documentation

## 2016-08-17 DIAGNOSIS — N183 Chronic kidney disease, stage 3 (moderate): Secondary | ICD-10-CM | POA: Insufficient documentation

## 2016-08-17 DIAGNOSIS — R001 Bradycardia, unspecified: Secondary | ICD-10-CM | POA: Diagnosis not present

## 2016-08-17 DIAGNOSIS — E039 Hypothyroidism, unspecified: Secondary | ICD-10-CM | POA: Diagnosis not present

## 2016-08-17 DIAGNOSIS — Z79899 Other long term (current) drug therapy: Secondary | ICD-10-CM | POA: Diagnosis not present

## 2016-08-17 DIAGNOSIS — R109 Unspecified abdominal pain: Secondary | ICD-10-CM | POA: Diagnosis not present

## 2016-08-17 DIAGNOSIS — T887XXA Unspecified adverse effect of drug or medicament, initial encounter: Secondary | ICD-10-CM | POA: Diagnosis not present

## 2016-08-17 DIAGNOSIS — I129 Hypertensive chronic kidney disease with stage 1 through stage 4 chronic kidney disease, or unspecified chronic kidney disease: Secondary | ICD-10-CM | POA: Insufficient documentation

## 2016-08-17 DIAGNOSIS — Y828 Other medical devices associated with adverse incidents: Secondary | ICD-10-CM | POA: Diagnosis not present

## 2016-08-17 DIAGNOSIS — T368X5A Adverse effect of other systemic antibiotics, initial encounter: Secondary | ICD-10-CM | POA: Insufficient documentation

## 2016-08-17 DIAGNOSIS — T50905A Adverse effect of unspecified drugs, medicaments and biological substances, initial encounter: Secondary | ICD-10-CM

## 2016-08-17 DIAGNOSIS — R531 Weakness: Secondary | ICD-10-CM | POA: Diagnosis present

## 2016-08-17 LAB — BASIC METABOLIC PANEL
Anion gap: 9 (ref 5–15)
BUN: 38 mg/dL — ABNORMAL HIGH (ref 6–20)
CO2: 24 mmol/L (ref 22–32)
Calcium: 9.2 mg/dL (ref 8.9–10.3)
Chloride: 108 mmol/L (ref 101–111)
Creatinine, Ser: 1.41 mg/dL — ABNORMAL HIGH (ref 0.44–1.00)
GFR calc Af Amer: 41 mL/min — ABNORMAL LOW (ref >60)
GFR calc non Af Amer: 36 mL/min — ABNORMAL LOW (ref >60)
Glucose, Bld: 92 mg/dL (ref 65–99)
Potassium: 3.8 mmol/L (ref 3.5–5.1)
Sodium: 141 mmol/L (ref 135–145)

## 2016-08-17 LAB — URINALYSIS COMPLETE WITH MICROSCOPIC (ARMC ONLY)
Bilirubin Urine: NEGATIVE
Glucose, UA: NEGATIVE mg/dL
Hgb urine dipstick: NEGATIVE
Ketones, ur: NEGATIVE mg/dL
Nitrite: NEGATIVE
Protein, ur: NEGATIVE mg/dL
RBC / HPF: NONE SEEN RBC/hpf (ref 0–5)
Specific Gravity, Urine: 1.008 (ref 1.005–1.030)
pH: 5 (ref 5.0–8.0)

## 2016-08-17 LAB — CBC
HCT: 32.4 % — ABNORMAL LOW (ref 35.0–47.0)
Hemoglobin: 10.9 g/dL — ABNORMAL LOW (ref 12.0–16.0)
MCH: 33.4 pg (ref 26.0–34.0)
MCHC: 33.8 g/dL (ref 32.0–36.0)
MCV: 99 fL (ref 80.0–100.0)
Platelets: 221 10*3/uL (ref 150–440)
RBC: 3.27 MIL/uL — ABNORMAL LOW (ref 3.80–5.20)
RDW: 13.3 % (ref 11.5–14.5)
WBC: 9.4 10*3/uL (ref 3.6–11.0)

## 2016-08-17 LAB — GLUCOSE, CAPILLARY: Glucose-Capillary: 84 mg/dL (ref 65–99)

## 2016-08-17 MED ORDER — SODIUM CHLORIDE 0.9 % IV BOLUS (SEPSIS)
500.0000 mL | Freq: Once | INTRAVENOUS | Status: AC
  Administered 2016-08-17: 500 mL via INTRAVENOUS
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
## 2016-08-17 NOTE — Discharge Instructions (Signed)
At this time, I suggested you  stop taking the Cipro, the Flagyl, and the prednisone and that you  follow closely with primary care doctor. Return to the emergency room for any new or worrisome symptoms.

## 2016-08-17 NOTE — ED Notes (Signed)
Pt verbalized understanding of discharge instructions. NAD at this time. 

## 2016-08-17 NOTE — ED Triage Notes (Signed)
Patient presents to the ED with with feeling, "tingling" intermittently in her face, anxious and jittery.  Patient reports, "I feel confused and fuzzy headed."  Patient is alert and oriented x 4 and speaking in full sentences clearly.  Patient ambulatory to triage without difficulty.  Patient reports history of high potassium and kidney problems and states, "this is kind of how I've felt when my potassium was high."  Patient was recently started on prednisone for hip and back pain, cipro and flagyl for diverticulitis.  Patient is in no obvious distress at this time.  Patient denies pain.

## 2016-08-23 ENCOUNTER — Ambulatory Visit
Admission: RE | Admit: 2016-08-23 | Discharge: 2016-08-23 | Disposition: A | Discharge status: Home / Self Care | Payer: Medicare Other | Source: Ambulatory Visit | Attending: Internal Medicine | Admitting: Internal Medicine

## 2016-08-23 DIAGNOSIS — M5416 Radiculopathy, lumbar region: Secondary | ICD-10-CM

## 2016-11-10 ENCOUNTER — Ambulatory Visit: Payer: Medicare Other | Admitting: Dietician

## 2017-02-26 ENCOUNTER — Ambulatory Visit (INDEPENDENT_AMBULATORY_CARE_PROVIDER_SITE_OTHER): Payer: Medicare Other

## 2017-02-26 VITALS — BP 132/80 | HR 54 | Temp 97.9°F | Ht 62.0 in | Wt 148.5 lb

## 2017-02-26 DIAGNOSIS — I10 Essential (primary) hypertension: Secondary | ICD-10-CM | POA: Diagnosis not present

## 2017-02-26 DIAGNOSIS — Z23 Encounter for immunization: Secondary | ICD-10-CM | POA: Diagnosis not present

## 2017-02-26 DIAGNOSIS — N183 Chronic kidney disease, stage 3 unspecified: Secondary | ICD-10-CM

## 2017-02-26 DIAGNOSIS — Z0000 Encounter for general adult medical examination without abnormal findings: Secondary | ICD-10-CM

## 2017-02-26 DIAGNOSIS — E782 Mixed hyperlipidemia: Secondary | ICD-10-CM

## 2017-02-26 DIAGNOSIS — M810 Age-related osteoporosis without current pathological fracture: Secondary | ICD-10-CM

## 2017-02-26 DIAGNOSIS — D631 Anemia in chronic kidney disease: Secondary | ICD-10-CM

## 2017-02-26 DIAGNOSIS — E038 Other specified hypothyroidism: Secondary | ICD-10-CM

## 2017-02-26 LAB — LIPID PANEL
Cholesterol: 258 mg/dL — ABNORMAL HIGH (ref 0–200)
HDL: 45.7 mg/dL (ref >39.00)
NonHDL: 212.58
Total CHOL/HDL Ratio: 6
Triglycerides: 206 mg/dL — ABNORMAL HIGH (ref 0.0–149.0)
VLDL: 41.2 mg/dL — ABNORMAL HIGH (ref 0.0–40.0)

## 2017-02-26 LAB — CBC WITH DIFFERENTIAL/PLATELET
Basophils Absolute: 0.1 10*3/uL (ref 0.0–0.1)
Basophils Relative: 1.1 % (ref 0.0–3.0)
Eosinophils Absolute: 0.2 10*3/uL (ref 0.0–0.7)
Eosinophils Relative: 3.1 % (ref 0.0–5.0)
HCT: 34.1 % — ABNORMAL LOW (ref 36.0–46.0)
Hemoglobin: 11.5 g/dL — ABNORMAL LOW (ref 12.0–15.0)
Lymphocytes Relative: 25.9 % (ref 12.0–46.0)
Lymphs Abs: 1.4 10*3/uL (ref 0.7–4.0)
MCHC: 33.6 g/dL (ref 30.0–36.0)
MCV: 98.8 fl (ref 78.0–100.0)
Monocytes Absolute: 0.7 10*3/uL (ref 0.1–1.0)
Monocytes Relative: 12.1 % — ABNORMAL HIGH (ref 3.0–12.0)
Neutro Abs: 3.2 10*3/uL (ref 1.4–7.7)
Neutrophils Relative %: 57.8 % (ref 43.0–77.0)
Platelets: 169 10*3/uL (ref 150.0–400.0)
RBC: 3.46 Mil/uL — ABNORMAL LOW (ref 3.87–5.11)
RDW: 12.9 % (ref 11.5–15.5)
WBC: 5.6 10*3/uL (ref 4.0–10.5)

## 2017-02-26 LAB — VITAMIN D 25 HYDROXY (VIT D DEFICIENCY, FRACTURES): VITD: 39.43 ng/mL (ref 30.00–100.00)

## 2017-02-26 LAB — T4, FREE: Free T4: 2.29 ng/dL — ABNORMAL HIGH (ref 0.60–1.60)

## 2017-02-26 LAB — LDL CHOLESTEROL, DIRECT: Direct LDL: 167 mg/dL

## 2017-02-26 LAB — T3, FREE: T3, Free: 5.6 pg/mL — ABNORMAL HIGH (ref 2.3–4.2)

## 2017-02-26 LAB — COMPREHENSIVE METABOLIC PANEL
ALT: 21 U/L (ref 0–35)
AST: 24 U/L (ref 0–37)
Albumin: 4.3 g/dL (ref 3.5–5.2)
Alkaline Phosphatase: 41 U/L (ref 39–117)
BUN: 31 mg/dL — ABNORMAL HIGH (ref 6–23)
CO2: 27 mEq/L (ref 19–32)
Calcium: 9.8 mg/dL (ref 8.4–10.5)
Chloride: 104 mEq/L (ref 96–112)
Creatinine, Ser: 1.53 mg/dL — ABNORMAL HIGH (ref 0.40–1.20)
GFR: 35.17 mL/min — ABNORMAL LOW (ref >60.00)
Glucose, Bld: 83 mg/dL (ref 70–99)
Potassium: 4.7 mEq/L (ref 3.5–5.1)
Sodium: 137 mEq/L (ref 135–145)
Total Bilirubin: 0.7 mg/dL (ref 0.2–1.2)
Total Protein: 6.8 g/dL (ref 6.0–8.3)

## 2017-02-26 LAB — TSH: TSH: 0.3 u[IU]/mL — ABNORMAL LOW (ref 0.35–4.50)

## 2017-02-26 NOTE — Patient Instructions (Addendum)
Carla Lyons , Thank you for taking time to come for your Medicare Wellness Visit. I appreciate your ongoing commitment to your health goals. Please review the following plan we discussed and let me know if I can assist you in the future.   These are the goals we discussed: Goals    . Increase water intake          Starting 02/26/17, I will continue to drink at least 6-8 water or Propel daily. Additionally, I will continue to do balance class for at least 60 min once weekly.        This is a list of the screening recommended for you and due dates:  Health Maintenance  Topic Date Due  . Mammogram  02/06/2018*  . Tetanus Vaccine  06/19/2026*  . Flu Shot  05/06/2017  . Colon Cancer Screening  08/30/2018  . DEXA scan (bone density measurement)  Completed  . Pneumonia vaccines  Completed  *Topic was postponed. The date shown is not the original due date.    Preventive Care for Adults  A healthy lifestyle and preventive care can promote health and wellness. Preventive health guidelines for adults include the following key practices.  . A routine yearly physical is a good way to check with your health care provider about your health and preventive screening. It is a chance to share any concerns and updates on your health and to receive a thorough exam.  . Visit your dentist for a routine exam and preventive care every 6 months. Brush your teeth twice a day and floss once a day. Good oral hygiene prevents tooth decay and gum disease.  . The frequency of eye exams is based on your age, health, family medical history, use  of contact lenses, and other factors. Follow your health care provider's ecommendations for frequency of eye exams.  . Eat a healthy diet. Foods like vegetables, fruits, whole grains, low-fat dairy products, and lean protein foods contain the nutrients you need without too many calories. Decrease your intake of foods high in solid fats, added sugars, and salt. Eat the right  amount of calories for you. Get information about a proper diet from your health care provider, if necessary.  . Regular physical exercise is one of the most important things you can do for your health. Most adults should get at least 150 minutes of moderate-intensity exercise (any activity that increases your heart rate and causes you to sweat) each week. In addition, most adults need muscle-strengthening exercises on 2 or more days a week.  Silver Sneakers may be a benefit available to you. To determine eligibility, you may visit the website: www.silversneakers.com or contact program at 260-468-4074 Mon-Fri between 8AM-8PM.   . Maintain a healthy weight. The body mass index (BMI) is a screening tool to identify possible weight problems. It provides an estimate of body fat based on height and weight. Your health care provider can find your BMI and can help you achieve or maintain a healthy weight.   For adults 20 years and older: ? A BMI below 18.5 is considered underweight. ? A BMI of 18.5 to 24.9 is normal. ? A BMI of 25 to 29.9 is considered overweight. ? A BMI of 30 and above is considered obese.   . Maintain normal blood lipids and cholesterol levels by exercising and minimizing your intake of saturated fat. Eat a balanced diet with plenty of fruit and vegetables. Blood tests for lipids and cholesterol should begin at age 24  and be repeated every 5 years. If your lipid or cholesterol levels are high, you are over 50, or you are at high risk for heart disease, you may need your cholesterol levels checked more frequently. Ongoing high lipid and cholesterol levels should be treated with medicines if diet and exercise are not working.  . If you smoke, find out from your health care provider how to quit. If you do not use tobacco, please do not start.  . If you choose to drink alcohol, please do not consume more than 2 drinks per day. One drink is considered to be 12 ounces (355 mL) of beer, 5  ounces (148 mL) of wine, or 1.5 ounces (44 mL) of liquor.  . If you are 77-28 years old, ask your health care provider if you should take aspirin to prevent strokes.  . Use sunscreen. Apply sunscreen liberally and repeatedly throughout the day. You should seek shade when your shadow is shorter than you. Protect yourself by wearing long sleeves, pants, a wide-brimmed hat, and sunglasses year round, whenever you are outdoors.  . Once a month, do a whole body skin exam, using a mirror to look at the skin on your back. Tell your health care provider of new moles, moles that have irregular borders, moles that are larger than a pencil eraser, or moles that have changed in shape or color.

## 2017-02-26 NOTE — Progress Notes (Signed)
PCP notes:   Health maintenance:  Mammogram - PCP please address at next appt Tetanus - postponed/insurance [id number] - administered  Abnormal screenings:   Hearing - failed  Patient concerns:   None  Nurse concerns:  None  Next PCP appt:   03/05/17 @ 0915

## 2017-02-26 NOTE — Progress Notes (Signed)
Subjective:   Carla Lyons is a 76 y.o. female who presents for Medicare Annual (Subsequent) preventive examination.  Review of Systems:  N/A Cardiac Risk Factors include: advanced age (>76men, >17 women);dyslipidemia;hypertension     Objective:     Vitals: BP 132/80 (BP Location: Right Arm, Patient Position: Sitting, Cuff Size: Normal)   Pulse (!) 54   Temp 97.9 F (36.6 C) (Oral)   Ht 5\' 2"  (1.575 m) Comment: no shoes  Wt 148 lb 8 oz (67.4 kg)   SpO2 98%   BMI 27.16 kg/m   Body mass index is 27.16 kg/m.   Tobacco History  Smoking Status  . Former Smoker  . Quit date: 10/07/2001  Smokeless Tobacco  . Never Used     Counseling given: No   Past Medical History:  Diagnosis Date  . Allergy   . Colon polyps 06-15-07  . Hypertension   . Osteoporosis   . Personal history of colonic adenomas 07/19/2007  . Renal insufficiency   . Thyroid disease    Past Surgical History:  Procedure Laterality Date  . APPENDECTOMY    . CESAREAN SECTION     x 2  . PARTIAL HYSTERECTOMY  age 57  . VAGINAL HYSTERECTOMY  age 60   partial   Family History  Problem Relation Age of Onset  . Coronary artery disease Mother   . Other Mother        bypass surgery  . Osteoarthritis Father   . Heart attack Maternal Grandfather   . Lung disease Brother   . Hypertension Brother   . Colon cancer Neg Hx   . Esophageal cancer Neg Hx   . Rectal cancer Neg Hx   . Stomach cancer Neg Hx    History  Sexual Activity  . Sexual activity: Not on file    Outpatient Encounter Prescriptions as of 02/26/2017  Medication Sig  . ALPRAZolam (XANAX) 0.25 MG tablet TAKE ONE TABLET BY MOUTH EVERY NIGHT AT BEDTIME  . aspirin (ADULT ASPIRIN EC LOW STRENGTH) 81 MG EC tablet Take 81 mg by mouth daily.   Marland Kitchen b complex vitamins tablet Take 1 tablet by mouth daily.  . benazepril (LOTENSIN) 10 MG tablet Take 1 tablet (10 mg total) by mouth daily. OFFICE VISIT REQUIRED OR ADDITIONAL REFILLS  . cholecalciferol  (VITAMIN D) 1000 UNITS tablet Take 2,000 Units by mouth daily.  Marland Kitchen FERROUS SULFATE PO Take 1 tablet by mouth daily. 5 grams.  Take one tablet at bedtime with orange juice  . furosemide (LASIX) 20 MG tablet Take 20 mg by mouth as needed for edema.  . hydrochlorothiazide (HYDRODIURIL) 12.5 MG tablet   . levothyroxine (SYNTHROID, LEVOTHROID) 100 MCG tablet Take 1 tablet (100 mcg total) by mouth daily. OFFICE VISIT WITH LABS REQUIRED FOR ADDITIONAL REFILLS  . Omega-3 Fatty Acids (FISH OIL) 1000 MG CAPS Take by mouth. Take two daily   . [DISCONTINUED] atorvastatin (LIPITOR) 20 MG tablet Take 1 tablet (20 mg total) by mouth daily. OFFICE VISIT REQUIRED OR ADDITIONAL REFILLS  . [DISCONTINUED] carbamazepine (TEGRETOL) 200 MG tablet Take 1 tablet (200 mg total) by mouth daily.  . [DISCONTINUED] erythromycin ophthalmic ointment Apply to left lower eyelid 6 times a day  . [DISCONTINUED] gabapentin (NEURONTIN) 300 MG capsule Take 1 capsule (300 mg total) by mouth 3 (three) times daily as needed.  . [DISCONTINUED] Glucosamine HCl (GLUCOSAMINE MAXIMUM STRENGTH PO) Take 1 tablet by mouth 2 (two) times daily. Take one by mouth twice a day  . [  DISCONTINUED] KIONEX 15 GM/60ML suspension Take 1 Dose by mouth daily.  . [DISCONTINUED] multivitamin (THERAGRAN) per tablet Take 1 tablet by mouth daily.    . [DISCONTINUED] valACYclovir (VALTREX) 1000 MG tablet TAKE 1 TABLET BY MOUTH DAILY   No facility-administered encounter medications on file as of 02/26/2017.     Activities of Daily Living In your present state of health, do you have any difficulty performing the following activities: 02/26/2017  Hearing? N  Vision? N  Difficulty concentrating or making decisions? N  Walking or climbing stairs? N  Dressing or bathing? N  Doing errands, shopping? N  Preparing Food and eating ? N  Using the Toilet? N  In the past six months, have you accidently leaked urine? N  Do you have problems with loss of bowel control? N    Managing your Medications? N  Managing your Finances? N  Housekeeping or managing your Housekeeping? N  Some recent data might be hidden    Patient Care Team: Lucille Passy, MD as PCP - General (Family Medicine)    Assessment:     Hearing Screening   125Hz  250Hz  500Hz  1000Hz  2000Hz  3000Hz  4000Hz  6000Hz  8000Hz   Right ear:   0 0 40  0    Left ear:   40 0 40  0      Visual Acuity Screening   Right eye Left eye Both eyes  Without correction: 20/40-1 20/40-1 20/40  With correction:       Exercise Activities and Dietary recommendations Current Exercise Habits: Structured exercise class, Type of exercise: Other - see comments (balance class), Time (Minutes): 60, Frequency (Times/Week): 1, Weekly Exercise (Minutes/Week): 60, Intensity: Mild, Exercise limited by: None identified  Goals    . Increase water intake          Starting 02/26/17, I will continue to drink at least 6-8 water or Propel daily. Additionally, I will continue to do balance class for at least 60 min once weekly.       Fall Risk Fall Risk  02/26/2017 03/09/2014  Falls in the past year? No No   Depression Screen PHQ 2/9 Scores 02/26/2017 03/09/2014  PHQ - 2 Score 0 0     Cognitive Function MMSE - Mini Mental State Exam 02/26/2017  Orientation to time 5  Orientation to Place 5  Registration 3  Attention/ Calculation 0  Recall 3  Language- name 2 objects 0  Language- repeat 1  Language- follow 3 step command 3  Language- read & follow direction 0  Write a sentence 0  Copy design 0  Total score 20       PLEASE NOTE: A Mini-Cog screen was completed. Maximum score is 20. A value of 0 denotes this part of Folstein MMSE was not completed or the patient failed this part of the Mini-Cog screening.   Mini-Cog Screening Orientation to Time - Max 5 pts Orientation to Place - Max 5 pts Registration - Max 3 pts Recall - Max 3 pts Language Repeat - Max 1 pts Language Follow 3 Step Command - Max 3  pts   Immunization History  Administered Date(s) Administered  . Influenza Split 08/05/2011  . Influenza Whole 07/17/2004, 07/10/2009, 07/02/2010  . Influenza,inj,Quad PF,36+ Mos 06/16/2013  . Influenza-Unspecified 07/01/2012, 06/19/2014, 05/25/2015  . Pneumococcal Conjugate-13 03/09/2014  . Pneumococcal Polysaccharide-23 07/17/2004  . Td 06/20/2006  . Zoster 03/09/2014   Screening Tests Health Maintenance  Topic Date Due  . MAMMOGRAM  02/06/2018 (Originally 02/07/2016)  . TETANUS/TDAP  06/19/2026 (Originally 06/20/2016)  . INFLUENZA VACCINE  05/06/2017  . COLONOSCOPY  08/30/2018  . DEXA SCAN  Completed  . PNA vac Low Risk Adult  Completed      Plan:     I have personally reviewed and addressed the Medicare Annual Wellness questionnaire and have noted the following in the patient's chart:  A. Medical and social history B. Use of alcohol, tobacco or illicit drugs  C. Current medications and supplements D. Functional ability and status E.  Nutritional status F.  Physical activity G. Advance directives H. List of other physicians I.  Hospitalizations, surgeries, and ER visits in previous 12 months J.  [place] to include hearing, vision, cognitive, depression L. Referrals and appointments - none  In addition, I have reviewed and discussed with patient certain preventive protocols, quality metrics, and best practice recommendations. A written personalized care plan for preventive services as well as general preventive health recommendations were provided to patient.  See attached scanned questionnaire for additional information.   Signed,   Lindell Noe, MHA, BS, LPN Health Coach

## 2017-02-26 NOTE — Progress Notes (Signed)
Pre visit review using our clinic review tool, if applicable. No additional management support is needed unless otherwise documented below in the visit note. 

## 2017-02-27 NOTE — Progress Notes (Signed)
I reviewed health advisor's note, was available for consultation, and agree with documentation and plan.   Signed,  Analaya Hoey T. Giada Schoppe, MD  

## 2017-03-04 ENCOUNTER — Encounter: Payer: Medicare Other | Admitting: Family Medicine

## 2017-03-05 ENCOUNTER — Encounter: Payer: Self-pay | Admitting: Family Medicine

## 2017-03-05 ENCOUNTER — Ambulatory Visit (INDEPENDENT_AMBULATORY_CARE_PROVIDER_SITE_OTHER): Payer: Medicare Other | Admitting: Family Medicine

## 2017-03-05 VITALS — BP 140/70 | HR 50 | Ht 62.0 in

## 2017-03-05 DIAGNOSIS — N183 Chronic kidney disease, stage 3 unspecified: Secondary | ICD-10-CM

## 2017-03-05 DIAGNOSIS — E782 Mixed hyperlipidemia: Secondary | ICD-10-CM | POA: Diagnosis not present

## 2017-03-05 DIAGNOSIS — E038 Other specified hypothyroidism: Secondary | ICD-10-CM

## 2017-03-05 MED ORDER — LEVOTHYROXINE SODIUM 88 MCG PO TABS: 88.0000 ug | ORAL_TABLET | Freq: Every day | ORAL | 3 refills | Status: DC

## 2017-03-05 MED ORDER — ATORVASTATIN CALCIUM 20 MG PO TABS: 20.0000 mg | ORAL_TABLET | Freq: Every day | ORAL | 3 refills | Status: DC

## 2017-03-05 NOTE — Progress Notes (Signed)
Pre visit review using our clinic review tool, if applicable. No additional management support is needed unless otherwise documented below in the visit note. 

## 2017-03-05 NOTE — Assessment & Plan Note (Signed)
Decreased. She will decrease lasix. Follow up with nephrology next month.

## 2017-03-05 NOTE — Progress Notes (Signed)
Subjective:   Patient ID: Carla Lyons, female    DOB: November 16, 1940, 76 y.o.   MRN: [id number]  Carla Lyons is a pleasant 76 y.o. year old female who presents to clinic today with Follow-up  on 03/05/2017  HPI: Medicare wellness visit with Candis Musa, RN on 02/26/17. Note reviewed.  HTN- has been well controlled on Lotensin 10 mg daily, HCTZ 12.5 mg daily.  Also takes lasix as needed for edema.  Lab Results  Component Value Date   CREATININE 1.53 (H) 02/26/2017   Hypothyroidism- complaint with synthroid 100 mcg daily. Denies any symptoms or hyper or hypothyroidism. Lab Results  Component Value Date   TSH 0.30 (L) 02/26/2017   HLD- LDL is high this month.  Stopped taking lipitor years ago due to myalgias.  She is not sure if it truly did cause this. Lab Results  Component Value Date   CHOL 258 (H) 02/26/2017   HDL 45.70 02/26/2017   LDLCALC 87 10/24/2014   LDLDIRECT 167.0 02/26/2017   TRIG 206.0 (H) 02/26/2017   CHOLHDL 6 02/26/2017   CRI- deteriorated a bit this month. Sees nephrology next month. Advised cutting back on as needed lasix.  Current Outpatient Prescriptions on File Prior to Visit  Medication Sig Dispense Refill  . ALPRAZolam (XANAX) 0.25 MG tablet TAKE ONE TABLET BY MOUTH EVERY NIGHT AT BEDTIME 30 tablet 0  . aspirin (ADULT ASPIRIN EC LOW STRENGTH) 81 MG EC tablet Take 81 mg by mouth daily.     Marland Kitchen b complex vitamins tablet Take 1 tablet by mouth daily.    . benazepril (LOTENSIN) 10 MG tablet Take 1 tablet (10 mg total) by mouth daily. OFFICE VISIT REQUIRED OR ADDITIONAL REFILLS 30 tablet 0  . cholecalciferol (VITAMIN D) 1000 UNITS tablet Take 2,000 Units by mouth daily.    Marland Kitchen FERROUS SULFATE PO Take 1 tablet by mouth daily. 5 grams.  Take one tablet at bedtime with orange juice    . furosemide (LASIX) 20 MG tablet Take 20 mg by mouth as needed for edema.    . hydrochlorothiazide (HYDRODIURIL) 12.5 MG tablet     . levothyroxine (SYNTHROID, LEVOTHROID) 100 MCG  tablet Take 1 tablet (100 mcg total) by mouth daily. OFFICE VISIT WITH LABS REQUIRED FOR ADDITIONAL REFILLS 30 tablet 0  . Omega-3 Fatty Acids (FISH OIL) 1000 MG CAPS Take by mouth. Take two daily      No current facility-administered medications on file prior to visit.     Allergies  Allergen Reactions  . Codeine Phosphate     REACTION: Nausea/vomiting  . Fosamax [Alendronate Sodium]     Red and hot all over    Past Medical History:  Diagnosis Date  . Allergy   . Colon polyps 06-15-07  . Hypertension   . Osteoporosis   . Personal history of colonic adenomas 07/19/2007  . Renal insufficiency   . Thyroid disease     Past Surgical History:  Procedure Laterality Date  . APPENDECTOMY    . CESAREAN SECTION     x 2  . PARTIAL HYSTERECTOMY  age 56  . VAGINAL HYSTERECTOMY  age 78   partial    Family History  Problem Relation Age of Onset  . Coronary artery disease Mother   . Other Mother        bypass surgery  . Osteoarthritis Father   . Heart attack Maternal Grandfather   . Lung disease Brother   . Hypertension Brother   . Colon cancer Neg  Hx   . Esophageal cancer Neg Hx   . Rectal cancer Neg Hx   . Stomach cancer Neg Hx     Social History   Social History  . Marital status: Widowed    Spouse name: N/A  . Number of children: 2  . Years of education: N/A   Occupational History  . Retired  Retired   Social History Main Topics  . Smoking status: Former Smoker    Quit date: 10/07/2001  . Smokeless tobacco: Never Used  . Alcohol use Yes     Comment: rare  . Drug use: No  . Sexual activity: Not on file   Other Topics Concern  . Not on file   Social History Narrative   Does have a living will.   Daughter is HPOA- does not want any extra ordinary measures.  Would want CPR.   The PMH, PSH, Social History, Family History, Medications, and allergies have been reviewed in [facility], and have been updated if relevant.   Review of Systems  Constitutional: Negative.     HENT: Negative.   Respiratory: Negative.   Cardiovascular: Negative.   Gastrointestinal: Negative.   Genitourinary: Negative.   Musculoskeletal: Negative.   Neurological: Negative.   Hematological: Negative.   Psychiatric/Behavioral: Negative.   All other systems reviewed and are negative.      Objective:    BP 140/70   Pulse (!) 50   Ht 5\' 2"  (1.575 m)   SpO2 98%    Physical Exam   General:  Well-developed,well-nourished,in no acute distress; alert,appropriate and cooperative throughout examination Head:  normocephalic and atraumatic.   Eyes:  vision grossly intact, PERRL Ears:  R ear normal and L ear normal externally, TMs clear bilaterally Nose:  no external deformity.   Mouth:  good dentition.   Neck:  No deformities, masses, or tenderness noted. Lungs:  Normal respiratory effort, chest expands symmetrically. Lungs are clear to auscultation, no crackles or wheezes. Heart:  Normal rate and regular rhythm. S1 and S2 normal without gallop, murmur, click, rub or other extra sounds. Abdomen:  Bowel sounds positive,abdomen soft and non-tender without masses, organomegaly or hernias noted. Msk:  No deformity or scoliosis noted of thoracic or lumbar spine.   Extremities:  No clubbing, cyanosis, edema, or deformity noted with normal full range of motion of all joints.   Neurologic:  alert & oriented X3 and gait normal.   Skin:  Intact without suspicious lesions or rashes Psych:  Cognition and judgment appear intact. Alert and cooperative with normal attention span and concentration. No apparent delusions, illusions, hallucinations       Assessment & Plan:   Other specified hypothyroidism  CKD (chronic kidney disease), stage III  HYPERLIPIDEMIA, MIXED No Follow-up on file.

## 2017-03-05 NOTE — Assessment & Plan Note (Signed)
Deteriorated. Restart lipitor 20 mg daily. Follow up labs in 4 weeks.

## 2017-03-05 NOTE — Assessment & Plan Note (Signed)
Over corrected. Decrease synthroid to 88 mcg daily. Follow up labs in 4 weeks.

## 2017-03-05 NOTE — Patient Instructions (Signed)
Great to see you. We are starting lipitor 20 mg daily and decreasing your synthroid to 88 mcg daily.  Please return in 4 weeks to repeat labs (fasting please).

## 2017-03-16 ENCOUNTER — Telehealth: Payer: Self-pay | Admitting: Family Medicine

## 2017-03-16 NOTE — Telephone Encounter (Signed)
°  [facility] Patient Name: Carla Lyons DOB: 12-19-1940 Initial Comment Caller states she's having bruising on her leg. She got some Ticks off of her. Bruising has just came up in the last couple of days. Nurse Assessment Nurse: Arthor Captain, RN, Margaret Date/Time (Eastern Time): 03/16/2017 2:45:28 PM Confirm and document reason for call. If symptomatic, describe symptoms. ---Caller states that Thursday she noticed some bruising on her right inner thigh. She has pulled some tiny ticks off of her in the last month. Wondered if the bruising is related to them. Denies fever. Bruises do not hurt. No injury. Does the patient have any new or worsening symptoms? ---Yes Will a triage be completed? ---Yes Related visit to physician within the last 2 weeks? ---No Does the PT have any chronic conditions? (i.e. diabetes, asthma, etc.) ---No Is this a behavioral health or substance abuse call? ---No Guidelines Guideline Title Affirmed Question Affirmed Notes Bruises [1] Not caused by an injury AND [2] 5 or more bruises now Final Disposition User See Physician within 4 Hours (or PCP triage) Cockrum, RN, Joycelyn Schmid Comments Patient refuses outcome time frame due to pet dying at vet office and she has to be there by 4 pm. Would like a call back for appointment. Referrals REFERRED TO PCP OFFICE Disagree/Comply: Disagree Disagree/Comply Reason: Wait and see

## 2017-03-16 NOTE — Telephone Encounter (Signed)
I spoke with pt and she is having issues with her dog so pt has not called back to schedule appt; pt wanted to schedule appt on 03/17/17.bruise on rt leg since last week. No CP or SOB or red streaks on leg. Pt scheduled 03/17/17 at 8:30. If pt condition changes or worsens tonight pt will go to [facility] or ED.FYI to Dr Diona Browner.

## 2017-03-17 ENCOUNTER — Ambulatory Visit (INDEPENDENT_AMBULATORY_CARE_PROVIDER_SITE_OTHER): Payer: Medicare Other | Admitting: Family Medicine

## 2017-03-17 ENCOUNTER — Encounter: Payer: Self-pay | Admitting: Family Medicine

## 2017-03-17 DIAGNOSIS — W57XXXA Bitten or stung by nonvenomous insect and other nonvenomous arthropods, initial encounter: Secondary | ICD-10-CM | POA: Diagnosis not present

## 2017-03-17 DIAGNOSIS — S70361A Insect bite (nonvenomous), right thigh, initial encounter: Secondary | ICD-10-CM | POA: Diagnosis not present

## 2017-03-17 DIAGNOSIS — R238 Other skin changes: Secondary | ICD-10-CM

## 2017-03-17 DIAGNOSIS — R233 Spontaneous ecchymoses: Secondary | ICD-10-CM | POA: Insufficient documentation

## 2017-03-17 LAB — CBC WITH DIFFERENTIAL/PLATELET
Basophils Absolute: 0 10*3/uL (ref 0.0–0.1)
Basophils Relative: 0.8 % (ref 0.0–3.0)
Eosinophils Absolute: 0.1 10*3/uL (ref 0.0–0.7)
Eosinophils Relative: 2.3 % (ref 0.0–5.0)
HCT: 34.1 % — ABNORMAL LOW (ref 36.0–46.0)
Hemoglobin: 11.4 g/dL — ABNORMAL LOW (ref 12.0–15.0)
Lymphocytes Relative: 13.9 % (ref 12.0–46.0)
Lymphs Abs: 0.8 10*3/uL (ref 0.7–4.0)
MCHC: 33.4 g/dL (ref 30.0–36.0)
MCV: 98.4 fl (ref 78.0–100.0)
Monocytes Absolute: 0.5 10*3/uL (ref 0.1–1.0)
Monocytes Relative: 8.8 % (ref 3.0–12.0)
Neutro Abs: 4.4 10*3/uL (ref 1.4–7.7)
Neutrophils Relative %: 74.2 % (ref 43.0–77.0)
Platelets: 189 10*3/uL (ref 150.0–400.0)
RBC: 3.46 Mil/uL — ABNORMAL LOW (ref 3.87–5.11)
RDW: 12.6 % (ref 11.5–15.5)
WBC: 5.9 10*3/uL (ref 4.0–10.5)

## 2017-03-17 LAB — COMPREHENSIVE METABOLIC PANEL
ALT: 20 U/L (ref 0–35)
AST: 20 U/L (ref 0–37)
Albumin: 4.2 g/dL (ref 3.5–5.2)
Alkaline Phosphatase: 45 U/L (ref 39–117)
BUN: 34 mg/dL — ABNORMAL HIGH (ref 6–23)
CO2: 29 mEq/L (ref 19–32)
Calcium: 9.9 mg/dL (ref 8.4–10.5)
Chloride: 108 mEq/L (ref 96–112)
Creatinine, Ser: 1.28 mg/dL — ABNORMAL HIGH (ref 0.40–1.20)
GFR: 43.21 mL/min — ABNORMAL LOW (ref >60.00)
Glucose, Bld: 88 mg/dL (ref 70–99)
Potassium: 4.5 mEq/L (ref 3.5–5.1)
Sodium: 141 mEq/L (ref 135–145)
Total Bilirubin: 0.6 mg/dL (ref 0.2–1.2)
Total Protein: 7.1 g/dL (ref 6.0–8.3)

## 2017-03-17 NOTE — Progress Notes (Signed)
Subjective:    Patient ID: Carla Lyons, female    DOB: 04/20/1941, 76 y.o.   MRN: [id number]  HPI    76 year old female with  Hx of CKD, anemia, hypothyroidism, HTN presents with new onset  bruising on right upper inner thigh. Noted 1 week ago.  Non tender. No known injury.   She has removed several different tick in April, from  Right knee.  No associated rash. Feels well overall.  No new joint pain, no headache, no neck pain, no fever, no neuro symptoms.  No gum bleedng, no epistaxsis, no hematuria, no blood in stool.  On  aspirin and fish oil.  No new meds.  She does havre multiple bruising on legs and arms she is not worried about.    She has iron def anemia.. No clear bleed.. On iron.  Review of Systems  Constitutional: Negative for fatigue and fever.  HENT: Negative for congestion.   Eyes: Negative for pain.  Respiratory: Negative for cough and shortness of breath.   Cardiovascular: Negative for chest pain, palpitations and leg swelling.  Gastrointestinal: Negative for abdominal pain.  Genitourinary: Negative for dysuria and vaginal bleeding.  Musculoskeletal: Negative for back pain.  Neurological: Negative for syncope, light-headedness and headaches.  Psychiatric/Behavioral: Negative for dysphoric mood.       Objective:   Physical Exam  Constitutional: She is oriented to person, place, and time. Vital signs are normal. She appears well-developed and well-nourished. She is cooperative.  Non-toxic appearance. She does not appear ill. No distress.  HENT:  Head: Normocephalic.  Right Ear: Hearing, tympanic membrane, external ear and ear canal normal. Tympanic membrane is not erythematous, not retracted and not bulging.  Left Ear: Hearing, tympanic membrane, external ear and ear canal normal. Tympanic membrane is not erythematous, not retracted and not bulging.  Nose: No mucosal edema or rhinorrhea. Right sinus exhibits no maxillary sinus tenderness and no frontal sinus  tenderness. Left sinus exhibits no maxillary sinus tenderness and no frontal sinus tenderness.  Mouth/Throat: Uvula is midline, oropharynx is clear and moist and mucous membranes are normal.  Eyes: Conjunctivae, EOM and lids are normal. Pupils are equal, round, and reactive to light. Lids are everted and swept, no foreign bodies found.  Neck: Trachea normal and normal range of motion. Neck supple. Carotid bruit is not present. No thyroid mass and no thyromegaly present.  Cardiovascular: Normal rate, regular rhythm, S1 normal, S2 normal, normal heart sounds, intact distal pulses and normal pulses.  Exam reveals no gallop and no friction rub.   No murmur heard. Pulmonary/Chest: Effort normal and breath sounds normal. No tachypnea. No respiratory distress. She has no decreased breath sounds. She has no wheezes. She has no rhonchi. She has no rales.  Abdominal: Soft. Normal appearance and bowel sounds are normal. There is no tenderness.  Neurological: She is alert and oriented to person, place, and time. She has normal strength and normal reflexes. No cranial nerve deficit or sensory deficit. She exhibits normal muscle tone. She displays a negative Romberg sign. Coordination and gait normal. GCS eye subscore is 4. GCS verbal subscore is 5. GCS motor subscore is 6.  Nml cerebellar exam   No papilledema  Skin: Skin is warm, dry and intact. No rash noted.  Multiple bruises on right upper thigh as well as other on arms and legs.  Psychiatric: She has a normal mood and affect. Her speech is normal and behavior is normal. Judgment and thought content normal. Her mood  appears not anxious. Cognition and memory are normal. Cognition and memory are not impaired. She does not exhibit a depressed mood. She exhibits normal recent memory and normal remote memory.          Assessment & Plan:

## 2017-03-17 NOTE — Assessment & Plan Note (Signed)
Possibly just senile purpura in setting of asa and fish oil but given increase will eval for cause.

## 2017-03-17 NOTE — Assessment & Plan Note (Signed)
No clear current symptoms, but with easy bruising and multiple bites.. Will eval with tick borne labs. Pretest probability low.. No clear indication for emperic antibiotics.

## 2017-03-17 NOTE — Patient Instructions (Signed)
Please stop at the lab to set up to have labs drawn.

## 2017-03-18 LAB — ROCKY MTN SPOTTED FVR ABS PNL(IGG+IGM)
RMSF IgG: NOT DETECTED
RMSF IgM: NOT DETECTED

## 2017-03-18 LAB — EHRLICHIA ANTIBODY PANEL
E chaffeensis (HGE) Ab, IgG: <1:64 {titer}
E chaffeensis (HGE) Ab, IgM: <1:20 {titer}

## 2017-03-18 LAB — LYME AB/WESTERN BLOT REFLEX: B burgdorferi Ab IgG+IgM: <0.9 Index (ref <0.90)

## 2017-04-02 ENCOUNTER — Other Ambulatory Visit (INDEPENDENT_AMBULATORY_CARE_PROVIDER_SITE_OTHER): Payer: Medicare Other

## 2017-04-02 DIAGNOSIS — E782 Mixed hyperlipidemia: Secondary | ICD-10-CM

## 2017-04-02 DIAGNOSIS — E038 Other specified hypothyroidism: Secondary | ICD-10-CM | POA: Diagnosis not present

## 2017-04-02 LAB — LIPID PANEL
Cholesterol: 155 mg/dL (ref 0–200)
HDL: 47.8 mg/dL (ref >39.00)
LDL Cholesterol: 84 mg/dL (ref 0–99)
NonHDL: 106.75
Total CHOL/HDL Ratio: 3
Triglycerides: 115 mg/dL (ref 0.0–149.0)
VLDL: 23 mg/dL (ref 0.0–40.0)

## 2017-04-02 LAB — T3, FREE: T3, Free: 3.1 pg/mL (ref 2.3–4.2)

## 2017-04-02 LAB — T4, FREE: Free T4: 1.25 ng/dL (ref 0.60–1.60)

## 2017-04-02 LAB — TSH: TSH: 0.52 u[IU]/mL (ref 0.35–4.50)

## 2017-06-11 ENCOUNTER — Other Ambulatory Visit: Payer: Self-pay

## 2017-06-11 MED ORDER — ALPRAZOLAM 0.25 MG PO TABS: 0.2500 mg | ORAL_TABLET | Freq: Every day | ORAL | 0 refills | Status: DC

## 2017-06-11 NOTE — Telephone Encounter (Signed)
Pt left v/m; while living in [place] with pts mom pt had gotten xanax refilled by doctor in [place] but pts mom has deceased and pt said she had spoken with Dr Deborra Medina about getting xanax refilled when needed. Pt request refill Xanax to [facility].Please advise. Pt last seen f/u 03/05/17.

## 2017-06-11 NOTE — Telephone Encounter (Signed)
Called ion to [place], [place]: [phone]

## 2017-06-16 ENCOUNTER — Encounter: Payer: Self-pay | Admitting: Family Medicine

## 2017-06-26 NOTE — Telephone Encounter (Signed)
error 

## 2017-07-24 ENCOUNTER — Ambulatory Visit (INDEPENDENT_AMBULATORY_CARE_PROVIDER_SITE_OTHER): Payer: Medicare Other | Admitting: Family Medicine

## 2017-07-24 ENCOUNTER — Encounter: Payer: Self-pay | Admitting: Family Medicine

## 2017-07-24 VITALS — BP 130/78 | HR 56 | Temp 97.4°F | Ht 62.0 in | Wt 149.2 lb

## 2017-07-24 DIAGNOSIS — E785 Hyperlipidemia, unspecified: Secondary | ICD-10-CM

## 2017-07-24 DIAGNOSIS — G4700 Insomnia, unspecified: Secondary | ICD-10-CM | POA: Diagnosis not present

## 2017-07-24 DIAGNOSIS — M89319 Hypertrophy of bone, unspecified shoulder: Secondary | ICD-10-CM | POA: Diagnosis not present

## 2017-07-24 DIAGNOSIS — N183 Chronic kidney disease, stage 3 unspecified: Secondary | ICD-10-CM

## 2017-07-24 DIAGNOSIS — I10 Essential (primary) hypertension: Secondary | ICD-10-CM

## 2017-07-24 DIAGNOSIS — Z836 Family history of other diseases of the respiratory system: Secondary | ICD-10-CM | POA: Diagnosis not present

## 2017-07-24 MED ORDER — ALPRAZOLAM 0.25 MG PO TABS: 0.2500 mg | ORAL_TABLET | Freq: Every day | ORAL | 2 refills | Status: DC

## 2017-07-24 MED ORDER — ATORVASTATIN CALCIUM 20 MG PO TABS
20.0000 mg | ORAL_TABLET | Freq: Every day | ORAL | 3 refills | Status: DC
Start: 2017-07-24 — End: 2018-07-22

## 2017-07-24 MED ORDER — BENAZEPRIL HCL 10 MG PO TABS: 10.0000 mg | ORAL_TABLET | Freq: Every day | ORAL | 3 refills | Status: DC

## 2017-07-24 NOTE — Patient Instructions (Addendum)
It was a pleasure to meet you today! I look forward to partnering with you for your health care needs  I will see you in the spring for your annual exam

## 2017-07-24 NOTE — Progress Notes (Signed)
Subjective:    Patient ID: Carla Lyons, female    DOB: 02/13/1941, 76 y.o.   MRN: [id number]  HPI This is a 76 yo female who presents today to establish care, is transferring from Dr. Deborra Medina. She lives alone, she enjoys crafting, doing things with her church. Works out in her yard. Has a treadmill that she uses more in the winter.   Has a knot on side of neck, not sure if it has always been there. No pain or redness. A friend noticed that she has a swelling on right side of her neck. She can feel a that the area is different from the left side, but it has not caused her any trouble.   Has concern about pulmonary fibrosis. Three siblings with, two have died. She has not had any cough or shortness of breath.   Has chronic insomnia and takes alprazolam 0.25 several times a week for sleep. Does not need every night.    Past Medical History:  Diagnosis Date  . Allergy   . Colon polyps 06-15-07  . Hypertension   . Osteoporosis   . Personal history of colonic adenomas 07/19/2007  . Renal insufficiency   . Thyroid disease    Past Surgical History:  Procedure Laterality Date  . APPENDECTOMY    . CESAREAN SECTION     x 2  . PARTIAL HYSTERECTOMY  age 49  . VAGINAL HYSTERECTOMY  age 63   partial   Family History  Problem Relation Age of Onset  . Coronary artery disease Mother   . Other Mother        bypass surgery  . Osteoarthritis Father   . Heart attack Maternal Grandfather   . Lung disease Brother   . Hypertension Brother   . Colon cancer Neg Hx   . Esophageal cancer Neg Hx   . Rectal cancer Neg Hx   . Stomach cancer Neg Hx    Social History  Substance Use Topics  . Smoking status: Former Smoker    Quit date: 10/07/2001  . Smokeless tobacco: Never Used  . Alcohol use Yes     Comment: rare      Review of Systems Per HPI    Objective:   Physical Exam  Constitutional: She is oriented to person, place, and time. She appears well-developed and well-nourished. No distress.    HENT:  Head: Normocephalic and atraumatic.  Eyes: Conjunctivae are normal.  Neck: Normal range of motion. Neck supple.    Cardiovascular: Normal rate, regular rhythm and normal heart sounds.   Pulmonary/Chest: Effort normal and breath sounds normal.  Neurological: She is alert and oriented to person, place, and time.  Skin: Skin is warm and dry. She is not diaphoretic.  Psychiatric: She has a normal mood and affect. Her behavior is normal. Judgment and thought content normal.  Vitals reviewed.     BP 130/78 (BP Location: Left Arm, Patient Position: Sitting, Cuff Size: Normal)   Pulse (!) 56   Temp (!) 97.4 F (36.3 C) (Oral)   Ht 5\' 2"  (1.575 m)   Wt 149 lb 4 oz (67.7 kg)   SpO2 98%   BMI 27.30 kg/m  Wt Readings from Last 3 Encounters:  07/24/17 149 lb 4 oz (67.7 kg)  03/17/17 147 lb 4 oz (66.8 kg)  02/26/17 148 lb 8 oz (67.4 kg)       Assessment & Plan:  1. CKD (chronic kidney disease), stage III (HCC) - benazepril (LOTENSIN) 10 MG tablet;  Take 1 tablet (10 mg total) by mouth daily.  Dispense: 90 tablet; Refill: 3  2. Essential hypertension - benazepril (LOTENSIN) 10 MG tablet; Take 1 tablet (10 mg total) by mouth daily.  Dispense: 90 tablet; Refill: 3  3. Insomnia, unspecified type - ALPRAZolam (XANAX) 0.25 MG tablet; Take 1 tablet (0.25 mg total) by mouth at bedtime.  Dispense: 30 tablet; Refill: 2  4. Hyperlipidemia, unspecified hyperlipidemia type - atorvastatin (LIPITOR) 20 MG tablet; Take 1 tablet (20 mg total) by mouth daily.  Dispense: 90 tablet; Refill: 3  5. Clavicle enlargement - offered xray, she declined, did not see any mention on CXR or Neck angiogram from 2012.  - if any symptoms, change, she will return for xray  6. Family history of pulmonary fibrosis - discussed s/s and she knows to report any DOE, cough that lasts more than 1 week  - follow up in 6 months for AWV, CPE Clarene Reamer, FNP-BC  [facility], [facility]  07/24/2017 10:13 AM

## 2017-09-07 ENCOUNTER — Ambulatory Visit (INDEPENDENT_AMBULATORY_CARE_PROVIDER_SITE_OTHER): Payer: Medicare Other | Admitting: Family Medicine

## 2017-09-07 ENCOUNTER — Encounter: Payer: Self-pay | Admitting: Family Medicine

## 2017-09-07 VITALS — BP 128/76 | HR 58 | Temp 98.1°F | Ht 62.0 in | Wt 154.4 lb

## 2017-09-07 DIAGNOSIS — K130 Diseases of lips: Secondary | ICD-10-CM | POA: Diagnosis not present

## 2017-09-07 DIAGNOSIS — G8929 Other chronic pain: Secondary | ICD-10-CM

## 2017-09-07 DIAGNOSIS — S161XXA Strain of muscle, fascia and tendon at neck level, initial encounter: Secondary | ICD-10-CM | POA: Diagnosis not present

## 2017-09-07 DIAGNOSIS — M545 Low back pain: Secondary | ICD-10-CM | POA: Diagnosis not present

## 2017-09-07 MED ORDER — PREDNISONE 20 MG PO TABS: 20.0000 mg | ORAL_TABLET | Freq: Every day | ORAL | 0 refills | Status: DC

## 2017-09-07 NOTE — Patient Instructions (Signed)
Instead of benadryl, get some generic Zyrtec- cetirizine  Back Exercises If you have pain in your back, do these exercises 2-3 times each day or as told by your doctor. When the pain goes away, do the exercises once each day, but repeat the steps more times for each exercise (do more repetitions). If you do not have pain in your back, do these exercises once each day or as told by your doctor. Exercises Single Knee to Chest  Do these steps 3-5 times in a row for each leg: 1. Lie on your back on a firm bed or the floor with your legs stretched out. 2. Bring one knee to your chest. 3. Hold your knee to your chest by grabbing your knee or thigh. 4. Pull on your knee until you feel a gentle stretch in your lower back. 5. Keep doing the stretch for 10-30 seconds. 6. Slowly let go of your leg and straighten it.  Pelvic Tilt  Do these steps 5-10 times in a row: 1. Lie on your back on a firm bed or the floor with your legs stretched out. 2. Bend your knees so they point up to the ceiling. Your feet should be flat on the floor. 3. Tighten your lower belly (abdomen) muscles to press your lower back against the floor. This will make your tailbone point up to the ceiling instead of pointing down to your feet or the floor. 4. Stay in this position for 5-10 seconds while you gently tighten your muscles and breathe evenly.  Cat-Cow  Do these steps until your lower back bends more easily: 1. Get on your hands and knees on a firm surface. Keep your hands under your shoulders, and keep your knees under your hips. You may put padding under your knees. 2. Let your head hang down, and make your tailbone point down to the floor so your lower back is round like the back of a cat. 3. Stay in this position for 5 seconds. 4. Slowly lift your head and make your tailbone point up to the ceiling so your back hangs low (sags) like the back of a cow. 5. Stay in this position for 5 seconds.  Press-Ups  Do these  steps 5-10 times in a row: 1. Lie on your belly (face-down) on the floor. 2. Place your hands near your head, about shoulder-width apart. 3. While you keep your back relaxed and keep your hips on the floor, slowly straighten your arms to raise the top half of your body and lift your shoulders. Do not use your back muscles. To make yourself more comfortable, you may change where you place your hands. 4. Stay in this position for 5 seconds. 5. Slowly return to lying flat on the floor.  Bridges  Do these steps 10 times in a row: 1. Lie on your back on a firm surface. 2. Bend your knees so they point up to the ceiling. Your feet should be flat on the floor. 3. Tighten your butt muscles and lift your butt off of the floor until your waist is almost as high as your knees. If you do not feel the muscles working in your butt and the back of your thighs, slide your feet 1-2 inches farther away from your butt. 4. Stay in this position for 3-5 seconds. 5. Slowly lower your butt to the floor, and let your butt muscles relax.  If this exercise is too easy, try doing it with your arms crossed over your chest.  Belly Crunches  Do these steps 5-10 times in a row: 1. Lie on your back on a firm bed or the floor with your legs stretched out. 2. Bend your knees so they point up to the ceiling. Your feet should be flat on the floor. 3. Cross your arms over your chest. 4. Tip your chin a little bit toward your chest but do not bend your neck. 5. Tighten your belly muscles and slowly raise your chest just enough to lift your shoulder blades a tiny bit off of the floor. 6. Slowly lower your chest and your head to the floor.  Back Lifts Do these steps 5-10 times in a row: 1. Lie on your belly (face-down) with your arms at your sides, and rest your forehead on the floor. 2. Tighten the muscles in your legs and your butt. 3. Slowly lift your chest off of the floor while you keep your hips on the floor. Keep the  back of your head in line with the curve in your back. Look at the floor while you do this. 4. Stay in this position for 3-5 seconds. 5. Slowly lower your chest and your face to the floor.  Contact a doctor if:  Your back pain gets a lot worse when you do an exercise.  Your back pain does not lessen 2 hours after you exercise. If you have any of these problems, stop doing the exercises. Do not do them again unless your doctor says it is okay. Get help right away if:  You have sudden, very bad back pain. If this happens, stop doing the exercises. Do not do them again unless your doctor says it is okay. This information is not intended to replace advice given to you by your health care provider. Make sure you discuss any questions you have with your health care provider. Document Released: 10/25/2010 Document Revised: 02/28/2016 Document Reviewed: 11/16/2014 Elsevier Interactive Patient Education  2018 [employer].   Neck Exercises Neck exercises can be important for many reasons:  They can help you to improve and maintain flexibility in your neck. This can be especially important as you age.  They can help to make your neck stronger. This can make movement easier.  They can reduce or prevent neck pain.  They may help your upper back.  Ask your health care provider which neck exercises would be best for you. Exercises Neck Press Repeat this exercise 10 times. Do it first thing in the morning and right before bed or as told by your health care provider. 1. Lie on your back on a firm bed or on the floor with a pillow under your head. 2. Use your neck muscles to push your head down on the pillow and straighten your spine. 3. Hold the position as well as you can. Keep your head facing up and your chin tucked. 4. Slowly count to 5 while holding this position. 5. Relax for a few seconds. Then repeat.  Isometric Strengthening Do a full set of these exercises 2 times a day or as told  by your health care provider. 1. Sit in a supportive chair and place your hand on your forehead. 2. Push forward with your head and neck while pushing back with your hand. Hold for 10 seconds. 3. Relax. Then repeat the exercise 3 times. 4. Next, do thesequence again, this time putting your hand against the back of your head. Use your head and neck to push backward against the hand pressure. 5.  Finally, do the same exercise on either side of your head, pushing sideways against the pressure of your hand.  Prone Head Lifts Repeat this exercise 5 times. Do this 2 times a day or as told by your health care provider. 1. Lie face-down, resting on your elbows so that your chest and upper back are raised. 2. Start with your head facing downward, near your chest. Position your chin either on or near your chest. 3. Slowly lift your head upward. Lift until you are looking straight ahead. Then continue lifting your head as far back as you can stretch. 4. Hold your head up for 5 seconds. Then slowly lower it to your starting position.  Supine Head Lifts Repeat this exercise 8-10 times. Do this 2 times a day or as told by your health care provider. 1. Lie on your back, bending your knees to point to the ceiling and keeping your feet flat on the floor. 2. Lift your head slowly off the floor, raising your chin toward your chest. 3. Hold for 5 seconds. 4. Relax and repeat.  Scapular Retraction Repeat this exercise 5 times. Do this 2 times a day or as told by your health care provider. 1. Stand with your arms at your sides. Look straight ahead. 2. Slowly pull both shoulders backward and downward until you feel a stretch between your shoulder blades in your upper back. 3. Hold for 10-30 seconds. 4. Relax and repeat.  Contact a health care provider if:  Your neck pain or discomfort gets much worse when you do an exercise.  Your neck pain or discomfort does not improve within 2 hours after you  exercise. If you have any of these problems, stop exercising right away. Do not do the exercises again unless your health care provider says that you can. Get help right away if:  You develop sudden, severe neck pain. If this happens, stop exercising right away. Do not do the exercises again unless your health care provider says that you can. Exercises Neck Stretch  Repeat this exercise 3-5 times. 1. Do this exercise while standing or while sitting in a chair. 2. Place your feet flat on the floor, shoulder-width apart. 3. Slowly turn your head to the right. Turn it all the way to the right so you can look over your right shoulder. Do not tilt or tip your head. 4. Hold this position for 10-30 seconds. 5. Slowly turn your head to the left, to look over your left shoulder. 6. Hold this position for 10-30 seconds.  Neck Retraction Repeat this exercise 8-10 times. Do this 3-4 times a day or as told by your health care provider. 1. Do this exercise while standing or while sitting in a sturdy chair. 2. Look straight ahead. Do not bend your neck. 3. Use your fingers to push your chin backward. Do not bend your neck for this movement. Continue to face straight ahead. If you are doing the exercise properly, you will feel a slight sensation in your throat and a stretch at the back of your neck. 4. Hold the stretch for 1-2 seconds. Relax and repeat.  This information is not intended to replace advice given to you by your health care provider. Make sure you discuss any questions you have with your health care provider. Document Released: 09/03/2015 Document Revised: 02/28/2016 Document Reviewed: 04/02/2015 Elsevier Interactive Patient Education  2017 [employer].

## 2017-09-07 NOTE — Progress Notes (Signed)
Subjective:    Patient ID: Carla Lyons, female    DOB: 10/20/1940, 76 y.o.   MRN: [id number]  HPI This is a 76 yo female who presents today with lower back and neck pain for about a week. Feels achy, stiff.  No numbness, tingling, weakness, bowel/bladder changes.  Has not felt like exercising like she usually does, has been feeling more down with coming of cold weather and holidays.  Has a treadmill in her home but has not been using recently.  Holidays were hard for her since her sister passed away.  Has sensation that something bit her right lower lip today.  She noticed a small red spot, no swelling, feels a little tender.  She took some Benadryl earlier  Past Medical History:  Diagnosis Date  . Allergy   . Colon polyps 06-15-07  . Hypertension   . Osteoporosis   . Personal history of colonic adenomas 07/19/2007  . Renal insufficiency   . Thyroid disease    Past Surgical History:  Procedure Laterality Date  . APPENDECTOMY    . CESAREAN SECTION     x 2  . PARTIAL HYSTERECTOMY  age 10  . VAGINAL HYSTERECTOMY  age 76   partial   Family History  Problem Relation Age of Onset  . Coronary artery disease Mother   . Other Mother        bypass surgery  . Osteoarthritis Father   . Heart attack Maternal Grandfather   . Lung disease Brother   . Hypertension Brother   . Colon cancer Neg Hx   . Esophageal cancer Neg Hx   . Rectal cancer Neg Hx   . Stomach cancer Neg Hx    Social History   Tobacco Use  . Smoking status: Former Smoker    Last attempt to quit: 10/07/2001    Years since quitting: 15.9  . Smokeless tobacco: Never Used  Substance Use Topics  . Alcohol use: Yes    Comment: rare  . Drug use: No      Review of Systems Per HPI    Objective:   Physical Exam  Constitutional: She is oriented to person, place, and time. She appears well-developed and well-nourished. No distress.  HENT:  Head: Normocephalic and atraumatic.  Mouth/Throat:    Eyes: Conjunctivae  are normal.  Neck: Muscular tenderness (bilateral trapezius) present. No spinous process tenderness present. No neck rigidity. Decreased range of motion (rotation left and right) present. No edema and no erythema present.  Cardiovascular: Normal rate.  Pulmonary/Chest: Effort normal.  Musculoskeletal: She exhibits no edema.       Lumbar back: She exhibits no bony tenderness. Tenderness: paraspinal muscles.  Neurological: She is alert and oriented to person, place, and time.  Skin: Skin is warm and dry. She is not diaphoretic.  Psychiatric: She has a normal mood and affect. Her behavior is normal. Judgment and thought content normal.  Tearful at times.  Vitals reviewed.     BP 128/76   Pulse (!) 58   Temp 98.1 F (36.7 C) (Oral)   Ht 5\' 2"  (1.575 m)   Wt 154 lb 6.4 oz (70 kg)   SpO2 98%   BMI 28.24 kg/m  Wt Readings from Last 3 Encounters:  09/07/17 154 lb 6.4 oz (70 kg)  07/24/17 149 lb 4 oz (67.7 kg)  03/17/17 147 lb 4 oz (66.8 kg)       Assessment & Plan:  1. Acute strain of neck muscle, initial encounter -She  is unable to take NSAIDs due to CKD, will do short course of prednisone, provided her instructions for some neck exercises - Return to clinic precautions reviewed - predniSONE (DELTASONE) 20 MG tablet; Take 1 tablet (20 mg total) by mouth daily with breakfast.  Dispense: 5 tablet; Refill: 0  2. Chronic bilateral low back pain without sciatica See #1 -Provided written back exercises  3. Lip lesion -Unsure what caused her sudden onset of pain and erythema, suggested that she take cetirizine instead of Benadryl if needed for itching/swelling -Follow-up if it does not resolve spontaneously   Clarene Reamer, FNP-BC  [facility] at [facility], [place]  09/09/2017 4:58 PM

## 2018-01-08 HISTORY — PX: CATARACT EXTRACTION W/ INTRAOCULAR LENS IMPLANT: SHX1309

## 2018-01-19 ENCOUNTER — Encounter: Payer: Self-pay | Admitting: Family Medicine

## 2018-01-20 ENCOUNTER — Other Ambulatory Visit: Payer: Self-pay | Admitting: Family Medicine

## 2018-01-20 NOTE — Telephone Encounter (Signed)
DG-Plz see refill req/thx dmf 

## 2018-01-22 ENCOUNTER — Encounter: Payer: Self-pay | Admitting: Family Medicine

## 2018-01-22 LAB — HM MAMMOGRAPHY

## 2018-02-05 ENCOUNTER — Encounter: Payer: Self-pay | Admitting: Family Medicine

## 2018-02-05 HISTORY — PX: CATARACT EXTRACTION W/ INTRAOCULAR LENS IMPLANT: SHX1309

## 2018-02-19 ENCOUNTER — Other Ambulatory Visit: Payer: Self-pay | Admitting: Family Medicine

## 2018-02-19 DIAGNOSIS — G4700 Insomnia, unspecified: Secondary | ICD-10-CM

## 2018-02-19 MED ORDER — ALPRAZOLAM 0.25 MG PO TABS: 0.2500 mg | ORAL_TABLET | Freq: Every day | ORAL | 2 refills | Status: DC

## 2018-03-02 ENCOUNTER — Ambulatory Visit: Payer: Medicare Other | Admitting: Internal Medicine

## 2018-03-02 ENCOUNTER — Ambulatory Visit: Payer: Self-pay | Admitting: *Deleted

## 2018-03-02 ENCOUNTER — Encounter: Payer: Self-pay | Admitting: Emergency Medicine

## 2018-03-02 ENCOUNTER — Emergency Department
Admission: EM | Admit: 2018-03-02 | Discharge: 2018-03-02 | Disposition: A | Discharge status: Home / Self Care | Payer: Medicare Other | Attending: Student in an Organized Health Care Education/Training Program | Admitting: Student in an Organized Health Care Education/Training Program

## 2018-03-02 DIAGNOSIS — N183 Chronic kidney disease, stage 3 (moderate): Secondary | ICD-10-CM | POA: Insufficient documentation

## 2018-03-02 DIAGNOSIS — I129 Hypertensive chronic kidney disease with stage 1 through stage 4 chronic kidney disease, or unspecified chronic kidney disease: Secondary | ICD-10-CM | POA: Diagnosis not present

## 2018-03-02 DIAGNOSIS — Z87891 Personal history of nicotine dependence: Secondary | ICD-10-CM | POA: Diagnosis not present

## 2018-03-02 DIAGNOSIS — Z79899 Other long term (current) drug therapy: Secondary | ICD-10-CM | POA: Insufficient documentation

## 2018-03-02 DIAGNOSIS — E860 Dehydration: Secondary | ICD-10-CM | POA: Insufficient documentation

## 2018-03-02 DIAGNOSIS — R42 Dizziness and giddiness: Secondary | ICD-10-CM | POA: Insufficient documentation

## 2018-03-02 LAB — URINALYSIS, COMPLETE (UACMP) WITH MICROSCOPIC
Glucose, UA: NEGATIVE mg/dL
Hgb urine dipstick: NEGATIVE
Leukocytes, UA: NEGATIVE
Nitrite: NEGATIVE
Protein, ur: 100 mg/dL — AB
RBC / HPF: NONE SEEN RBC/hpf (ref 0–5)
Specific Gravity, Urine: >1.03 — ABNORMAL HIGH (ref 1.005–1.030)
pH: 5.5 (ref 5.0–8.0)

## 2018-03-02 LAB — BASIC METABOLIC PANEL
Anion gap: 10 (ref 5–15)
BUN: 32 mg/dL — ABNORMAL HIGH (ref 6–20)
CO2: 21 mmol/L — ABNORMAL LOW (ref 22–32)
Calcium: 9.7 mg/dL (ref 8.9–10.3)
Chloride: 107 mmol/L (ref 101–111)
Creatinine, Ser: 1.43 mg/dL — ABNORMAL HIGH (ref 0.44–1.00)
GFR calc Af Amer: 40 mL/min — ABNORMAL LOW (ref >60)
GFR calc non Af Amer: 35 mL/min — ABNORMAL LOW (ref >60)
Glucose, Bld: 133 mg/dL — ABNORMAL HIGH (ref 65–99)
Potassium: 4.7 mmol/L (ref 3.5–5.1)
Sodium: 138 mmol/L (ref 135–145)

## 2018-03-02 LAB — CBC
HCT: 34 % — ABNORMAL LOW (ref 35.0–47.0)
Hemoglobin: 11.4 g/dL — ABNORMAL LOW (ref 12.0–16.0)
MCH: 33.2 pg (ref 26.0–34.0)
MCHC: 33.6 g/dL (ref 32.0–36.0)
MCV: 98.9 fL (ref 80.0–100.0)
Platelets: 220 10*3/uL (ref 150–440)
RBC: 3.43 MIL/uL — ABNORMAL LOW (ref 3.80–5.20)
RDW: 13.5 % (ref 11.5–14.5)
WBC: 11.5 10*3/uL — ABNORMAL HIGH (ref 3.6–11.0)

## 2018-03-02 MED ORDER — SODIUM CHLORIDE 0.9 % IV BOLUS
1000.0000 mL | Freq: Once | INTRAVENOUS | Status: AC
  Administered 2018-03-02: 1000 mL via INTRAVENOUS

## 2018-03-02 NOTE — Telephone Encounter (Addendum)
Pt called with complaints of being light headed, dry mouth, and when she stands up she felt like she is going to pass out; she says that she feels dehydrated; the pt states that she finished cataract surgery and her last appointment was 02/24/18; she also says that she has a "blinking sensation" in both eyes; recommendations made per protocol to include seeing a physician within 24 hours; pt scheduled an appointment with Webb Silversmith, per [place], today at 1400; she verbalizes understanding; will route to office for notification of this upcoming appointment.  Reason for Disposition . [1] MODERATE dizziness (e.g., interferes with normal activities) AND [2] has NOT been evaluated by physician for this  (Exception: dizziness caused by heat exposure, sudden standing, or poor fluid intake)  Answer Assessment - Initial Assessment Questions 1. DESCRIPTION: "Describe your dizziness."     lighthead 2. LIGHTHEADED: "Do you feel lightheaded?" (e.g., somewhat faint, woozy, weak upon standing)     Weak upon standing; feels off balance 3. VERTIGO: "Do you feel like either you or the room is spinning or tilting?" (i.e. vertigo)     no 4. SEVERITY: "How bad is it?"  "Do you feel like you are going to faint?" "Can you stand and walk?"   - MILD - walking normally   - MODERATE - interferes with normal activities (e.g., work, school)    - SEVERE - unable to stand, requires support to walk, feels like passing out now.      moderate 5. ONSET:  "When did the dizziness begin?"     02/28/18 6. AGGRAVATING FACTORS: "Does anything make it worse?" (e.g., standing, change in head position)     Going from sitting to standing position 7. HEART RATE: "Can you tell me your heart rate?" "How many beats in 15 seconds?"  (Note: not all patients can do this)       Pt does not know how to do this 8. CAUSE: "What do you think is causing the dizziness?"     Maybe dehydration 9. RECURRENT SYMPTOM: "Have you had dizziness  before?" If so, ask: "When was the last time?" "What happened that time?"     Yes when dehydrated; happened 2-3 years ago  10. OTHER SYMPTOMS: "Do you have any other symptoms?" (e.g., fever, chest pain, vomiting, diarrhea, bleeding)       No appetite, tired 11. PREGNANCY: "Is there any chance you are pregnant?" "When was your last menstrual period?"       no  Protocols used: DIZZINESS [facility]
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
## 2018-03-02 NOTE — ED Triage Notes (Addendum)
Patient states she has felt weak and dizziness with standing since Sunday.  Patient reports running errands in the heat on Saturday and then having 3 episodes of diarrhea on Sunday.  Patient states she has felt badly after that.  Patient reports occasional nausea.

## 2018-03-02 NOTE — ED Notes (Signed)
Pt states still unable to urinate to provide sample.

## 2018-03-02 NOTE — Telephone Encounter (Signed)
Contacted pt based on recommendation from [facility] at [facility]; she relayed message from [facility] that based on the pt's symptoms, she needs to be seen in ED; the pt verbalizes understanding and states that she will have her daughter take her; will route to office for notification of this encounter; will also cancel 1400 appointment

## 2018-03-04 ENCOUNTER — Other Ambulatory Visit: Payer: Self-pay | Admitting: Family Medicine

## 2018-03-04 ENCOUNTER — Ambulatory Visit: Payer: Self-pay

## 2018-03-04 DIAGNOSIS — I10 Essential (primary) hypertension: Secondary | ICD-10-CM

## 2018-03-04 DIAGNOSIS — N183 Chronic kidney disease, stage 3 unspecified: Secondary | ICD-10-CM

## 2018-03-04 DIAGNOSIS — E785 Hyperlipidemia, unspecified: Secondary | ICD-10-CM

## 2018-03-04 DIAGNOSIS — M810 Age-related osteoporosis without current pathological fracture: Secondary | ICD-10-CM

## 2018-03-04 DIAGNOSIS — D631 Anemia in chronic kidney disease: Secondary | ICD-10-CM

## 2018-03-04 DIAGNOSIS — E039 Hypothyroidism, unspecified: Secondary | ICD-10-CM

## 2018-03-04 NOTE — Telephone Encounter (Addendum)
Telephone call from patient with a complaint of a bad cough. It began approx a week ago or early this week.  " thought it was an allergy. Patient states that cough has gotten worse.  State "every time she coughs, her chest tightens". Denies difficulty breathing and fever. Cough is productive and irritating her throat.  Sputum is greenish yellow Denies spitting blood.   Pt. States she has a wellness exam appointment tomorrow. Provided care advice and recommended that patient keep appointment tomorrow. Pt. Voiced understanding.         Reason for Disposition . Chest pain  (Exception: MILD central chest pain, present only when coughing) . SEVERE coughing spells (e.g., whooping sound after coughing, vomiting after coughing)  Answer Assessment - Initial Assessment Questions 1. ONSET: "When did the cough begin?"     " about a week, or early part of the week."    Thought it was an allergery" 2. SEVERITY: "How bad is the cough today?"     ' Has gotten worse \"  Every time I cough my chest feels tight."3. RESPIRATORY DISTRESS: "Describe your breathing."     "Breathing is ok" 4. FEVER: "Do you have a fever?" If so, ask: "What is your temperature, how was it measured, and when did it start?"    " No" 5. SPUTUM: "Describe the color of your sputum" (clear, white, yellow, green)     greenish  yellow 6. HEMOPTYSIS: "Are you coughing up any blood?" If so ask: "How much?" (flecks, streaks, tablespoons, etc.)    no 7. CARDIAC HISTORY: "Do you have any history of heart disease?" (e.g., heart attack, congestive heart failure)      no 8. LUNG HISTORY: "Do you have any history of lung disease?"  (e.g., pulmonary embolus, asthma, emphysema)     no 9. PE RISK FACTORS: "Do you have a history of blood clots?" (or: recent major surgery, recent prolonged travel, bedridden )     *No Answer* 10. OTHER SYMPTOMS: "Do you have any other symptoms?" (e.g., runny nose, wheezing, chest pain)        Chest feels tight when  coughin 11. PREGNANCY: "Is there any chance you are pregnant?" "When was your last menstrual period?"na       no 12. TRAVEL: "Have you traveled out of the country in the last month?" (e.g., travel history, exposures)      no  Protocols used: [place]

## 2018-03-04 NOTE — Telephone Encounter (Signed)
Pt has appt with Lesia LPN on 03/70/48.

## 2018-03-05 ENCOUNTER — Ambulatory Visit: Payer: Medicare Other

## 2018-03-05 ENCOUNTER — Ambulatory Visit (INDEPENDENT_AMBULATORY_CARE_PROVIDER_SITE_OTHER): Payer: Medicare Other

## 2018-03-05 ENCOUNTER — Ambulatory Visit (INDEPENDENT_AMBULATORY_CARE_PROVIDER_SITE_OTHER): Payer: Medicare Other | Admitting: Family Medicine

## 2018-03-05 ENCOUNTER — Encounter: Payer: Self-pay | Admitting: Family Medicine

## 2018-03-05 VITALS — BP 124/80 | HR 65 | Temp 99.1°F | Ht 62.0 in | Wt 158.8 lb

## 2018-03-05 DIAGNOSIS — I10 Essential (primary) hypertension: Secondary | ICD-10-CM | POA: Diagnosis not present

## 2018-03-05 DIAGNOSIS — Z0000 Encounter for general adult medical examination without abnormal findings: Secondary | ICD-10-CM

## 2018-03-05 DIAGNOSIS — J22 Unspecified acute lower respiratory infection: Secondary | ICD-10-CM

## 2018-03-05 DIAGNOSIS — N183 Chronic kidney disease, stage 3 unspecified: Secondary | ICD-10-CM

## 2018-03-05 DIAGNOSIS — E039 Hypothyroidism, unspecified: Secondary | ICD-10-CM | POA: Diagnosis not present

## 2018-03-05 DIAGNOSIS — M810 Age-related osteoporosis without current pathological fracture: Secondary | ICD-10-CM

## 2018-03-05 DIAGNOSIS — E785 Hyperlipidemia, unspecified: Secondary | ICD-10-CM | POA: Diagnosis not present

## 2018-03-05 LAB — COMPREHENSIVE METABOLIC PANEL
ALT: 12 U/L (ref 0–35)
AST: 15 U/L (ref 0–37)
Albumin: 3.8 g/dL (ref 3.5–5.2)
Alkaline Phosphatase: 42 U/L (ref 39–117)
BUN: 26 mg/dL — ABNORMAL HIGH (ref 6–23)
CO2: 26 mEq/L (ref 19–32)
Calcium: 9.6 mg/dL (ref 8.4–10.5)
Chloride: 107 mEq/L (ref 96–112)
Creatinine, Ser: 1.32 mg/dL — ABNORMAL HIGH (ref 0.40–1.20)
GFR: 41.59 mL/min — ABNORMAL LOW (ref >60.00)
Glucose, Bld: 90 mg/dL (ref 70–99)
Potassium: 5 mEq/L (ref 3.5–5.1)
Sodium: 140 mEq/L (ref 135–145)
Total Bilirubin: 0.6 mg/dL (ref 0.2–1.2)
Total Protein: 6.7 g/dL (ref 6.0–8.3)

## 2018-03-05 LAB — LIPID PANEL
Cholesterol: 146 mg/dL (ref 0–200)
HDL: 53.6 mg/dL (ref >39.00)
LDL Cholesterol: 72 mg/dL (ref 0–99)
NonHDL: 92.5
Total CHOL/HDL Ratio: 3
Triglycerides: 105 mg/dL (ref 0.0–149.0)
VLDL: 21 mg/dL (ref 0.0–40.0)

## 2018-03-05 LAB — VITAMIN D 25 HYDROXY (VIT D DEFICIENCY, FRACTURES): VITD: 38.09 ng/mL (ref 30.00–100.00)

## 2018-03-05 LAB — TSH: TSH: 2.03 u[IU]/mL (ref 0.35–4.50)

## 2018-03-05 LAB — MICROALBUMIN / CREATININE URINE RATIO
Creatinine,U: 215.2 mg/dL
Microalb Creat Ratio: 0.8 mg/g (ref 0.0–30.0)
Microalb, Ur: 1.7 mg/dL (ref 0.0–1.9)

## 2018-03-05 MED ORDER — GUAIFENESIN-CODEINE 100-10 MG/5ML PO SYRP: 5.0000 mL | ORAL_SOLUTION | Freq: Three times a day (TID) | ORAL | 0 refills | Status: DC | PRN

## 2018-03-05 MED ORDER — AZITHROMYCIN 250 MG PO TABS: ORAL_TABLET | ORAL | 0 refills | Status: DC

## 2018-03-05 MED ORDER — ALBUTEROL SULFATE (2.5 MG/3ML) 0.083% IN NEBU
2.5000 mg | INHALATION_SOLUTION | Freq: Once | RESPIRATORY_TRACT | Status: AC
  Administered 2018-03-05: 2.5 mg via RESPIRATORY_TRACT

## 2018-03-05 MED ORDER — ALBUTEROL SULFATE HFA 108 (90 BASE) MCG/ACT IN AERS: 2.0000 | INHALATION_SPRAY | RESPIRATORY_TRACT | 1 refills | Status: DC | PRN

## 2018-03-05 NOTE — Progress Notes (Signed)
Subjective:    Patient ID: Carla Lyons, female    DOB: 24-Mar-1941, 77 y.o.   MRN: [id number]  HPI This is a 77 yo female who presents today with cough x 10 days. Thought it started as allergies, no relief with benadryl. Cough got worse 4 days ago. Little sputum production. Pain in back with coughing. No fever/chills. Throat scratchy. No ear pain, some headache across eyes.  A little SOB, no wheeze.  Has taken benadryl, Delsym with some relief.   Past Medical History:  Diagnosis Date  . Allergy   . Colon polyps 06-15-07  . Hypertension   . Osteoporosis   . Personal history of colonic adenomas 07/19/2007  . Renal insufficiency   . Thyroid disease    Past Surgical History:  Procedure Laterality Date  . APPENDECTOMY    . CATARACT EXTRACTION W/ INTRAOCULAR LENS IMPLANT Right 01/08/2018  . CATARACT EXTRACTION W/ INTRAOCULAR LENS IMPLANT Left 02/05/2018  . CESAREAN SECTION     x 2  . PARTIAL HYSTERECTOMY  age 43  . VAGINAL HYSTERECTOMY  age 26   partial   Family History  Problem Relation Age of Onset  . Coronary artery disease Mother   . Other Mother        bypass surgery  . Osteoarthritis Father   . Heart attack Maternal Grandfather   . Lung disease Brother   . Hypertension Brother   . Colon cancer Neg Hx   . Esophageal cancer Neg Hx   . Rectal cancer Neg Hx   . Stomach cancer Neg Hx    Social History   Tobacco Use  . Smoking status: Former Smoker    Last attempt to quit: 10/07/2001    Years since quitting: 16.4  . Smokeless tobacco: Never Used  Substance Use Topics  . Alcohol use: Yes    Comment: rare  . Drug use: No      Review of Systems Per HPI    Objective:   Physical Exam  Constitutional: She is oriented to person, place, and time. She appears well-developed and well-nourished. She appears ill (with frequent, deep cough). No distress.  HENT:  Head: Normocephalic and atraumatic.  Right Ear: External ear normal.  Left Ear: External ear normal.  Nose:  Nose normal.  Mouth/Throat: Oropharynx is clear and moist.  Eyes: Conjunctivae are normal.  Neck: Normal range of motion. Neck supple.  Cardiovascular: Normal rate, regular rhythm and normal heart sounds.  Pulmonary/Chest: Effort normal. No stridor. No respiratory distress. She has no wheezes. She has no rales.  Mildly deceased air movement. Patient given albuterol nebulizer in office with improvement of lung sounds, decreased cough.   Lymphadenopathy:    She has no cervical adenopathy.  Neurological: She is alert and oriented to person, place, and time.  Skin: Skin is warm and dry. She is not diaphoretic.  Psychiatric: She has a normal mood and affect. Her behavior is normal. Judgment and thought content normal.  Vitals reviewed.     BP 118/62 (BP Location: Left Arm, Patient Position: Sitting, Cuff Size: Normal)   Pulse 60   Temp (!) 97.5 F (36.4 C) (Oral)   Resp 16   Ht 5\' 2"  (1.575 m)   Wt 158 lb (71.7 kg)   SpO2 98%   BMI 28.90 kg/m  Wt Readings from Last 3 Encounters:  03/05/18 158 lb (71.7 kg)  03/05/18 158 lb 12 oz (72 kg)  03/02/18 156 lb (70.8 kg)  Assessment & Plan:  1. Lower respiratory infection - Provided written and verbal information regarding diagnosis and treatment. - ER/RTC precautions reviewed - guaiFENesin-codeine (ROBITUSSIN AC) 100-10 MG/5ML syrup; Take 5 mLs by mouth 3 (three) times daily as needed for cough.  Dispense: 120 mL; Refill: 0 - azithromycin (ZITHROMAX) 250 MG tablet; Take 2 tabs PO x 1 dose, then 1 tab PO QD x 4 days  Dispense: 6 tablet; Refill: 0 - albuterol (PROVENTIL HFA;VENTOLIN HFA) 108 (90 Base) MCG/ACT inhaler; Inhale 2 puffs into the lungs every 4 (four) hours as needed for wheezing or shortness of breath (cough, shortness of breath or wheezing.).  Dispense: 1 Inhaler; Refill: 1 - albuterol (PROVENTIL) (2.5 MG/3ML) 0.083% nebulizer solution 2.5 mg   Clarene Reamer, FNP-BC  [facility], [place]  03/07/2018 7:05 AM

## 2018-03-05 NOTE — Progress Notes (Signed)
Subjective:   Carla Lyons is a 77 y.o. female who presents for Medicare Annual (Subsequent) preventive examination.  Review of Systems:  N/A Cardiac Risk Factors include: advanced age (>64men, >36 women);dyslipidemia;hypertension     Objective:     Vitals: BP 124/80 (BP Location: Right Arm, Patient Position: Sitting, Cuff Size: Normal)   Pulse 65   Temp 99.1 F (37.3 C) (Oral)   Ht 5\' 2"  (1.575 m) Comment: no shoes  Wt 158 lb 12 oz (72 kg)   SpO2 95%   BMI 29.04 kg/m   Body mass index is 29.04 kg/m.  Advanced Directives 03/05/2018 02/26/2017 08/17/2016 04/26/2016 12/18/2014  Does Patient Have a Medical Advance Directive? Yes Yes Yes Yes Yes  Type of Paramedic of [place];Living will [place];Living will [place];Living will [place];Living will [place];Living will  Does patient want to make changes to medical advance directive? - - - - No - Patient declined  Copy of [place] in Chart? Yes No - copy requested No - copy requested - No - copy requested    Tobacco Social History   Tobacco Use  Smoking Status Former Smoker  . Last attempt to quit: 10/07/2001  . Years since quitting: 16.4  Smokeless Tobacco Never Used     Counseling given: No   Clinical Intake:  Pre-visit preparation completed: Yes  Pain Score: 3      Nutritional Status: BMI 25 -29 Overweight Nutritional Risks: None Diabetes: No  How often do you need to have someone help you when you read instructions, pamphlets, or other written materials from your doctor or pharmacy?: 1 - Never What is the last grade level you completed in school?: 12th grade  Interpreter Needed?: No  Comments: pt is a widow and lives alone Information entered by :: LPinson, LPN  Past Medical History:  Diagnosis Date  . Allergy   . Colon polyps 06-15-07  . Hypertension   . Osteoporosis   . Personal  history of colonic adenomas 07/19/2007  . Renal insufficiency   . Thyroid disease    Past Surgical History:  Procedure Laterality Date  . APPENDECTOMY    . CATARACT EXTRACTION W/ INTRAOCULAR LENS IMPLANT Right 01/08/2018  . CATARACT EXTRACTION W/ INTRAOCULAR LENS IMPLANT Left 02/05/2018  . CESAREAN SECTION     x 2  . PARTIAL HYSTERECTOMY  age 41  . VAGINAL HYSTERECTOMY  age 27   partial   Family History  Problem Relation Age of Onset  . Coronary artery disease Mother   . Other Mother        bypass surgery  . Osteoarthritis Father   . Heart attack Maternal Grandfather   . Lung disease Brother   . Hypertension Brother   . Colon cancer Neg Hx   . Esophageal cancer Neg Hx   . Rectal cancer Neg Hx   . Stomach cancer Neg Hx    Social History   Socioeconomic History  . Marital status: Widowed    Spouse name: Not on file  . Number of children: 2  . Years of education: Not on file  . Highest education level: Not on file  Occupational History  . Occupation: Retired     Fish farm manager: RETIRED  Social Needs  . Financial resource strain: Not on file  . Food insecurity:    Worry: Not on file    Inability: Not on file  . Transportation needs:  Medical: Not on file    Non-medical: Not on file  Tobacco Use  . Smoking status: Former Smoker    Last attempt to quit: 10/07/2001    Years since quitting: 16.4  . Smokeless tobacco: Never Used  Substance and Sexual Activity  . Alcohol use: Yes    Comment: rare  . Drug use: No  . Sexual activity: Not on file  Lifestyle  . Physical activity:    Days per week: Not on file    Minutes per session: Not on file  . Stress: Not on file  Relationships  . Social connections:    Talks on phone: Not on file    Gets together: Not on file    Attends religious service: Not on file    Active member of club or organization: Not on file    Attends meetings of clubs or organizations: Not on file    Relationship status: Not on file  Other Topics  Concern  . Not on file  Social History Narrative   Does have a living will.   Daughter is HPOA- does not want any extra ordinary measures.  Would want CPR.    Outpatient Encounter Medications as of 03/05/2018  Medication Sig  . ALPRAZolam (XANAX) 0.25 MG tablet Take 1 tablet (0.25 mg total) by mouth at bedtime.  Marland Kitchen aspirin (ADULT ASPIRIN EC LOW STRENGTH) 81 MG EC tablet Take 81 mg by mouth daily.   Marland Kitchen atorvastatin (LIPITOR) 20 MG tablet Take 1 tablet (20 mg total) by mouth daily.  Marland Kitchen b complex vitamins tablet Take 1 tablet by mouth daily.  . benazepril (LOTENSIN) 10 MG tablet Take 1 tablet (10 mg total) by mouth daily.  . cholecalciferol (VITAMIN D) 1000 UNITS tablet Take 2,000 Units by mouth daily.  Marland Kitchen FERROUS SULFATE PO Take 1 tablet by mouth daily. 5 grams.  Take one tablet at bedtime with orange juice  . furosemide (LASIX) 20 MG tablet Take 20 mg by mouth as needed for edema.  . hydrochlorothiazide (HYDRODIURIL) 12.5 MG tablet   . levothyroxine (SYNTHROID, LEVOTHROID) 88 MCG tablet TAKE 1 TABLET BY MOUTH DAILY  . Omega-3 Fatty Acids (FISH OIL) 1000 MG CAPS Take by mouth. Take two daily   . [DISCONTINUED] predniSONE (DELTASONE) 20 MG tablet Take 1 tablet (20 mg total) by mouth daily with breakfast.   No facility-administered encounter medications on file as of 03/05/2018.     Activities of Daily Living In your present state of health, do you have any difficulty performing the following activities: 03/05/2018  Hearing? N  Vision? N  Difficulty concentrating or making decisions? N  Walking or climbing stairs? N  Dressing or bathing? N  Doing errands, shopping? N  Preparing Food and eating ? N  Using the Toilet? N  In the past six months, have you accidently leaked urine? N  Do you have problems with loss of bowel control? N  Managing your Medications? N  Managing your Finances? N  Housekeeping or managing your Housekeeping? N  Some recent data might be hidden    Patient Care  Team: Elby Beck, FNP as PCP - General (Nurse Practitioner)    Assessment:   This is a routine wellness examination for [name].   Hearing Screening   125Hz  250Hz  500Hz  1000Hz  2000Hz  3000Hz  4000Hz  6000Hz  8000Hz   Right ear:   40 40 40  40    Left ear:   40 40 40  40    Vision Screening Comments: Vision exam  in Jan 2019 with Dr. Marvel Plan   Exercise Activities and Dietary recommendations Current Exercise Habits: Home exercise routine, Type of exercise: walking, Frequency (Times/Week): 7, Intensity: Mild, Exercise limited by: None identified  Goals    . Increase physical activity     Starting 03/05/2018, I will continue to walk at least 45-50 minutes daily.        Fall Risk Fall Risk  03/05/2018 03/05/2017 02/26/2017 03/09/2014  Falls in the past year? No No No No   Depression Screen PHQ 2/9 Scores 03/05/2018 03/05/2017 02/26/2017 03/09/2014  PHQ - 2 Score 1 0 0 0  PHQ- 9 Score 4 - - -     Cognitive Function MMSE - Mini Mental State Exam 03/05/2018 02/26/2017  Orientation to time 5 5  Orientation to Place 5 5  Registration 3 3  Attention/ Calculation 0 0  Recall 3 3  Language- name 2 objects 0 0  Language- repeat 1 1  Language- follow 3 step command 3 3  Language- read & follow direction 0 0  Write a sentence 0 0  Copy design 0 0  Total score 20 20     PLEASE NOTE: A Mini-Cog screen was completed. Maximum score is 20. A value of 0 denotes this part of [place] MMSE was not completed or the patient failed this part of the Mini-Cog screening.   Mini-Cog Screening Orientation to Time - Max 5 pts Orientation to Place - Max 5 pts Registration - Max 3 pts Recall - Max 3 pts Language Repeat - Max 1 pts Language Follow 3 Step Command - Max 3 pts     Immunization History  Administered Date(s) Administered  . Influenza Split 08/05/2011  . Influenza Whole 07/17/2004, 07/10/2009, 07/02/2010  . Influenza,inj,Quad PF,6+ Mos 06/16/2013  . Influenza-Unspecified 06/19/2014,  05/25/2015, 06/11/2017  . Pneumococcal Conjugate-13 03/09/2014  . Pneumococcal Polysaccharide-23 07/17/2004, 02/26/2017  . Td 06/20/2006  . Zoster 03/09/2014    Screening Tests Health Maintenance  Topic Date Due  . TETANUS/TDAP  06/19/2026 (Originally 06/20/2016)  . INFLUENZA VACCINE  05/06/2018  . COLONOSCOPY  08/30/2018  . MAMMOGRAM  01/23/2019  . DEXA SCAN  Completed  . PNA vac Low Risk Adult  Completed      Plan:     I have personally reviewed, addressed, and noted the following in the patient's chart:  A. Medical and social history B. Use of alcohol, tobacco or illicit drugs  C. Current medications and supplements D. Functional ability and status E.  Nutritional status F.  Physical activity G. Advance directives H. List of other physicians I.  Hospitalizations, surgeries, and ER visits in previous 12 months J.  [place] to include hearing, vision, cognitive, depression L. Referrals and appointments - none  In addition, I have reviewed and discussed with patient certain preventive protocols, quality metrics, and best practice recommendations. A written personalized care plan for preventive services as well as general preventive health recommendations were provided to patient.  See attached scanned questionnaire for additional information.   Signed,   Lindell Noe, MHA, BS, LPN Health Coach

## 2018-03-05 NOTE — Patient Instructions (Addendum)
Please use a long acting, non drowsy antihistamine like loratadine or cetirizine instead of Benadryl. Take once a day for allergy/sinus symptoms  I have sent in a cough syrup to your pharmacy, it may make you sleepy. Can take Delsym during the day if you will be driving.   Drink enough liquids to make your urine light yellow.   Can take Tylenol (acetaminophen) for headache, muscle pain.   Use inhaler every 4 to 6 hours today and tomorrow then every 4 to 6 hours as needed  If you develop shortness of breath, fever over 101 over the weekend, please go to ER  If not better by the beginning of next week, please let me know   How to Use a Metered Dose Inhaler A metered dose inhaler is a handheld device for taking medicine that must be breathed into the lungs (inhaled). The device can be used to deliver a variety of inhaled medicines, including:  Quick relief or rescue medicines, such as bronchodilators.  Controller medicines, such as corticosteroids.  The medicine is delivered by pushing down on a metal canister to release a preset amount of spray and medicine. Each device contains the amount of medicine that is needed for a preset number of uses (inhalations). Your health care provider may recommend that you use a spacer with your inhaler to help you take the medicine more effectively. A spacer is a plastic tube with a mouthpiece on one end and an opening that connects to the inhaler on the other end. A spacer holds the medicine in a tube for a short time, which allows you to inhale more medicine. What are the risks? If you do not use your inhaler correctly, medicine might not reach your lungs to help you breathe. Inhaler medicine can cause side effects, such as:  Mouth or throat infection.  Cough.  Hoarseness.  Headache.  Nausea and vomiting.  Lung infection (pneumonia) in people who have a lung condition called COPD.  How to use a metered dose inhaler without a spacer 1. Remove  the cap from the inhaler. 2. If you are using the inhaler for the first time, shake it for 5 seconds, turn it away from your face, then release 4 puffs into the air. This is called priming. 3. Shake the inhaler for 5 seconds. 4. Position the inhaler so the top of the canister faces up. 5. Put your index finger on the top of the medicine canister. Support the bottom of the inhaler with your thumb. 6. Breathe out normally and as completely as possible, away from the inhaler. 7. Either place the inhaler between your teeth and close your lips tightly around the mouthpiece, or hold the inhaler 1-2 inches (2.5-5 cm) away from your open mouth. Keep your tongue down out of the way. If you are unsure which technique to use, ask your health care provider. 8. Press the canister down with your index finger to release the medicine, then inhale deeply and slowly through your mouth (not your nose) until your lungs are completely filled. Inhaling should take 4-6 seconds. 9. Hold the medicine in your lungs for 5-10 seconds (10 seconds is best). This helps the medicine get into the small airways of your lungs. 10. With your lips in a tight circle (pursed), breathe out slowly. 11. Repeat steps 3-10 until you have taken the number of puffs that your health care provider directed. Wait about 1 minute between puffs or as directed. 12. Put the cap on the inhaler.  13. If you are using a steroid inhaler, rinse your mouth with water, gargle, and spit out the water. Do not swallow the water. How to use a metered dose inhaler with a spacer 1. Remove the cap from the inhaler. 2. If you are using the inhaler for the first time, shake it for 5 seconds, turn it away from your face, then release 4 puffs into the air. This is called priming. 3. Shake the inhaler for 5 seconds. 4. Place the open end of the spacer onto the inhaler mouthpiece. 5. Position the inhaler so the top of the canister faces up and the spacer mouthpiece faces  you. 6. Put your index finger on the top of the medicine canister. Support the bottom of the inhaler and the spacer with your thumb. 7. Breathe out normally and as completely as possible, away from the spacer. 8. Place the spacer between your teeth and close your lips tightly around it. Keep your tongue down out of the way. 9. Press the canister down with your index finger to release the medicine, then inhale deeply and slowly through your mouth (not your nose) until your lungs are completely filled. Inhaling should take 4-6 seconds. 10. Hold the medicine in your lungs for 5-10 seconds (10 seconds is best). This helps the medicine get into the small airways of your lungs. 11. With your lips in a tight circle (pursed), breathe out slowly. 12. Repeat steps 3-11 until you have taken the number of puffs that your health care provider directed. Wait about 1 minute between puffs or as directed. 13. Remove the spacer from the inhaler and put the cap on the inhaler. 14. If you are using a steroid inhaler, rinse your mouth with water, gargle, and spit out the water. Do not swallow the water. Follow these instructions at home:  Take your inhaled medicine only as told by your health care provider. Do not use the inhaler more than directed by your health care provider.  Keep all follow-up visits as told by your health care provider. This is important.  If your inhaler has a counter, you can check it to determine how full your inhaler is. If your inhaler does not have a counter, ask your health care provider when you will need to refill your inhaler and write the refill date on a calendar or on your inhaler canister. Note that you cannot know when an inhaler is empty by shaking it.  Follow directions on the package insert for care and cleaning of your inhaler and spacer. Contact a health care provider if:  Symptoms are only partially relieved with your inhaler.  You are having trouble using your  inhaler.  You have an increase in phlegm.  You have headaches. Get help right away if:  You feel little or no relief after using your inhaler.  You have dizziness.  You have a fast heart rate.  You have chills or a fever.  You have night sweats.  There is blood in your phlegm. Summary  A metered dose inhaler is a handheld device for taking medicine that must be breathed into the lungs (inhaled).  The medicine is delivered by pushing down on a metal canister to release a preset amount of spray and medicine.  Each device contains the amount of medicine that is needed for a preset number of uses (inhalations). This information is not intended to replace advice given to you by your health care provider. Make sure you discuss any questions you  have with your health care provider. Document Released: 09/22/2005 Document Revised: 08/12/2016 Document Reviewed: 08/12/2016 Elsevier Interactive Patient Education  2017 [employer].

## 2018-03-05 NOTE — Patient Instructions (Signed)
Ms. Lehenbauer , Thank you for taking time to come for your Medicare Wellness Visit. I appreciate your ongoing commitment to your health goals. Please review the following plan we discussed and let me know if I can assist you in the future.   These are the goals we discussed: Goals    . Increase physical activity     Starting 03/05/2018, I will continue to walk at least 45-50 minutes daily.        This is a list of the screening recommended for you and due dates:  Health Maintenance  Topic Date Due  . Tetanus Vaccine  06/19/2026*  . Flu Shot  05/06/2018  . Colon Cancer Screening  08/30/2018  . Mammogram  01/23/2019  . DEXA scan (bone density measurement)  Completed  . Pneumonia vaccines  Completed  *Topic was postponed. The date shown is not the original due date.   Preventive Care for Adults  A healthy lifestyle and preventive care can promote health and wellness. Preventive health guidelines for adults include the following key practices.  . A routine yearly physical is a good way to check with your health care provider about your health and preventive screening. It is a chance to share any concerns and updates on your health and to receive a thorough exam.  . Visit your dentist for a routine exam and preventive care every 6 months. Brush your teeth twice a day and floss once a day. Good oral hygiene prevents tooth decay and gum disease.  . The frequency of eye exams is based on your age, health, family medical history, use  of contact lenses, and other factors. Follow your health care provider's recommendations for frequency of eye exams.  . Eat a healthy diet. Foods like vegetables, fruits, whole grains, low-fat dairy products, and lean protein foods contain the nutrients you need without too many calories. Decrease your intake of foods high in solid fats, added sugars, and salt. Eat the right amount of calories for you. Get information about a proper diet from your health care  provider, if necessary.  . Regular physical exercise is one of the most important things you can do for your health. Most adults should get at least 150 minutes of moderate-intensity exercise (any activity that increases your heart rate and causes you to sweat) each week. In addition, most adults need muscle-strengthening exercises on 2 or more days a week.  Silver Sneakers may be a benefit available to you. To determine eligibility, you may visit the website: www.silversneakers.com or contact program at 470-637-4058 Mon-Fri between 8AM-8PM.   . Maintain a healthy weight. The body mass index (BMI) is a screening tool to identify possible weight problems. It provides an estimate of body fat based on height and weight. Your health care provider can find your BMI and can help you achieve or maintain a healthy weight.   For adults 20 years and older: ? A BMI below 18.5 is considered underweight. ? A BMI of 18.5 to 24.9 is normal. ? A BMI of 25 to 29.9 is considered overweight. ? A BMI of 30 and above is considered obese.   . Maintain normal blood lipids and cholesterol levels by exercising and minimizing your intake of saturated fat. Eat a balanced diet with plenty of fruit and vegetables. Blood tests for lipids and cholesterol should begin at age 33 and be repeated every 5 years. If your lipid or cholesterol levels are high, you are over 50, or you are at high  risk for heart disease, you may need your cholesterol levels checked more frequently. Ongoing high lipid and cholesterol levels should be treated with medicines if diet and exercise are not working.  . If you smoke, find out from your health care provider how to quit. If you do not use tobacco, please do not start.  . If you choose to drink alcohol, please do not consume more than 2 drinks per day. One drink is considered to be 12 ounces (355 mL) of beer, 5 ounces (148 mL) of wine, or 1.5 ounces (44 mL) of liquor.  . If you are 36-79 years  old, ask your health care provider if you should take aspirin to prevent strokes.  . Use sunscreen. Apply sunscreen liberally and repeatedly throughout the day. You should seek shade when your shadow is shorter than you. Protect yourself by wearing long sleeves, pants, a wide-brimmed hat, and sunglasses year round, whenever you are outdoors.  . Once a month, do a whole body skin exam, using a mirror to look at the skin on your back. Tell your health care provider of new moles, moles that have irregular borders, moles that are larger than a pencil eraser, or moles that have changed in shape or color.

## 2018-03-05 NOTE — Telephone Encounter (Signed)
Patient seen today in office

## 2018-03-05 NOTE — Progress Notes (Signed)
PCP notes:   Health maintenance:  No gaps identified.   Abnormal screenings:   Depression score: 4 Depression screen [facility] 2/9 03/05/2018 03/05/2017 02/26/2017 03/09/2014  Decreased Interest 1 0 0 0  Down, Depressed, Hopeless 0 0 0 0  PHQ - 2 Score 1 0 0 0  Altered sleeping 1 - - -  Tired, decreased energy 1 - - -  Change in appetite 1 - - -  Feeling bad or failure about yourself  0 - - -  Trouble concentrating 0 - - -  Moving slowly or fidgety/restless 0 - - -  Suicidal thoughts 0 - - -  PHQ-9 Score 4 - - -  Difficult doing work/chores Somewhat difficult - - -    Patient concerns:   Non-productive cough. Tightness in chest. PCP notified. Same day appt scheduled with PCP.  Nurse concerns:  None  Next PCP appt:   03/05/18 @ 1600

## 2018-03-06 NOTE — Progress Notes (Signed)
I reviewed health advisor's note, was available for consultation, and agree with documentation and plan.  

## 2018-03-07 ENCOUNTER — Encounter: Payer: Self-pay | Admitting: Family Medicine

## 2018-07-22 ENCOUNTER — Other Ambulatory Visit: Payer: Self-pay | Admitting: Emergency Medicine

## 2018-07-22 DIAGNOSIS — N183 Chronic kidney disease, stage 3 unspecified: Secondary | ICD-10-CM

## 2018-07-22 DIAGNOSIS — I10 Essential (primary) hypertension: Secondary | ICD-10-CM

## 2018-07-22 DIAGNOSIS — E785 Hyperlipidemia, unspecified: Secondary | ICD-10-CM

## 2018-07-22 MED ORDER — ATORVASTATIN CALCIUM 20 MG PO TABS: 20.0000 mg | ORAL_TABLET | Freq: Every day | ORAL | 0 refills | Status: DC

## 2018-07-22 MED ORDER — BENAZEPRIL HCL 10 MG PO TABS: 10.0000 mg | ORAL_TABLET | Freq: Every day | ORAL | 0 refills | Status: DC

## 2018-10-13 ENCOUNTER — Other Ambulatory Visit: Payer: Self-pay | Admitting: Family Medicine

## 2018-10-13 DIAGNOSIS — N183 Chronic kidney disease, stage 3 unspecified: Secondary | ICD-10-CM

## 2018-10-13 DIAGNOSIS — I10 Essential (primary) hypertension: Secondary | ICD-10-CM

## 2018-10-13 DIAGNOSIS — E785 Hyperlipidemia, unspecified: Secondary | ICD-10-CM

## 2018-10-15 ENCOUNTER — Telehealth: Payer: Self-pay | Admitting: Family Medicine

## 2018-10-15 ENCOUNTER — Ambulatory Visit (INDEPENDENT_AMBULATORY_CARE_PROVIDER_SITE_OTHER): Payer: Medicare Other | Admitting: Family Medicine

## 2018-10-15 ENCOUNTER — Encounter

## 2018-10-15 ENCOUNTER — Encounter: Payer: Self-pay | Admitting: Family Medicine

## 2018-10-15 VITALS — BP 118/64 | HR 62 | Temp 97.8°F | Wt 154.0 lb

## 2018-10-15 DIAGNOSIS — R1032 Left lower quadrant pain: Secondary | ICD-10-CM | POA: Diagnosis not present

## 2018-10-15 LAB — POC URINALSYSI DIPSTICK (AUTOMATED)
Bilirubin, UA: NEGATIVE
Blood, UA: NEGATIVE
Glucose, UA: NEGATIVE
Ketones, UA: NEGATIVE
Leukocytes, UA: NEGATIVE
Nitrite, UA: NEGATIVE
Protein, UA: NEGATIVE
Spec Grav, UA: 1.02
Urobilinogen, UA: 0.2 U/dL
pH, UA: 6

## 2018-10-15 MED ORDER — AMOXICILLIN-POT CLAVULANATE 875-125 MG PO TABS: 1.0000 | ORAL_TABLET | Freq: Two times a day (BID) | ORAL | 0 refills | Status: DC

## 2018-10-15 NOTE — Telephone Encounter (Signed)
Noted  

## 2018-10-15 NOTE — Patient Instructions (Signed)
Good to see you today  I think you may be having a diverticulitis flare, it seems like it is getting better, take Miralax daily until bowel movements are regular  If you develop worsening pain or fever over 100, start antibiotic. Can continue Tylenol and heat for pain  If you get worse over the weekend, go to ER

## 2018-10-15 NOTE — Progress Notes (Signed)
Subjective:    Patient ID: Carla Lyons, female    DOB: 04-26-1941, 78 y.o.   MRN: [id number]  HPI This is a 78 yo female who presents today with 4 days of upper abdominal soreness, goes to back. Has had some constipation. Had a bowel movement this am, small. Pain comes and goes, worse in am, better after she starts moving around. Started feeling significantly better last night and this am. No fever/chills. No nausea/vominging.  No blood or mucus. No increased urination, no dysuria. Some improvement with heat, and tylenol.  Last colonoscopy 11/14- she is due 5 year recall. Severe diverticulosis. She rarely has diverticular flare.   Past Medical History:  Diagnosis Date  . Allergy   . Colon polyps 06-15-07  . Hypertension   . Osteoporosis   . Personal history of colonic adenomas 07/19/2007  . Renal insufficiency   . Thyroid disease    Past Surgical History:  Procedure Laterality Date  . APPENDECTOMY    . CATARACT EXTRACTION W/ INTRAOCULAR LENS IMPLANT Right 01/08/2018  . CATARACT EXTRACTION W/ INTRAOCULAR LENS IMPLANT Left 02/05/2018  . CESAREAN SECTION     x 2  . PARTIAL HYSTERECTOMY  age 63  . VAGINAL HYSTERECTOMY  age 22   partial   Family History  Problem Relation Age of Onset  . Coronary artery disease Mother   . Other Mother        bypass surgery  . Osteoarthritis Father   . Heart attack Maternal Grandfather   . Lung disease Brother   . Hypertension Brother   . Colon cancer Neg Hx   . Esophageal cancer Neg Hx   . Rectal cancer Neg Hx   . Stomach cancer Neg Hx    Social History   Tobacco Use  . Smoking status: Former Smoker    Last attempt to quit: 10/07/2001    Years since quitting: 17.0  . Smokeless tobacco: Never Used  Substance Use Topics  . Alcohol use: Yes    Comment: rare  . Drug use: No      Review of Systems Per HPI    Objective:   Physical Exam Vitals signs reviewed.  Constitutional:      General: She is not in acute distress.    Appearance:  She is well-developed and normal weight. She is not ill-appearing or toxic-appearing.  HENT:     Head: Normocephalic and atraumatic.  Cardiovascular:     Rate and Rhythm: Normal rate and regular rhythm.     Heart sounds: Normal heart sounds.  Abdominal:     General: Abdomen is flat. Bowel sounds are normal. There is no distension.     Palpations: Abdomen is soft. There is no hepatomegaly or mass.     Tenderness: There is abdominal tenderness (mild with deep palpation) in the left lower quadrant. There is no right CVA tenderness, left CVA tenderness, guarding or rebound. Negative signs include Murphy's sign and McBurney's sign.     Hernia: No hernia is present.  Skin:    General: Skin is warm and dry.  Neurological:     Mental Status: She is alert and oriented to person, place, and time.  Psychiatric:        Mood and Affect: Mood normal.        Behavior: Behavior normal.       BP 118/64   Pulse 62   Temp 97.8 F (36.6 C) (Oral)   Wt 154 lb (69.9 kg)   SpO2 99%  BMI 28.17 kg/m  Wt Readings from Last 3 Encounters:  10/15/18 154 lb (69.9 kg)  03/05/18 158 lb (71.7 kg)  03/05/18 158 lb 12 oz (72 kg)   Results for orders placed or performed in visit on 10/15/18  POCT Urinalysis Dipstick (Automated)  Result Value Ref Range   Color, UA yellow    Clarity, UA clear    Glucose, UA Negative Negative   Bilirubin, UA neg    Ketones, UA neg    Spec Grav, UA 1.020 1.010 - 1.025   Blood, UA neg    pH, UA 6.0 5.0 - 8.0   Protein, UA Negative Negative   Urobilinogen, UA 0.2 0.2 or 1.0 E.U./dL   Nitrite, UA neg    Leukocytes, UA Negative Negative       Assessment & Plan:  1. Left lower quadrant abdominal pain - suspect diverticulitis that is improving - Provided written and verbal information regarding diagnosis and treatment. -Provided her an antibiotic prescription if her symptoms do not continue to improve or if she runs a temperature over 100  - RTC/ER precautions  revivewed - encouraged her to take daily Miralax to improve bowel movements, drink adequate water -Discussed her previous colonoscopy results and presence of diverticulosis.  She is aware of need for follow-up for repeat colonoscopy - POCT Urinalysis Dipstick (Automated) - amoxicillin-clavulanate (AUGMENTIN) 875-125 MG tablet; Take 1 tablet by mouth 2 (two) times daily.  Dispense: 14 tablet; Refill: 0 - CBC with Differential/Platelet; Future - Comprehensive metabolic panel; Future   Clarene Reamer, FNP-BC  [facility]  10/16/2018 7:53 AM

## 2018-10-15 NOTE — Telephone Encounter (Signed)
Tor Netters, NP had ordered labs for patient to have done today. Patient misunderstood and left without having them done. I called patient and let her know if she was continuing to have stomach pain on Monday that [facility] wanted her to come back in to have her labwork done. Patient understood and agrees.

## 2018-10-16 ENCOUNTER — Encounter: Payer: Self-pay | Admitting: Family Medicine

## 2018-10-17 ENCOUNTER — Other Ambulatory Visit: Payer: Self-pay | Admitting: Family Medicine

## 2018-10-18 ENCOUNTER — Other Ambulatory Visit (INDEPENDENT_AMBULATORY_CARE_PROVIDER_SITE_OTHER): Payer: Medicare Other

## 2018-10-18 DIAGNOSIS — R1032 Left lower quadrant pain: Secondary | ICD-10-CM

## 2018-10-18 DIAGNOSIS — E875 Hyperkalemia: Secondary | ICD-10-CM

## 2018-10-18 DIAGNOSIS — R8290 Unspecified abnormal findings in urine: Secondary | ICD-10-CM

## 2018-10-18 LAB — CBC WITH DIFFERENTIAL/PLATELET
Basophils Absolute: 0.1 10*3/uL (ref 0.0–0.1)
Basophils Relative: 1 % (ref 0.0–3.0)
Eosinophils Absolute: 0.3 10*3/uL (ref 0.0–0.7)
Eosinophils Relative: 4.2 % (ref 0.0–5.0)
HCT: 32.8 % — ABNORMAL LOW (ref 36.0–46.0)
Hemoglobin: 10.9 g/dL — ABNORMAL LOW (ref 12.0–15.0)
Lymphocytes Relative: 23.6 % (ref 12.0–46.0)
Lymphs Abs: 1.6 10*3/uL (ref 0.7–4.0)
MCHC: 33.3 g/dL (ref 30.0–36.0)
MCV: 101 fl — ABNORMAL HIGH (ref 78.0–100.0)
Monocytes Absolute: 0.5 10*3/uL (ref 0.1–1.0)
Monocytes Relative: 7.9 % (ref 3.0–12.0)
Neutro Abs: 4.2 10*3/uL (ref 1.4–7.7)
Neutrophils Relative %: 63.3 % (ref 43.0–77.0)
Platelets: 245 10*3/uL (ref 150.0–400.0)
RBC: 3.25 Mil/uL — ABNORMAL LOW (ref 3.87–5.11)
RDW: 13.7 % (ref 11.5–15.5)
WBC: 6.6 10*3/uL (ref 4.0–10.5)

## 2018-10-18 LAB — COMPREHENSIVE METABOLIC PANEL
ALT: 19 U/L (ref 0–35)
AST: 19 U/L (ref 0–37)
Albumin: 4.3 g/dL (ref 3.5–5.2)
Alkaline Phosphatase: 47 U/L (ref 39–117)
BUN: 37 mg/dL — ABNORMAL HIGH (ref 6–23)
CO2: 26 mEq/L (ref 19–32)
Calcium: 9.7 mg/dL (ref 8.4–10.5)
Chloride: 107 mEq/L (ref 96–112)
Creatinine, Ser: 1.44 mg/dL — ABNORMAL HIGH (ref 0.40–1.20)
GFR: 37.56 mL/min — ABNORMAL LOW (ref >60.00)
Glucose, Bld: 97 mg/dL (ref 70–99)
Potassium: 5.6 mEq/L — ABNORMAL HIGH (ref 3.5–5.1)
Sodium: 139 mEq/L (ref 135–145)
Total Bilirubin: 0.5 mg/dL (ref 0.2–1.2)
Total Protein: 7.2 g/dL (ref 6.0–8.3)

## 2018-10-18 NOTE — Addendum Note (Signed)
Addended by: Clarene Reamer B on: 10/18/2018 02:42 PM   Modules accepted: Orders

## 2018-10-21 ENCOUNTER — Other Ambulatory Visit (INDEPENDENT_AMBULATORY_CARE_PROVIDER_SITE_OTHER): Payer: Medicare Other

## 2018-10-21 DIAGNOSIS — R8290 Unspecified abnormal findings in urine: Secondary | ICD-10-CM | POA: Diagnosis not present

## 2018-10-21 DIAGNOSIS — E875 Hyperkalemia: Secondary | ICD-10-CM | POA: Diagnosis not present

## 2018-10-21 LAB — BASIC METABOLIC PANEL
BUN: 36 mg/dL — ABNORMAL HIGH (ref 6–23)
CO2: 27 mEq/L (ref 19–32)
Calcium: 9.6 mg/dL (ref 8.4–10.5)
Chloride: 107 mEq/L (ref 96–112)
Creatinine, Ser: 1.32 mg/dL — ABNORMAL HIGH (ref 0.40–1.20)
GFR: 41.52 mL/min — ABNORMAL LOW (ref >60.00)
Glucose, Bld: 93 mg/dL (ref 70–99)
Potassium: 5.3 mEq/L — ABNORMAL HIGH (ref 3.5–5.1)
Sodium: 140 mEq/L (ref 135–145)

## 2018-10-21 LAB — URINALYSIS, ROUTINE W REFLEX MICROSCOPIC
Bilirubin Urine: NEGATIVE
Hgb urine dipstick: NEGATIVE
Ketones, ur: NEGATIVE
Leukocytes, UA: NEGATIVE
Nitrite: NEGATIVE
RBC / HPF: NONE SEEN (ref 0–?)
Specific Gravity, Urine: 1.015 (ref 1.000–1.030)
Total Protein, Urine: NEGATIVE
Urine Glucose: NEGATIVE
Urobilinogen, UA: 0.2 (ref 0.0–1.0)
pH: 6 (ref 5.0–8.0)

## 2018-10-24 NOTE — Addendum Note (Signed)
Addended by: Clarene Reamer B on: 10/24/2018 05:36 PM   Modules accepted: Orders

## 2018-10-25 NOTE — Progress Notes (Signed)
BP 138/66 (BP Location: Left Arm, Patient Position: Sitting, Cuff Size: Normal)   Pulse 67   Temp 98 F (36.7 C) (Oral)   Ht 5\' 2"  (1.575 m)   Wt 156 lb 12 oz (71.1 kg)   SpO2 97%   BMI 28.67 kg/m   Orthostatic VS for the past 24 hrs (Last 3 readings):  BP- Lying BP- Standing at 0 minutes  10/26/18 1042 - 116/64  10/26/18 1037 118/60 -    CC: malaise Subjective:    Patient ID: Carla Lyons, female    DOB: March 16, 1941, 78 y.o.   MRN: [id number]  HPI: Carla Lyons is a 78 y.o. female presenting on 10/26/2018 for Results (Here to discuss abnormal liver results. Pt provided copy (made copy) of results.)   Recently seen by nephrologist - told anemic. Brings copy of labs from [facility] dated 10/08/2018 ([facility]), Cr 1.23 (GFR 43), Hgb 9.5 with MCV 101. UA unrevealing, UCx no growth. Lab report scanned into chart. B12 was not checked. rec start oral b12 and folate daily. She already takes ferrous sulfate - started remotely.   Labs from our office and latest note from [facility] reviewed with hyperkalemia (5.6 1/13 --> 5.3 1/16).  abd pain has resolved. Hgb 10.9 with MCV 101 (1/13). Seen 1/10 with LLQ pain concern for diverticulitis, Rx augmentin - she ended up not taking antibiotics and LLQ pain resolved on its own.   She endorses 2 weeks of episodes of weakness with lightheadedness and fatigue/low energy, malaise. Dry mouth. Some intermittent lower back pain. Ongoing anhedonia. Predominant concern is no energy. No unexpected weight loss or night sweats, fevers/chills. No nausea/vomiting or bowel changes. When she last felt like this (years ago), her thyroid was off. Occasional palpitations of heart racing, rare seconds of dyspnea. No chest pain, headache. Occasional hand paresthesias.   Denies depression history. Denies current depression/anhedonia. Worries about sister who is going through health issues of her own - she helps care for sister.   Lab Results  Component Value Date   TSH  2.03 03/05/2018    Lab Results  Component Value Date   VITAMINB12 415 05/18/2013    Lab Results  Component Value Date   IRON 81 01/05/2013   TIBC 388 05/27/2011   FERRITIN 24 05/18/2013       Relevant past medical, surgical, family and social history reviewed and updated as indicated. Interim medical history since our last visit reviewed. Allergies and medications reviewed and updated. Outpatient Medications Prior to Visit  Medication Sig Dispense Refill  . ALPRAZolam (XANAX) 0.25 MG tablet Take 1 tablet (0.25 mg total) by mouth at bedtime. 30 tablet 2  . aspirin (ADULT ASPIRIN EC LOW STRENGTH) 81 MG EC tablet Take 81 mg by mouth daily.     Marland Kitchen atorvastatin (LIPITOR) 20 MG tablet TAKE 1 TABLET BY MOUTH EVERY DAY 90 tablet 0  . b complex vitamins tablet Take 1 tablet by mouth daily.    . benazepril (LOTENSIN) 10 MG tablet TAKE 1 TABLET BY MOUTH EVERY DAY 90 tablet 0  . cholecalciferol (VITAMIN D) 1000 UNITS tablet Take 2,000 Units by mouth daily.    Marland Kitchen FERROUS SULFATE PO Take 1 tablet by mouth daily. 5 grams.  Take one tablet at bedtime with orange juice    . furosemide (LASIX) 20 MG tablet Take 20 mg by mouth as needed for edema.    . hydrochlorothiazide (HYDRODIURIL) 12.5 MG tablet     . levothyroxine (SYNTHROID, LEVOTHROID) 88 MCG  tablet TAKE 1 TABLET BY MOUTH EVERY DAY 90 tablet 0  . Omega-3 Fatty Acids (FISH OIL) 1000 MG CAPS Take by mouth. Take two daily     . albuterol (PROVENTIL HFA;VENTOLIN HFA) 108 (90 Base) MCG/ACT inhaler Inhale 2 puffs into the lungs every 4 (four) hours as needed for wheezing or shortness of breath (cough, shortness of breath or wheezing.). 1 Inhaler 1  . amoxicillin-clavulanate (AUGMENTIN) 875-125 MG tablet Take 1 tablet by mouth 2 (two) times daily. 14 tablet 0  . azithromycin (ZITHROMAX) 250 MG tablet Take 2 tabs PO x 1 dose, then 1 tab PO QD x 4 days 6 tablet 0  . guaiFENesin-codeine (ROBITUSSIN AC) 100-10 MG/5ML syrup Take 5 mLs by mouth 3 (three) times  daily as needed for cough. 120 mL 0   No facility-administered medications prior to visit.      Per HPI unless specifically indicated in ROS section below Review of Systems Objective:    BP 138/66 (BP Location: Left Arm, Patient Position: Sitting, Cuff Size: Normal)   Pulse 67   Temp 98 F (36.7 C) (Oral)   Ht 5\' 2"  (1.575 m)   Wt 156 lb 12 oz (71.1 kg)   SpO2 97%   BMI 28.67 kg/m   Wt Readings from Last 3 Encounters:  10/26/18 156 lb 12 oz (71.1 kg)  10/15/18 154 lb (69.9 kg)  03/05/18 158 lb (71.7 kg)    Physical Exam Vitals signs and nursing note reviewed.  Constitutional:      General: She is not in acute distress.    Appearance: Normal appearance. She is well-developed. She is not ill-appearing.  HENT:     Head: Normocephalic and atraumatic.     Mouth/Throat:     Mouth: Mucous membranes are moist.     Pharynx: Oropharynx is clear. No oropharyngeal exudate.  Eyes:     General: No scleral icterus.    Extraocular Movements: Extraocular movements intact.     Conjunctiva/sclera: Conjunctivae normal.     Pupils: Pupils are equal, round, and reactive to light.  Neck:     Musculoskeletal: Normal range of motion and neck supple.     Vascular: No carotid bruit.  Cardiovascular:     Rate and Rhythm: Normal rate and regular rhythm.     Pulses: Normal pulses.     Heart sounds: Normal heart sounds. No murmur.  Pulmonary:     Effort: Pulmonary effort is normal. No respiratory distress.     Breath sounds: Normal breath sounds. No wheezing or rales.  Lymphadenopathy:     Cervical: No cervical adenopathy.  Skin:    General: Skin is warm and dry.     Findings: No rash.  Neurological:     General: No focal deficit present.     Mental Status: She is alert.     Cranial Nerves: Cranial nerves are intact.     Sensory: Sensation is intact.     Motor: Motor function is intact.     Coordination: Coordination is intact.     Comments: CN 2-12 intact FTN intact EOMI  Neg romberg    Psychiatric:        Mood and Affect: Mood normal.        Behavior: Behavior normal.       Results for orders placed or performed in visit on 10/21/18  Urinalysis, Routine w reflex microscopic  Result Value Ref Range   Color, Urine YELLOW Yellow;Lt. Yellow;Straw;Dark Yellow;Amber;Green;Red;Brown   APPearance CLEAR Clear;Turbid;Slightly Cloudy;Cloudy  Specific Gravity, Urine 1.015 1.000 - 1.030   pH 6.0 5.0 - 8.0   Total Protein, Urine NEGATIVE Negative   Urine Glucose NEGATIVE Negative   Ketones, ur NEGATIVE Negative   Bilirubin Urine NEGATIVE Negative   Hgb urine dipstick NEGATIVE Negative   Urobilinogen, UA 0.2 0.0 - 1.0   Leukocytes, UA NEGATIVE Negative   Nitrite NEGATIVE Negative   WBC, UA 0-2/hpf 0-2/hpf   RBC / HPF none seen 0-2/hpf   Mucus, UA Presence of (A) None   Squamous Epithelial / LPF Rare(0-4/hpf) Rare(0-4/hpf)   Bacteria, UA Rare(<10/hpf) (A) None   Hyaline Casts, UA Presence of (A) None  Basic Metabolic Panel  Result Value Ref Range   Sodium 140 135 - 145 mEq/L   Potassium 5.3 no visible hemolysis (H) 3.5 - 5.1 mEq/L   Chloride 107 96 - 112 mEq/L   CO2 27 19 - 32 mEq/L   Glucose, Bld 93 70 - 99 mg/dL   BUN 36 (H) 6 - 23 mg/dL   Creatinine, Ser 1.32 (H) 0.40 - 1.20 mg/dL   Calcium 9.6 8.4 - 10.5 mg/dL   GFR 41.52 (L) >60.00 mL/min   EKG - sinus bradycardia rate 50s, PAC x1, normal axis, intervals, no acute ST/T changes.  Assessment & Plan:   Problem List Items Addressed This Visit    Macrocytic anemia    New macrocytosis. Hgb improved on repeat in our office. Will check vit B12 and folate to further eval for other causes of macrocytic anemia. B12 shot today. rec start oral B12 while we await labs. B12 shot was given prior to labwork - may be artifically high.       Relevant Medications   cyanocobalamin ((VITAMIN B-12)) injection 1,000 mcg (Completed)   Other Relevant Orders   Vitamin B12   Folate   Potassium   Hypothyroidism    Update TSH,  free T4 in setting of increasing fatigue      Relevant Orders   TSH   T4, free   Hyperkalemia    Update potassium.       Relevant Orders   Potassium   Fatigue - Primary    Predominant concern is new and worsening fatigue over the past 2 weeks, associated with lightheadedness. Check EKG today. Check labs for fatigue. Anticipate may be B12 deficiency related with recently noted macrocytic anemia. R/o thyroid imbalance.  Discussed OTC dissolvable B12 tablets vs oral capsule - she will start oral replacement today as well.       Relevant Medications   cyanocobalamin ((VITAMIN B-12)) injection 1,000 mcg (Completed)   Depression    Denies significant dysthymia or depressed mood.       CKD (chronic kidney disease), stage III (HCC)   Anemia in chronic renal disease   Relevant Medications   cyanocobalamin ((VITAMIN B-12)) injection 1,000 mcg (Completed)   Other Relevant Orders   IBC panel   Ferritin    Other Visit Diagnoses    Lightheadedness       Relevant Orders   EKG 12-Lead (Completed)       Meds ordered this encounter  Medications  . cyanocobalamin ((VITAMIN B-12)) injection 1,000 mcg   Orders Placed This Encounter  Procedures  . Vitamin B12  . IBC panel  . Ferritin  . Folate  . TSH  . T4, free  . Potassium  . EKG 12-Lead    Follow up plan: Return if symptoms worsen or fail to improve.  Carla Bush, MD

## 2018-10-26 ENCOUNTER — Ambulatory Visit (INDEPENDENT_AMBULATORY_CARE_PROVIDER_SITE_OTHER): Payer: Medicare Other | Admitting: Family Medicine

## 2018-10-26 ENCOUNTER — Encounter: Payer: Self-pay | Admitting: Family Medicine

## 2018-10-26 VITALS — BP 138/66 | HR 67 | Temp 98.0°F | Ht 62.0 in | Wt 156.8 lb

## 2018-10-26 DIAGNOSIS — R5383 Other fatigue: Secondary | ICD-10-CM

## 2018-10-26 DIAGNOSIS — N183 Chronic kidney disease, stage 3 unspecified: Secondary | ICD-10-CM

## 2018-10-26 DIAGNOSIS — D539 Nutritional anemia, unspecified: Secondary | ICD-10-CM | POA: Diagnosis not present

## 2018-10-26 DIAGNOSIS — R42 Dizziness and giddiness: Secondary | ICD-10-CM | POA: Diagnosis not present

## 2018-10-26 DIAGNOSIS — E038 Other specified hypothyroidism: Secondary | ICD-10-CM | POA: Diagnosis not present

## 2018-10-26 DIAGNOSIS — E875 Hyperkalemia: Secondary | ICD-10-CM | POA: Diagnosis not present

## 2018-10-26 DIAGNOSIS — D631 Anemia in chronic kidney disease: Secondary | ICD-10-CM | POA: Diagnosis not present

## 2018-10-26 DIAGNOSIS — F329 Major depressive disorder, single episode, unspecified: Secondary | ICD-10-CM

## 2018-10-26 DIAGNOSIS — F32A Depression, unspecified: Secondary | ICD-10-CM

## 2018-10-26 LAB — FERRITIN: Ferritin: 18.8 ng/mL (ref 10.0–291.0)

## 2018-10-26 LAB — IBC PANEL
Iron: 71 ug/dL (ref 42–145)
Saturation Ratios: 17.2 % — ABNORMAL LOW (ref 20.0–50.0)
Transferrin: 295 mg/dL (ref 212.0–360.0)

## 2018-10-26 LAB — TSH: TSH: 2.36 u[IU]/mL (ref 0.35–4.50)

## 2018-10-26 LAB — POTASSIUM: Potassium: 4.9 mEq/L (ref 3.5–5.1)

## 2018-10-26 LAB — FOLATE: Folate: >24 ng/mL (ref >5.9)

## 2018-10-26 LAB — T4, FREE: Free T4: 1.74 ng/dL — ABNORMAL HIGH (ref 0.60–1.60)

## 2018-10-26 MED ORDER — CYANOCOBALAMIN 1000 MCG/ML IJ SOLN
1000.0000 ug | Freq: Once | INTRAMUSCULAR | Status: AC
  Administered 2018-10-26: 1000 ug via INTRAMUSCULAR

## 2018-10-26 NOTE — Assessment & Plan Note (Signed)
Update potassium.

## 2018-10-26 NOTE — Patient Instructions (Addendum)
Labs today including thyroid.  EKG today B12 shot after labs.  Start vitamin B12 102mcg daily (dissolvable tablets if you can find, otherwise may take oral b12 capsules).  Good to see you today, call us with questions.

## 2018-10-26 NOTE — Assessment & Plan Note (Addendum)
Predominant concern is new and worsening fatigue over the past 2 weeks, associated with lightheadedness. Check EKG today. Check labs for fatigue. Anticipate may be B12 deficiency related with recently noted macrocytic anemia. R/o thyroid imbalance.  Discussed OTC dissolvable B12 tablets vs oral capsule - she will start oral replacement today as well.

## 2018-10-26 NOTE — Assessment & Plan Note (Signed)
New macrocytosis. Hgb improved on repeat in our office. Will check vit B12 and folate to further eval for other causes of macrocytic anemia. B12 shot today. rec start oral B12 while we await labs. B12 shot was given prior to labwork - may be artifically high.

## 2018-10-26 NOTE — Assessment & Plan Note (Signed)
Denies significant dysthymia or depressed mood.

## 2018-10-26 NOTE — Assessment & Plan Note (Signed)
Update TSH, free T4 in setting of increasing fatigue

## 2018-10-27 ENCOUNTER — Encounter: Payer: Self-pay | Admitting: Internal Medicine

## 2018-10-27 ENCOUNTER — Ambulatory Visit: Payer: Medicare Other | Admitting: Primary Care

## 2018-10-28 LAB — VITAMIN B12: Vitamin B-12: >1525 pg/mL — ABNORMAL HIGH (ref 211–911)

## 2018-11-05 ENCOUNTER — Ambulatory Visit (AMBULATORY_SURGERY_CENTER): Payer: Self-pay

## 2018-11-05 VITALS — Ht 63.0 in | Wt 157.4 lb

## 2018-11-05 DIAGNOSIS — Z86010 Personal history of colonic polyps: Secondary | ICD-10-CM

## 2018-11-05 NOTE — Progress Notes (Signed)
Per pt, no allergies to soy or egg products.Pt not taking any weight loss meds or using  O2 at home.  Pt refused emmi video. 

## 2018-11-08 ENCOUNTER — Encounter: Payer: Self-pay | Admitting: Internal Medicine

## 2018-11-17 ENCOUNTER — Ambulatory Visit (AMBULATORY_SURGERY_CENTER): Payer: Medicare Other | Admitting: Internal Medicine

## 2018-11-17 ENCOUNTER — Encounter: Payer: Self-pay | Admitting: Internal Medicine

## 2018-11-17 VITALS — BP 126/61 | HR 60 | Temp 96.8°F | Resp 14 | Ht 63.0 in | Wt 157.0 lb

## 2018-11-17 DIAGNOSIS — Z86010 Personal history of colonic polyps: Secondary | ICD-10-CM

## 2018-11-17 DIAGNOSIS — K5520 Angiodysplasia of colon without hemorrhage: Secondary | ICD-10-CM

## 2018-11-17 HISTORY — PX: COLONOSCOPY: SHX174

## 2018-11-17 HISTORY — DX: Angiodysplasia of colon without hemorrhage: K55.20

## 2018-11-17 MED ORDER — SODIUM CHLORIDE 0.9 % IV SOLN: 500.0000 mL | Freq: Once | INTRAVENOUS | Status: DC

## 2018-11-17 NOTE — Progress Notes (Signed)
I have reviewed the patient's medical history in detail and updated the computerized patient record.

## 2018-11-17 NOTE — Op Note (Addendum)
[facility] Patient Name: Carla Lyons Procedure Date: 11/17/2018 2:01 PM MRN: [id number] Endoscopist: Gatha Mayer , MD Age: 78 Referring MD:  Date of Birth: Jan 07, 1941 Gender: Female Account #: [id number] Procedure:                Colonoscopy Indications:              Surveillance: Personal history of adenomatous                            polyps on last colonoscopy > 5 years ago Medicines:                Propofol per Anesthesia, Monitored Anesthesia Care Procedure:                Pre-Anesthesia Assessment:                           - Prior to the procedure, a History and Physical                            was performed, and patient medications and                            allergies were reviewed. The patient's tolerance of                            previous anesthesia was also reviewed. The risks                            and benefits of the procedure and the sedation                            options and risks were discussed with the patient.                            All questions were answered, and informed consent                            was obtained. Prior Anticoagulants: The patient has                            taken no previous anticoagulant or antiplatelet                            agents. ASA Grade Assessment: II - A patient with                            mild systemic disease. After reviewing the risks                            and benefits, the patient was deemed in                            satisfactory condition to undergo the procedure.  After obtaining informed consent, the colonoscope                            was passed under direct vision. Throughout the                            procedure, the patient's blood pressure, pulse, and                            oxygen saturations were monitored continuously. The                            Colonoscope was introduced through the anus and   advanced to the the cecum, identified by                            appendiceal orifice and ileocecal valve. The                            colonoscopy was performed without difficulty. The                            patient tolerated the procedure well. The quality                            of the bowel preparation was good. The bowel                            preparation used was Miralax. The ileocecal valve,                            appendiceal orifice, and rectum were photographed. Scope In: 2:16:20 PM Scope Out: 2:29:21 PM Scope Withdrawal Time: 0 hours 10 minutes 8 seconds  Total Procedure Duration: 0 hours 13 minutes 1 second  Findings:                 The perianal and digital rectal examinations were                            normal.                           Multiple diverticula were found in the sigmoid                            colon. There was narrowing of the colon in                            association with the diverticular opening.                           A single small angiodysplastic lesion without                            bleeding was found in the ascending colon.  The exam was otherwise without abnormality on                            direct and retroflexion views. Complications:            No immediate complications. Estimated Blood Loss:     Estimated blood loss: none. Impression:               - Diverticulosis in the sigmoid colon. There was                            narrowing of the colon in association with the                            diverticular opening. Pediatric colonoscope used.                           - A single non-bleeding colonic angiodysplastic                            lesion. NEW DX IRON-DEFICIENCY ANEMIA MAY BE                            RELATED TO THIS. IF SHE FAILS TO RESPOND TO IRON                            CONSIDER ABLATION OF THIS AVM AND AN EGD                           - The examination was  otherwise normal on direct                            and retroflexion views.                           - No specimens collected.                           - Personal history of colonic polyps. Recommendation:           - Patient has a contact number available for                            emergencies. The signs and symptoms of potential                            delayed complications were discussed with the                            patient. Return to normal activities tomorrow.                            Written discharge instructions were provided to the                            patient.                           -  Resume previous diet.                           - Continue present medications.                           - No repeat colonoscopy due to age and the absence                            of colonic polyps today. Gatha Mayer, MD 11/17/2018 2:39:32 PM This report has been signed electronically.

## 2018-11-17 NOTE — Patient Instructions (Addendum)
No polyps this time. Given that and current recommendations I am not recommending a routine repeat exam (next one would be in 5 years and not typically done on a routine basis at 82).  There was a small lesion of dilated blood vessel called angiodysplasia or AVM.  I appreciate the opportunity to care for you. Gatha Mayer, MD, [facility]  Please read handouts provided. Continue present medications.     YOU HAD AN ENDOSCOPIC PROCEDURE TODAY AT [facility]:   Refer to the procedure report that was given to you for any specific questions about what was found during the examination.  If the procedure report does not answer your questions, please call your gastroenterologist to clarify.  If you requested that your care partner not be given the details of your procedure findings, then the procedure report has been included in a sealed envelope for you to review at your convenience later.  YOU SHOULD EXPECT: Some feelings of bloating in the abdomen. Passage of more gas than usual.  Walking can help get rid of the air that was put into your GI tract during the procedure and reduce the bloating. If you had a lower endoscopy (such as a colonoscopy or flexible sigmoidoscopy) you may notice spotting of blood in your stool or on the toilet paper. If you underwent a bowel prep for your procedure, you may not have a normal bowel movement for a few days.  Please Note:  You might notice some irritation and congestion in your nose or some drainage.  This is from the oxygen used during your procedure.  There is no need for concern and it should clear up in a day or so.  SYMPTOMS TO REPORT IMMEDIATELY:   Following lower endoscopy (colonoscopy or flexible sigmoidoscopy):  Excessive amounts of blood in the stool  Significant tenderness or worsening of abdominal pains  Swelling of the abdomen that is new, acute  Fever of 100F or higher   For urgent or emergent issues, a  gastroenterologist can be reached at any hour by calling [phone].   DIET:  We do recommend a small meal at first, but then you may proceed to your regular diet.  Drink plenty of fluids but you should avoid alcoholic beverages for 24 hours.  ACTIVITY:  You should plan to take it easy for the rest of today and you should NOT DRIVE or use heavy machinery until tomorrow (because of the sedation medicines used during the test).    FOLLOW UP: Our staff will call the number listed on your records the next business day following your procedure to check on you and address any questions or concerns that you may have regarding the information given to you following your procedure. If we do not reach you, we will leave a message.  However, if you are feeling well and you are not experiencing any problems, there is no need to return our call.  We will assume that you have returned to your regular daily activities without incident.  If any biopsies were taken you will be contacted by phone or by letter within the next 1-3 weeks.  Please call us at [phone] if you have not heard about the biopsies in 3 weeks.    SIGNATURES/CONFIDENTIALITY: You and/or your care partner have signed paperwork which will be entered into your electronic medical record.  These signatures attest to the fact that that the information above on your After Visit Summary has been reviewed  and is understood.  Full responsibility of the confidentiality of this discharge information lies with you and/or your care-partner. 

## 2018-11-17 NOTE — Progress Notes (Signed)
PT taken to PACU. Monitors in place. VSS. Report given to RN. 

## 2018-11-18 ENCOUNTER — Telehealth: Payer: Self-pay

## 2018-11-18 NOTE — Telephone Encounter (Signed)
  Follow up Call-  Call back number 11/17/2018  Post procedure Call Back phone  # [phone]  Permission to leave phone message Yes  Some recent data might be hidden     Patient questions:  Do you have a fever, pain , or abdominal swelling? No. Pain Score  0 *  Have you tolerated food without any problems? Yes.    Have you been able to return to your normal activities? Yes.    Do you have any questions about your discharge instructions: Diet   No. Medications  No. Follow up visit  No.  Do you have questions or concerns about your Care? No.  Actions: * If pain score is 4 or above: No action needed, pain <4.

## 2018-12-06 ENCOUNTER — Other Ambulatory Visit: Payer: Self-pay | Admitting: Family Medicine

## 2018-12-06 DIAGNOSIS — G4700 Insomnia, unspecified: Secondary | ICD-10-CM

## 2018-12-07 NOTE — Telephone Encounter (Signed)
Electronic refill request Alprazolam Last refill 02/19/18 #30/2 Last office visit 10/26/18 acute

## 2019-01-12 ENCOUNTER — Other Ambulatory Visit: Payer: Self-pay | Admitting: Family Medicine

## 2019-01-29 ENCOUNTER — Other Ambulatory Visit: Payer: Self-pay | Admitting: Family Medicine

## 2019-01-29 DIAGNOSIS — I10 Essential (primary) hypertension: Secondary | ICD-10-CM

## 2019-01-29 DIAGNOSIS — N183 Chronic kidney disease, stage 3 unspecified: Secondary | ICD-10-CM

## 2019-01-29 DIAGNOSIS — E785 Hyperlipidemia, unspecified: Secondary | ICD-10-CM

## 2019-01-31 MED ORDER — BENAZEPRIL HCL 10 MG PO TABS: 10.0000 mg | ORAL_TABLET | Freq: Every day | ORAL | 1 refills | Status: DC

## 2019-01-31 NOTE — Telephone Encounter (Signed)
Does patient need to make a virtual visit and labs prior? Certain time frame for appointments? Refill request for Atorvastatin and Benazepril. Please review.

## 2019-01-31 NOTE — Telephone Encounter (Signed)
Patient needs follow up of anemia. Please call her and se if she is able to come by for labs and do virtual visit.

## 2019-02-01 ENCOUNTER — Other Ambulatory Visit: Payer: Medicare Other

## 2019-02-02 ENCOUNTER — Other Ambulatory Visit: Payer: Self-pay | Admitting: Family Medicine

## 2019-02-02 ENCOUNTER — Other Ambulatory Visit (INDEPENDENT_AMBULATORY_CARE_PROVIDER_SITE_OTHER): Payer: Medicare Other

## 2019-02-02 DIAGNOSIS — D539 Nutritional anemia, unspecified: Secondary | ICD-10-CM

## 2019-02-02 DIAGNOSIS — N183 Chronic kidney disease, stage 3 unspecified: Secondary | ICD-10-CM

## 2019-02-02 LAB — CBC WITH DIFFERENTIAL/PLATELET
Basophils Absolute: 0.1 10*3/uL (ref 0.0–0.1)
Basophils Relative: 1.3 % (ref 0.0–3.0)
Eosinophils Absolute: 0.3 10*3/uL (ref 0.0–0.7)
Eosinophils Relative: 4.6 % (ref 0.0–5.0)
HCT: 34 % — ABNORMAL LOW (ref 36.0–46.0)
Hemoglobin: 11.3 g/dL — ABNORMAL LOW (ref 12.0–15.0)
Lymphocytes Relative: 20.6 % (ref 12.0–46.0)
Lymphs Abs: 1.1 10*3/uL (ref 0.7–4.0)
MCHC: 33.2 g/dL (ref 30.0–36.0)
MCV: 99.4 fl (ref 78.0–100.0)
Monocytes Absolute: 0.6 10*3/uL (ref 0.1–1.0)
Monocytes Relative: 10.1 % (ref 3.0–12.0)
Neutro Abs: 3.5 10*3/uL (ref 1.4–7.7)
Neutrophils Relative %: 63.4 % (ref 43.0–77.0)
Platelets: 211 10*3/uL (ref 150.0–400.0)
RBC: 3.42 Mil/uL — ABNORMAL LOW (ref 3.87–5.11)
RDW: 12.5 % (ref 11.5–15.5)
WBC: 5.5 10*3/uL (ref 4.0–10.5)

## 2019-02-02 LAB — BASIC METABOLIC PANEL
BUN: 29 mg/dL — ABNORMAL HIGH (ref 6–23)
CO2: 28 mEq/L (ref 19–32)
Calcium: 8.9 mg/dL (ref 8.4–10.5)
Chloride: 107 mEq/L (ref 96–112)
Creatinine, Ser: 1.23 mg/dL — ABNORMAL HIGH (ref 0.40–1.20)
GFR: 42.35 mL/min — ABNORMAL LOW (ref >60.00)
Glucose, Bld: 100 mg/dL — ABNORMAL HIGH (ref 70–99)
Potassium: 5 mEq/L (ref 3.5–5.1)
Sodium: 142 mEq/L (ref 135–145)

## 2019-02-07 ENCOUNTER — Ambulatory Visit (INDEPENDENT_AMBULATORY_CARE_PROVIDER_SITE_OTHER): Payer: Medicare Other | Admitting: Family Medicine

## 2019-02-07 ENCOUNTER — Encounter: Payer: Self-pay | Admitting: Family Medicine

## 2019-02-07 VITALS — BP 133/70 | HR 71 | Ht 62.0 in | Wt 154.0 lb

## 2019-02-07 DIAGNOSIS — N183 Chronic kidney disease, stage 3 unspecified: Secondary | ICD-10-CM

## 2019-02-07 DIAGNOSIS — I10 Essential (primary) hypertension: Secondary | ICD-10-CM | POA: Diagnosis not present

## 2019-02-07 DIAGNOSIS — D631 Anemia in chronic kidney disease: Secondary | ICD-10-CM | POA: Diagnosis not present

## 2019-02-07 DIAGNOSIS — F5104 Psychophysiologic insomnia: Secondary | ICD-10-CM

## 2019-02-07 NOTE — Patient Instructions (Signed)
Hi Carla Lyons,  It was good to talk with you today. I am also sending this information to you through your Mychart account. I thought it would be good to have a printed copy. You can try a low dose melatonin 2-3 mg. Take 1-2 hours before bedtime.  Please schedule a follow up visit with me in 5-6 months but call if you need anything in the meantime.  Take care and be well,  Tor Netters, FNP-BC   Insomnia Insomnia is a sleep disorder that makes it difficult to fall asleep or stay asleep. Insomnia can cause fatigue, low energy, difficulty concentrating, mood swings, and poor performance at work or school. There are three different ways to classify insomnia:  Difficulty falling asleep.  Difficulty staying asleep.  Waking up too early in the morning. Any type of insomnia can be long-term (chronic) or short-term (acute). Both are common. Short-term insomnia usually lasts for three months or less. Chronic insomnia occurs at least three times a week for longer than three months. What are the causes? Insomnia may be caused by another condition, situation, or substance, such as:  Anxiety.  Certain medicines.  Gastroesophageal reflux disease (GERD) or other gastrointestinal conditions.  Asthma or other breathing conditions.  Restless legs syndrome, sleep apnea, or other sleep disorders.  Chronic pain.  Menopause.  Stroke.  Abuse of alcohol, tobacco, or illegal drugs.  Mental health conditions, such as depression.  Caffeine.  Neurological disorders, such as Alzheimer's disease.  An overactive thyroid (hyperthyroidism). Sometimes, the cause of insomnia may not be known. What increases the risk? Risk factors for insomnia include:  Gender. Women are affected more often than men.  Age. Insomnia is more common as you get older.  Stress.  Lack of exercise.  Irregular work schedule or working night shifts.  Traveling between different time zones.  Certain medical and mental  health conditions. What are the signs or symptoms? If you have insomnia, the main symptom is having trouble falling asleep or having trouble staying asleep. This may lead to other symptoms, such as:  Feeling fatigued or having low energy.  Feeling nervous about going to sleep.  Not feeling rested in the morning.  Having trouble concentrating.  Feeling irritable, anxious, or depressed. How is this diagnosed? This condition may be diagnosed based on:  Your symptoms and medical history. Your health care provider may ask about: ? Your sleep habits. ? Any medical conditions you have. ? Your mental health.  A physical exam. How is this treated? Treatment for insomnia depends on the cause. Treatment may focus on treating an underlying condition that is causing insomnia. Treatment may also include:  Medicines to help you sleep.  Counseling or therapy.  Lifestyle adjustments to help you sleep better. Follow these instructions at home: Eating and drinking   Limit or avoid alcohol, caffeinated beverages, and cigarettes, especially close to bedtime. These can disrupt your sleep.  Do not eat a large meal or eat spicy foods right before bedtime. This can lead to digestive discomfort that can make it hard for you to sleep. Sleep habits   Keep a sleep diary to help you and your health care provider figure out what could be causing your insomnia. Write down: ? When you sleep. ? When you wake up during the night. ? How well you sleep. ? How rested you feel the next day. ? Any side effects of medicines you are taking. ? What you eat and drink.  Make your bedroom a dark, comfortable  place where it is easy to fall asleep. ? Put up shades or blackout curtains to block light from outside. ? Use a white noise machine to block noise. ? Keep the temperature cool.  Limit screen use before bedtime. This includes: ? Watching TV. ? Using your smartphone, tablet, or computer.  Stick to a  routine that includes going to bed and waking up at the same times every day and night. This can help you fall asleep faster. Consider making a quiet activity, such as reading, part of your nighttime routine.  Try to avoid taking naps during the day so that you sleep better at night.  Get out of bed if you are still awake after 15 minutes of trying to sleep. Keep the lights down, but try reading or doing a quiet activity. When you feel sleepy, go back to bed. General instructions  Take over-the-counter and prescription medicines only as told by your health care provider.  Exercise regularly, as told by your health care provider. Avoid exercise starting several hours before bedtime.  Use relaxation techniques to manage stress. Ask your health care provider to suggest some techniques that may work well for you. These may include: ? Breathing exercises. ? Routines to release muscle tension. ? Visualizing peaceful scenes.  Make sure that you drive carefully. Avoid driving if you feel very sleepy.  Keep all follow-up visits as told by your health care provider. This is important. Contact a health care provider if:  You are tired throughout the day.  You have trouble in your daily routine due to sleepiness.  You continue to have sleep problems, or your sleep problems get worse. Get help right away if:  You have serious thoughts about hurting yourself or someone else. If you ever feel like you may hurt yourself or others, or have thoughts about taking your own life, get help right away. You can go to your nearest emergency department or call:  Your local emergency services (911 in the [place]  A suicide crisis helpline, such as the [facility] at 412-040-9737. This is open 24 hours a day. Summary  Insomnia is a sleep disorder that makes it difficult to fall asleep or stay asleep.  Insomnia can be long-term (chronic) or short-term (acute).  Treatment for  insomnia depends on the cause. Treatment may focus on treating an underlying condition that is causing insomnia.  Keep a sleep diary to help you and your health care provider figure out what could be causing your insomnia. This information is not intended to replace advice given to you by your health care provider. Make sure you discuss any questions you have with your health care provider. Document Released: 09/19/2000 Document Revised: 07/02/2017 Document Reviewed: 07/02/2017 Elsevier Interactive Patient Education  2019 [employer].

## 2019-02-07 NOTE — Progress Notes (Signed)
Virtual Visit via Telephone Note  I connected with Carla Lyons on 02/07/19 at  9:00 AM EDT by telephone and verified that I am speaking with the correct person using two identifiers.  Location: Patient: at her home Provider: [place]   I discussed the limitations, risks, security and privacy concerns of performing an evaluation and management service by telephone and the availability of in person appointments. I also discussed with the patient that there may be a patient responsible charge related to this service. The patient expressed understanding and agreed to proceed.   History of Present Illness: This is a 78 year old female who agrees to virtual visit for follow-up of chronic medical conditions.  Anemia- she has history of iron deficiency anemia as well as chronic kidney disease and is currently on iron supplementation.  She had blood work prior to this virtual visit.  Hemoglobin is improved.  Hypertension- patient currently on benazepril and as needed diuretic.  Home blood pressure readings within normal limits.  She has occasional swelling of her left foot and ankle that goes down overnight.  She does not have shortness of breath, chest pain or cough.  Chronic kidney disease-BUN and creatinine stable, follow-up on file with nephrology in July.  Insomnia- she has noticed more difficulty going to sleep and staying asleep during coronavirus pandemic.  She thinks that it is related to increased stress.  Sometimes she will only sleep 3 to 4 hours at night and then finds herself napping during the day.  She occasionally takes alprazolam 25 mg 1/2 tablet.  She gets a good night sleep with this but it leaves her feeling groggy the next day.  She has not tried anything else.  Past Medical History:  Diagnosis Date  . Allergy   . Anemia   . 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 Colon polyps 06-15-07  . Diverticulosis    hx of  . Hyperlipidemia   . Hypertension   .  Osteoporosis   . Personal history of colonic adenomas 07/19/2007  . Renal insufficiency   . Thyroid disease    Past Surgical History:  Procedure Laterality Date  . APPENDECTOMY    . CATARACT EXTRACTION W/ INTRAOCULAR LENS IMPLANT Right 01/08/2018  . CATARACT EXTRACTION W/ INTRAOCULAR LENS IMPLANT Left 02/05/2018  . CESAREAN SECTION     x 2  . PARTIAL HYSTERECTOMY  age 4  . VAGINAL HYSTERECTOMY  age 5   partial   Family History  Problem Relation Age of Onset  . Coronary artery disease Mother   . Other Mother        bypass surgery  . Dementia Mother   . Osteoarthritis Father   . Heart disease Father   . Heart attack Maternal Grandfather   . Lung disease Brother   . Hypertension Brother   . Lung disease Sister   . Lung disease Sister   . Colon cancer Neg Hx   . Esophageal cancer Neg Hx   . Rectal cancer Neg Hx   . Stomach cancer Neg Hx    Social History   Tobacco Use  . Smoking status: Former Smoker    Last attempt to quit: 10/07/2001    Years since quitting: 17.3  . Smokeless tobacco: Never Used  Substance Use Topics  . Alcohol use: Yes    Alcohol/week: 2.0 - 3.0 standard drinks    Types: 2 - 3 Glasses of wine per week    Comment: rare  . Drug use: No  Observations/Objective: Patient is alert questions appropriately.  She is normally conversive without obvious shortness of breath.  Mood and affect are appropriate.  BP 133/70 Comment: per patient  Pulse 71 Comment: per patient  Ht 5\' 2"  (1.575 m)   Wt 154 lb (69.9 kg) Comment: per patient  BMI 28.17 kg/m  Wt Readings from Last 3 Encounters:  02/07/19 154 lb (69.9 kg)  11/17/18 157 lb (71.2 kg)  11/05/18 157 lb 6.4 oz (71.4 kg)    Assessment and Plan: 1. Anemia in stage 3 chronic kidney disease (HCC) -Improved blood counts, continue iron supplementation -She will have labs done with nephrology in a couple of months and follow-up with me in 6 months  2. Essential hypertension -Well-controlled on  current medications -Follow-up in 6 months  3. CKD (chronic kidney disease), stage III (HCC) -Stable kidney function continued follow-up with nephrology  4. Psychophysiological insomnia -Seems situational in nature hopefully will improve as pandemic situation lessens -Information sent via my chart as well as in the mail regarding sleep hygiene and trying some low-dose melatonin   Clarene Reamer, FNP-BC  [facility] [place]  02/07/2019 9:30 AM   Follow Up Instructions:    I discussed the assessment and treatment plan with the patient. The patient was provided an opportunity to ask questions and all were answered. The patient agreed with the plan and demonstrated an understanding of the instructions.   The patient was advised to call back or seek an in-person evaluation if the symptoms worsen or if the condition fails to improve as anticipated.   Elby Beck, FNP

## 2019-03-08 LAB — HM MAMMOGRAPHY

## 2019-04-01 ENCOUNTER — Other Ambulatory Visit: Payer: Self-pay

## 2019-04-01 ENCOUNTER — Ambulatory Visit: Payer: Self-pay | Admitting: *Deleted

## 2019-04-01 ENCOUNTER — Encounter: Payer: Self-pay | Admitting: Emergency Medicine

## 2019-04-01 ENCOUNTER — Emergency Department
Admission: EM | Admit: 2019-04-01 | Discharge: 2019-04-01 | Disposition: A | Discharge status: Home / Self Care | Payer: Medicare Other | Attending: Emergency Medicine | Admitting: Emergency Medicine

## 2019-04-01 DIAGNOSIS — E039 Hypothyroidism, unspecified: Secondary | ICD-10-CM | POA: Insufficient documentation

## 2019-04-01 DIAGNOSIS — R531 Weakness: Secondary | ICD-10-CM | POA: Diagnosis not present

## 2019-04-01 DIAGNOSIS — I129 Hypertensive chronic kidney disease with stage 1 through stage 4 chronic kidney disease, or unspecified chronic kidney disease: Secondary | ICD-10-CM | POA: Diagnosis not present

## 2019-04-01 DIAGNOSIS — N183 Chronic kidney disease, stage 3 (moderate): Secondary | ICD-10-CM | POA: Diagnosis not present

## 2019-04-01 DIAGNOSIS — R42 Dizziness and giddiness: Secondary | ICD-10-CM

## 2019-04-01 DIAGNOSIS — Z87891 Personal history of nicotine dependence: Secondary | ICD-10-CM | POA: Insufficient documentation

## 2019-04-01 LAB — BASIC METABOLIC PANEL
Anion gap: 7 (ref 5–15)
BUN: 28 mg/dL — ABNORMAL HIGH (ref 8–23)
CO2: 27 mmol/L (ref 22–32)
Calcium: 9.3 mg/dL (ref 8.9–10.3)
Chloride: 106 mmol/L (ref 98–111)
Creatinine, Ser: 1.25 mg/dL — ABNORMAL HIGH (ref 0.44–1.00)
GFR calc Af Amer: 48 mL/min — ABNORMAL LOW (ref >60)
GFR calc non Af Amer: 41 mL/min — ABNORMAL LOW (ref >60)
Glucose, Bld: 98 mg/dL (ref 70–99)
Potassium: 5.1 mmol/L (ref 3.5–5.1)
Sodium: 140 mmol/L (ref 135–145)

## 2019-04-01 LAB — URINALYSIS, COMPLETE (UACMP) WITH MICROSCOPIC
Bacteria, UA: NONE SEEN
Bilirubin Urine: NEGATIVE
Glucose, UA: NEGATIVE mg/dL
Hgb urine dipstick: NEGATIVE
Ketones, ur: NEGATIVE mg/dL
Leukocytes,Ua: NEGATIVE
Nitrite: NEGATIVE
Protein, ur: NEGATIVE mg/dL
Specific Gravity, Urine: 1.01 (ref 1.005–1.030)
pH: 5 (ref 5.0–8.0)

## 2019-04-01 LAB — CBC
HCT: 35.1 % — ABNORMAL LOW (ref 36.0–46.0)
Hemoglobin: 11 g/dL — ABNORMAL LOW (ref 12.0–15.0)
MCH: 32.4 pg (ref 26.0–34.0)
MCHC: 31.3 g/dL (ref 30.0–36.0)
MCV: 103.2 fL — ABNORMAL HIGH (ref 80.0–100.0)
Platelets: 200 10*3/uL (ref 150–400)
RBC: 3.4 MIL/uL — ABNORMAL LOW (ref 3.87–5.11)
RDW: 12.3 % (ref 11.5–15.5)
WBC: 5.6 10*3/uL (ref 4.0–10.5)
nRBC: 0 % (ref 0.0–0.2)

## 2019-04-01 MED ORDER — ACETAMINOPHEN 325 MG PO TABS
650.0000 mg | ORAL_TABLET | Freq: Once | ORAL | Status: AC
  Administered 2019-04-01: 650 mg via ORAL
  Filled 2019-04-01: qty 2

## 2019-04-01 MED ORDER — SODIUM CHLORIDE 0.9 % IV SOLN
Freq: Once | INTRAVENOUS | Status: AC
  Administered 2019-04-01: 15:00:00 via INTRAVENOUS

## 2019-04-01 NOTE — Telephone Encounter (Signed)
Noted  

## 2019-04-01 NOTE — Telephone Encounter (Signed)
Pt called in c/o being very weak and dizzy.   "I can't hardly walk because I'm so wobbly on my feet".   I also have a headache on the right side of my head.   I've never hah and  Headache like this.  I referred her to the ED.   I encouraged her to call 911 due to her being so weak and dizzy.   She's going to call her neighbor or daughter and if they can't take her she will call 911 to take her to [facility].  I routed these notes to [facility] for Clarene Reamer, [place]    Reason for Disposition . SEVERE dizziness (e.g., unable to stand, requires support to walk, feels like passing out now)  Answer Assessment - Initial Assessment Questions 1. DESCRIPTION: "Describe your dizziness."     I'm real dizzy.  I can't hardly walk.   I have a headache on the right side of my head.  I have pain in my neck and shoulder for a long time that is getting worse.    I worked outside yesterday but I drank [employer].   This morning I feel so weak.   I feel like I can't walk because I'm so weak. 2. LIGHTHEADED: "Do you feel lightheaded?" (e.g., somewhat faint, woozy, weak upon standing)     Yes   I feel really bad this morning and I'm wobbly and very weak.   I'm afraid to get into the shower because I'm so unsteady.   3. VERTIGO: "Do you feel like either you or the room is spinning or tilting?" (i.e. vertigo)     No 4. SEVERITY: "How bad is it?"  "Do you feel like you are going to faint?" "Can you stand and walk?"   - MILD - walking normally   - MODERATE - interferes with normal activities (e.g., work, school)    - SEVERE - unable to stand, requires support to walk, feels like passing out now.      Yes   Difficulty walking due to weakness.        5. ONSET:  "When did the dizziness begin?"     I did not feel good last night when I went to bed.   I was very tired and lightheaded last night.    This morning it's really bad. 6. AGGRAVATING FACTORS: "Does anything make it worse?" (e.g., standing, change in head  position)     Walking around 7. HEART RATE: "Can you tell me your heart rate?" "How many beats in 15 seconds?"  (Note: not all patients can do this)       Not asked 8. CAUSE: "What do you think is causing the dizziness?"     Maybe working in the yard.   I may be dehydrated but I drank water and Gator Aid. 9. RECURRENT SYMPTOM: "Have you had dizziness before?" If so, ask: "When was the last time?" "What happened that time?"     No 10. OTHER SYMPTOMS: "Do you have any other symptoms?" (e.g., fever, chest pain, vomiting, diarrhea, bleeding)       I live by myself.   11. PREGNANCY: "Is there any chance you are pregnant?" "When was your last menstrual period?"       N/A due to age  Protocols used: DIZZINESS [facility]
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
## 2019-04-01 NOTE — ED Triage Notes (Signed)
Pt to ED from home c/o dizziness and generalized weakness that started when going to bed last night and when woke up.  Also c/o right sided headache.  Denies falls or injuries recently.  Pt concerned about being dehydrated.  Pt A&Ox4, speaking in complete and coherent sentences, chest rise even and unlabored, in NAD at this time.

## 2019-04-11 ENCOUNTER — Encounter: Payer: Self-pay | Admitting: Family Medicine

## 2019-04-11 ENCOUNTER — Other Ambulatory Visit: Payer: Self-pay | Admitting: Family Medicine

## 2019-04-11 ENCOUNTER — Ambulatory Visit (INDEPENDENT_AMBULATORY_CARE_PROVIDER_SITE_OTHER)
Admission: RE | Admit: 2019-04-11 | Discharge: 2019-04-11 | Disposition: A | Discharge status: Home / Self Care | Payer: Medicare Other | Source: Ambulatory Visit | Attending: Family Medicine | Admitting: Family Medicine

## 2019-04-11 ENCOUNTER — Other Ambulatory Visit: Payer: Self-pay

## 2019-04-11 ENCOUNTER — Ambulatory Visit (INDEPENDENT_AMBULATORY_CARE_PROVIDER_SITE_OTHER): Payer: Medicare Other | Admitting: Family Medicine

## 2019-04-11 VITALS — BP 138/84 | HR 102 | Temp 98.0°F | Wt 162.0 lb

## 2019-04-11 DIAGNOSIS — M542 Cervicalgia: Secondary | ICD-10-CM

## 2019-04-11 DIAGNOSIS — I6521 Occlusion and stenosis of right carotid artery: Secondary | ICD-10-CM

## 2019-04-11 DIAGNOSIS — R42 Dizziness and giddiness: Secondary | ICD-10-CM | POA: Diagnosis not present

## 2019-04-11 MED ORDER — METHOCARBAMOL 500 MG PO TABS: 500.0000 mg | ORAL_TABLET | Freq: Two times a day (BID) | ORAL | 1 refills | Status: DC | PRN

## 2019-04-11 NOTE — Patient Instructions (Signed)
Good to see you today  Go to xray, I will call you when I get the report  Do gentle neck stretches at least once a day

## 2019-04-11 NOTE — Progress Notes (Signed)
Subjective:    Patient ID: Carla Lyons, female    DOB: January 16, 1941, 78 y.o.   MRN: [id number]  HPI This is a 78 yo female who presents today for follow up of er visit 04/01/19. She went in with dizziness, weakness. Labs, ekg ok. She was given fluids and discharged home. Dizziness resolved. She thinks related to mild dehydration.   Right sided neck pain for several months, worse at night and in the mornings. Some burning into shoulder.  Pain is achy, radiates up side of head and causes dull headache. Takes tylenol 2 tabs 2-3 times a day. Some relief with tylenol and heat. No numbness, tingling or weakness, no known trauma.   Drinks diet green tea, regular unsweetened tea, some gatorade when working outside.   Poor sleep. A lot on her mind at night. No help with melatonin. Sometimes can't get comfortable with neck.   Past Medical History:  Diagnosis Date  . Allergy   . Anemia   . 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 Colon polyps 06-15-07  . Diverticulosis    hx of  . Hyperlipidemia   . Hypertension   . Osteoporosis   . Personal history of colonic adenomas 07/19/2007  . Renal insufficiency   . Thyroid disease    Past Surgical History:  Procedure Laterality Date  . APPENDECTOMY    . CATARACT EXTRACTION W/ INTRAOCULAR LENS IMPLANT Right 01/08/2018  . CATARACT EXTRACTION W/ INTRAOCULAR LENS IMPLANT Left 02/05/2018  . CESAREAN SECTION     x 2  . PARTIAL HYSTERECTOMY  age 50  . VAGINAL HYSTERECTOMY  age 31   partial   Family History  Problem Relation Age of Onset  . Coronary artery disease Mother   . Other Mother        bypass surgery  . Dementia Mother   . Osteoarthritis Father   . Heart disease Father   . Heart attack Maternal Grandfather   . Lung disease Brother   . Hypertension Brother   . Lung disease Sister   . Lung disease Sister   . Colon cancer Neg Hx   . Esophageal cancer Neg Hx   . Rectal cancer Neg Hx   . Stomach cancer Neg Hx    Social  History   Tobacco Use  . Smoking status: Former Smoker    Quit date: 10/07/2001    Years since quitting: 17.5  . Smokeless tobacco: Never Used  Substance Use Topics  . Alcohol use: Yes    Alcohol/week: 2.0 - 3.0 standard drinks    Types: 2 - 3 Glasses of wine per week    Comment: rare  . Drug use: No      Review of Systems Per HPI    Objective:   Physical Exam Vitals signs reviewed.  Constitutional:      General: She is not in acute distress.    Appearance: Normal appearance. She is normal weight. She is not ill-appearing, toxic-appearing or diaphoretic.  HENT:     Head: Normocephalic and atraumatic.  Eyes:     Conjunctiva/sclera: Conjunctivae normal.  Neck:     Musculoskeletal: Decreased range of motion (rotation to right slightly decreased, good flexion, extension. ). Muscular tenderness (rightright paraspinal) present.  Cardiovascular:     Rate and Rhythm: Normal rate.  Pulmonary:     Effort: Pulmonary effort is normal.  Skin:    General: Skin is warm and dry.  Neurological:     Mental Status: She is alert and  oriented to person, place, and time.  Psychiatric:        Mood and Affect: Mood normal.        Behavior: Behavior normal.        Thought Content: Thought content normal.        Judgment: Judgment normal.        BP 138/84   Pulse (!) 102   Temp 98 F (36.7 C) (Oral)   Wt 162 lb (73.5 kg)   SpO2 97%   BMI 28.70 kg/m  Wt Readings from Last 3 Encounters:  04/11/19 162 lb (73.5 kg)  04/01/19 162 lb (73.5 kg)  02/07/19 154 lb (69.9 kg)       Assessment & Plan:  1. Cervical pain - will check xray, continue Tylenol 2 tabs 2-3 times per day, will add methocarbamol bid prn, gentle ROM exercises - DG Cervical Spine Complete; Future - methocarbamol (ROBAXIN) 500 MG tablet; Take 1 tablet (500 mg total) by mouth 2 (two) times daily as needed for muscle spasms.  Dispense: 30 tablet; Refill: 1  2. Dizziness - resolved, likely mild dehydration, encouraged  patient to increase fluids  - follow up depending on results of xray   Clarene Reamer, FNP-BC  [facility] at [facility], [place]  04/11/2019 9:49 AM

## 2019-04-12 ENCOUNTER — Other Ambulatory Visit: Payer: Self-pay | Admitting: Family Medicine

## 2019-04-12 DIAGNOSIS — M47812 Spondylosis without myelopathy or radiculopathy, cervical region: Secondary | ICD-10-CM

## 2019-04-12 DIAGNOSIS — G542 Cervical root disorders, not elsewhere classified: Secondary | ICD-10-CM

## 2019-04-12 DIAGNOSIS — M542 Cervicalgia: Secondary | ICD-10-CM

## 2019-04-14 LAB — BASIC METABOLIC PANEL
BUN: 27 — AB (ref 4–21)
CO2: 28 — AB (ref 13–22)
Chloride: 106 (ref 99–108)
Creatinine: 1.3 — AB (ref 0.5–1.1)
Potassium: 5 (ref 3.4–5.3)
Sodium: 141 (ref 137–147)

## 2019-04-14 LAB — CBC AND DIFFERENTIAL
HCT: 33 — AB (ref 36–46)
Hemoglobin: 11 — AB (ref 12.0–16.0)
Platelets: 209 (ref 150–399)
WBC: 5.6

## 2019-04-14 LAB — COMPREHENSIVE METABOLIC PANEL: Albumin: 4 (ref 3.5–5.0)

## 2019-04-25 ENCOUNTER — Ambulatory Visit (INDEPENDENT_AMBULATORY_CARE_PROVIDER_SITE_OTHER): Payer: Medicare Other

## 2019-04-25 ENCOUNTER — Other Ambulatory Visit: Payer: Self-pay

## 2019-04-25 DIAGNOSIS — I6521 Occlusion and stenosis of right carotid artery: Secondary | ICD-10-CM

## 2019-04-27 ENCOUNTER — Telehealth: Payer: Self-pay | Admitting: Family Medicine

## 2019-04-27 NOTE — Telephone Encounter (Signed)
Patent stated she had an ultrasound on Monday 7/20. She stated she still has not heard back from anyone and would like to know if you are able to look into this for her.   Patient also stated that June 2nd she had a mammogram and needs this update in her mychart, right now it stays she is due for this but has already had it done.    C/B # [phone]

## 2019-04-27 NOTE — Telephone Encounter (Signed)
A result note for the test was already sent to [place]. Mammogram status will be updated.

## 2019-05-24 ENCOUNTER — Other Ambulatory Visit: Payer: Self-pay | Admitting: Family Medicine

## 2019-05-24 DIAGNOSIS — G4700 Insomnia, unspecified: Secondary | ICD-10-CM

## 2019-05-24 NOTE — Telephone Encounter (Signed)
Last office visit 04/11/2019 for hospital follow up.  Last refilled 12/07/2018 for #30 with 1 refill.  No future appointments.

## 2019-05-31 ENCOUNTER — Encounter: Payer: Self-pay | Admitting: Family Medicine

## 2019-07-29 ENCOUNTER — Other Ambulatory Visit: Payer: Self-pay | Admitting: Family Medicine

## 2019-07-29 DIAGNOSIS — E785 Hyperlipidemia, unspecified: Secondary | ICD-10-CM

## 2019-08-02 ENCOUNTER — Encounter: Payer: Self-pay | Admitting: Family Medicine

## 2019-08-02 ENCOUNTER — Other Ambulatory Visit: Payer: Self-pay

## 2019-08-02 ENCOUNTER — Ambulatory Visit (INDEPENDENT_AMBULATORY_CARE_PROVIDER_SITE_OTHER): Payer: Medicare Other | Admitting: Family Medicine

## 2019-08-02 VITALS — BP 128/70 | HR 65 | Temp 97.8°F | Ht 63.0 in | Wt 158.1 lb

## 2019-08-02 DIAGNOSIS — R2689 Other abnormalities of gait and mobility: Secondary | ICD-10-CM

## 2019-08-02 DIAGNOSIS — M79605 Pain in left leg: Secondary | ICD-10-CM | POA: Diagnosis not present

## 2019-08-02 DIAGNOSIS — I872 Venous insufficiency (chronic) (peripheral): Secondary | ICD-10-CM

## 2019-08-02 DIAGNOSIS — N1832 Chronic kidney disease, stage 3b: Secondary | ICD-10-CM | POA: Diagnosis not present

## 2019-08-02 DIAGNOSIS — R0989 Other specified symptoms and signs involving the circulatory and respiratory systems: Secondary | ICD-10-CM

## 2019-08-02 NOTE — Patient Instructions (Signed)
I think you may have left sided sciatica along with chronic venous insufficiency.  Look at exercises provided today.  I do want to check arterial circulation evaluation to ensure pain not caused by artery blockage in the legs. We will also check this prior to starting thigh high compression stockings.  For imbalance, continue cane use and we will refer you to outpatient physical therapy for fall prevention and balance training program.  Let us know if not improving with above.

## 2019-08-02 NOTE — Progress Notes (Signed)
This visit was conducted in person.  BP 128/70 (BP Location: Left Arm, Patient Position: Sitting, Cuff Size: Normal)   Pulse 65   Temp 97.8 F (36.6 C) (Temporal)   Ht 5\' 3"  (1.6 m)   Wt 158 lb 2 oz (71.7 kg)   SpO2 99%   BMI 28.01 kg/m   Orthostatic VS for the past 24 hrs (Last 3 readings):  BP- Lying BP- Standing at 0 minutes  08/02/19 1012 - 136/80  08/02/19 1010 136/76 -    CC: leg pain Subjective:    Patient ID: Carla Lyons, female    DOB: 06/27/1941, 78 y.o.   MRN: [id number]  HPI: Carla Lyons is a 78 y.o. female presenting on 08/02/2019 for Leg Pain (C/o bilateral leg pain, worse in left.  C/o occasional sharp pain in medial side of left knee. Started 3-4 mos ago. Tried Tyelnol and walking. )   3-4 mo h/o bilateral leg pain L>R. Seems to be progressively worsening. Describes new sharp intermittent pain at L medial knee as well as more chronic lateral knee pain radiation up to hip, associated with chronic L leg swelling. Leg can get itchy and hot but not red. Some R medial knee pain but not as bad. She did notice tender broken blood vessels medial L knee. Pain worst when she awakens in the morning, better at night. Worsening imbalance noted which is distressing, started using cane for the past few weeks. Notices worse imbalance when she first stands up from seated position - after a few minutes this can improve.   No dizziness, lightheadedness or presyncope.  No lower back pain.  No fevers/chills, inciting trauma, numbness or weakness of legs, saddle anesthesia, or bowel/bladder incontinence. No foot paresthesias.  Walks 30 min/day on treadmill, sometimes 2-3 times a day. No significant leg pain or imbalance when she's walking on treadmill... She continues dissolvable sublingual b12 supplement daily.  Lab Results  Component Value Date   VITAMINB12 >1525 (H) 10/26/2018        Relevant past medical, surgical, family and social history reviewed and updated as indicated.  Interim medical history since our last visit reviewed. Allergies and medications reviewed and updated. Outpatient Medications Prior to Visit  Medication Sig Dispense Refill  . ALPRAZolam (XANAX) 0.25 MG tablet TAKE 1 TABLET (0.25 MG TOTAL) BY MOUTH AT BEDTIME. 30 tablet 1  . aspirin (ADULT ASPIRIN EC LOW STRENGTH) 81 MG EC tablet Take 81 mg by mouth daily.     Marland Kitchen atorvastatin (LIPITOR) 20 MG tablet TAKE 1 TABLET BY MOUTH EVERY DAY 90 tablet 0  . b complex vitamins tablet Take 1 tablet by mouth daily.    . benazepril (LOTENSIN) 10 MG tablet Take 1 tablet (10 mg total) by mouth daily. 90 tablet 1  . BIOTIN 5000 PO Take 10,000 mcg by mouth daily.    . cholecalciferol (VITAMIN D) 1000 UNITS tablet Take 1,000 Units by mouth 2 (two) times daily.     Marland Kitchen FERROUS SULFATE PO Take 1 tablet by mouth daily. 5 grams.  Take one tablet at bedtime with orange juice    . furosemide (LASIX) 20 MG tablet Take 20 mg by mouth as needed for edema.    . hydrochlorothiazide (HYDRODIURIL) 12.5 MG tablet as needed.     Marland Kitchen levothyroxine (SYNTHROID, LEVOTHROID) 88 MCG tablet TAKE 1 TABLET BY MOUTH EVERY DAY 90 tablet 2  . methocarbamol (ROBAXIN) 500 MG tablet Take 1 tablet (500 mg total) by mouth 2 (two) times daily  as needed for muscle spasms. 30 tablet 1  . Omega-3 Fatty Acids (FISH OIL) 1000 MG CAPS Take by mouth. Take two daily     . vitamin B-12 (CYANOCOBALAMIN) 1000 MCG tablet Take 1,000 mcg by mouth daily. Take one daily SL.     No facility-administered medications prior to visit.      Per HPI unless specifically indicated in ROS section below Review of Systems Objective:    BP 128/70 (BP Location: Left Arm, Patient Position: Sitting, Cuff Size: Normal)   Pulse 65   Temp 97.8 F (36.6 C) (Temporal)   Ht 5\' 3"  (1.6 m)   Wt 158 lb 2 oz (71.7 kg)   SpO2 99%   BMI 28.01 kg/m   Wt Readings from Last 3 Encounters:  08/02/19 158 lb 2 oz (71.7 kg)  04/11/19 162 lb (73.5 kg)  04/01/19 162 lb (73.5 kg)     Physical Exam Vitals signs and nursing note reviewed.  Constitutional:      General: She is not in acute distress.    Appearance: Normal appearance. She is obese. She is not ill-appearing.  HENT:     Head: Normocephalic and atraumatic.     Mouth/Throat:     Mouth: Mucous membranes are moist.     Pharynx: Oropharynx is clear. No posterior oropharyngeal erythema.  Eyes:     Extraocular Movements: Extraocular movements intact.     Conjunctiva/sclera: Conjunctivae normal.     Pupils: Pupils are equal, round, and reactive to light.  Musculoskeletal:        General: Tenderness present.     Right lower leg: No edema.     Left lower leg: No edema.     Comments:  Diminished pedal pulses bilaterally Tender to palpation along lower legs as well as along varicose vein inner R leg  No pain midline spine No paraspinous mm tenderness Neg SLR bilaterally. No pain with int/ext rotation at hip. Neg FABER. No pain at SIJ, GTB bilaterally. No pain along ITB on left. Pain at L sciatic notch  Skin:    General: Skin is warm and dry.     Findings: No rash.  Neurological:     Mental Status: She is alert.     Cranial Nerves: Cranial nerve deficit (hearing) present.     Sensory: Sensation is intact.     Motor: Motor function is intact.     Coordination: Coordination is intact. Romberg sign negative. Coordination normal. Finger-Nose-Finger Test normal.     Gait: Gait abnormal.     Comments:  CN 2-12 intact except decreased hearing noted FTN intact EOMI No pronator drift 5/5 strength BLE       Results for orders placed or performed in visit on 04/11/19  HM MAMMOGRAPHY  Result Value Ref Range   HM Mammogram 0-4 Bi-Rad 0-4 Bi-Rad, Self Reported Normal   Lab Results  Component Value Date   CREATININE 1.25 (H) 04/01/2019   BUN 28 (H) 04/01/2019   NA 140 04/01/2019   K 5.1 04/01/2019   CL 106 04/01/2019   CO2 27 04/01/2019    Lab Results  Component Value Date   WBC 5.6 04/01/2019   HGB  11.0 (L) 04/01/2019   HCT 35.1 (L) 04/01/2019   MCV 103.2 (H) 04/01/2019   PLT 200 04/01/2019    Lab Results  Component Value Date   TSH 2.36 10/26/2018    Lab Results  Component Value Date   VITAMINB12 >1525 (H) 10/26/2018    Assessment &  Plan:   Problem List Items Addressed This Visit    Left leg pain - Primary    Anticipate multifactorial L>R leg pain. Pain description suspicious for L sided sciatica from piriformis syndrome as well as degree of knee osteoarthritis, and clinically has component of chronic venous insufficiency. However she did have diminished pedal pulses bilaterally - will check ABIs prior to recommending thigh high compression stockings (Rx provided today). Update if not improving with treatment. Pt agrees with plan.       Relevant Orders   Ambulatory referral to Physical Therapy   VAS [place] LE ART SEG MULTI (Segm&LE Reynauds)   Imbalance    Overall non focal neurological exam, negative orthostatics today. She is regular with her vit b12. Denies urinary incontinence. No noted cognitive changes. rec continue cane use. Will refer to outpatient PT for balance training/fall prevention program. If no improvement with this, consider further evaluation of imbalance.       Relevant Orders   Ambulatory referral to Physical Therapy   CKD (chronic kidney disease), stage III   Chronic venous insufficiency of lower extremity    Other Visit Diagnoses    Diminished pulses in lower extremity       Relevant Orders   VAS [place] LE ART SEG MULTI (Segm&LE Reynauds)       No orders of the defined types were placed in this encounter.  Orders Placed This Encounter  Procedures  . Ambulatory referral to Physical Therapy    Referral Priority:   Routine    Referral Type:   Physical Medicine    Referral Reason:   Specialty Services Required    Requested Specialty:   Physical Therapy    Number of Visits Requested:   1    Patient Instructions  I think you may have left sided  sciatica along with chronic venous insufficiency.  Look at exercises provided today.  I do want to check arterial circulation evaluation to ensure pain not caused by artery blockage in the legs. We will also check this prior to starting thigh high compression stockings.  For imbalance, continue cane use and we will refer you to outpatient physical therapy for fall prevention and balance training program.  Let us know if not improving with above.   Follow up plan: Return if symptoms worsen or fail to improve.  Ria Bush, MD

## 2019-08-02 NOTE — Assessment & Plan Note (Signed)
Overall non focal neurological exam, negative orthostatics today. She is regular with her vit b12. Denies urinary incontinence. No noted cognitive changes. rec continue cane use. Will refer to outpatient PT for balance training/fall prevention program. If no improvement with this, consider further evaluation of imbalance.

## 2019-08-02 NOTE — Assessment & Plan Note (Signed)
Anticipate multifactorial L>R leg pain. Pain description suspicious for L sided sciatica from piriformis syndrome as well as degree of knee osteoarthritis, and clinically has component of chronic venous insufficiency. However she did have diminished pedal pulses bilaterally - will check ABIs prior to recommending thigh high compression stockings (Rx provided today). Update if not improving with treatment. Pt agrees with plan.

## 2019-08-05 ENCOUNTER — Ambulatory Visit (HOSPITAL_COMMUNITY)
Admission: RE | Admit: 2019-08-05 | Discharge: 2019-08-05 | Disposition: A | Discharge status: Home / Self Care | Payer: Medicare Other | Source: Ambulatory Visit | Attending: Cardiovascular Disease | Admitting: Cardiovascular Disease

## 2019-08-05 ENCOUNTER — Other Ambulatory Visit: Payer: Self-pay

## 2019-08-05 DIAGNOSIS — R0989 Other specified symptoms and signs involving the circulatory and respiratory systems: Secondary | ICD-10-CM | POA: Insufficient documentation

## 2019-08-05 DIAGNOSIS — M79605 Pain in left leg: Secondary | ICD-10-CM

## 2019-08-12 ENCOUNTER — Telehealth: Payer: Self-pay | Admitting: Family Medicine

## 2019-08-12 NOTE — Telephone Encounter (Signed)
Patient called and said she couldn't pull up a my chart message. Dr.G ordered a Vascular Ultrasound.  Patient thought it might be the results of the Vascular Ultrasound done on 08/05/19.  Patient wanted to know if she could get the results.

## 2019-08-12 NOTE — Telephone Encounter (Signed)
Released via mychart. plz notify overall ok arterial circulation evaluation. I think reasonable to try compression stockings Rx written last visit. If no improvement, rec f/u with Debbie for further evaluation of leg pains.

## 2019-08-15 NOTE — Telephone Encounter (Signed)
Left message on vm per dpr relaying Dr. G's message.  

## 2019-08-22 ENCOUNTER — Other Ambulatory Visit: Payer: Self-pay | Admitting: Family Medicine

## 2019-08-22 DIAGNOSIS — N183 Chronic kidney disease, stage 3 unspecified: Secondary | ICD-10-CM

## 2019-08-22 DIAGNOSIS — E785 Hyperlipidemia, unspecified: Secondary | ICD-10-CM

## 2019-08-22 DIAGNOSIS — I10 Essential (primary) hypertension: Secondary | ICD-10-CM

## 2019-09-21 ENCOUNTER — Other Ambulatory Visit: Payer: Self-pay | Admitting: Family Medicine

## 2019-10-18 ENCOUNTER — Other Ambulatory Visit: Payer: Self-pay | Admitting: Family Medicine

## 2019-10-18 DIAGNOSIS — G4700 Insomnia, unspecified: Secondary | ICD-10-CM

## 2019-10-18 DIAGNOSIS — I10 Essential (primary) hypertension: Secondary | ICD-10-CM

## 2019-10-18 DIAGNOSIS — N183 Chronic kidney disease, stage 3 unspecified: Secondary | ICD-10-CM

## 2019-10-18 DIAGNOSIS — E785 Hyperlipidemia, unspecified: Secondary | ICD-10-CM

## 2019-10-19 ENCOUNTER — Encounter: Payer: Self-pay | Admitting: Family Medicine

## 2019-10-19 ENCOUNTER — Ambulatory Visit (INDEPENDENT_AMBULATORY_CARE_PROVIDER_SITE_OTHER): Payer: Medicare Other | Admitting: Family Medicine

## 2019-10-19 ENCOUNTER — Other Ambulatory Visit: Payer: Self-pay

## 2019-10-19 VITALS — BP 128/70 | HR 79 | Temp 97.6°F | Ht 63.0 in | Wt 162.8 lb

## 2019-10-19 DIAGNOSIS — N1831 Chronic kidney disease, stage 3a: Secondary | ICD-10-CM

## 2019-10-19 DIAGNOSIS — M25512 Pain in left shoulder: Secondary | ICD-10-CM | POA: Diagnosis not present

## 2019-10-19 DIAGNOSIS — G4700 Insomnia, unspecified: Secondary | ICD-10-CM

## 2019-10-19 DIAGNOSIS — D631 Anemia in chronic kidney disease: Secondary | ICD-10-CM

## 2019-10-19 DIAGNOSIS — I10 Essential (primary) hypertension: Secondary | ICD-10-CM

## 2019-10-19 DIAGNOSIS — G8929 Other chronic pain: Secondary | ICD-10-CM

## 2019-10-19 DIAGNOSIS — E039 Hypothyroidism, unspecified: Secondary | ICD-10-CM | POA: Diagnosis not present

## 2019-10-19 LAB — CBC WITH DIFFERENTIAL/PLATELET
Basophils Absolute: 0.1 10*3/uL (ref 0.0–0.1)
Basophils Relative: 0.9 % (ref 0.0–3.0)
Eosinophils Absolute: 0.2 10*3/uL (ref 0.0–0.7)
Eosinophils Relative: 3 % (ref 0.0–5.0)
HCT: 31.4 % — ABNORMAL LOW (ref 36.0–46.0)
Hemoglobin: 10.4 g/dL — ABNORMAL LOW (ref 12.0–15.0)
Lymphocytes Relative: 21.7 % (ref 12.0–46.0)
Lymphs Abs: 1.5 10*3/uL (ref 0.7–4.0)
MCHC: 33 g/dL (ref 30.0–36.0)
MCV: 100.4 fl — ABNORMAL HIGH (ref 78.0–100.0)
Monocytes Absolute: 0.8 10*3/uL (ref 0.1–1.0)
Monocytes Relative: 11.1 % (ref 3.0–12.0)
Neutro Abs: 4.4 10*3/uL (ref 1.4–7.7)
Neutrophils Relative %: 63.3 % (ref 43.0–77.0)
Platelets: 212 10*3/uL (ref 150.0–400.0)
RBC: 3.13 Mil/uL — ABNORMAL LOW (ref 3.87–5.11)
RDW: 13.4 % (ref 11.5–15.5)
WBC: 6.9 10*3/uL (ref 4.0–10.5)

## 2019-10-19 LAB — COMPREHENSIVE METABOLIC PANEL
ALT: 19 U/L (ref 0–35)
AST: 20 U/L (ref 0–37)
Albumin: 4.2 g/dL (ref 3.5–5.2)
Alkaline Phosphatase: 45 U/L (ref 39–117)
BUN: 39 mg/dL — ABNORMAL HIGH (ref 6–23)
CO2: 25 mEq/L (ref 19–32)
Calcium: 9.6 mg/dL (ref 8.4–10.5)
Chloride: 107 mEq/L (ref 96–112)
Creatinine, Ser: 1.59 mg/dL — ABNORMAL HIGH (ref 0.40–1.20)
GFR: 31.43 mL/min — ABNORMAL LOW (ref >60.00)
Glucose, Bld: 97 mg/dL (ref 70–99)
Potassium: 4.7 mEq/L (ref 3.5–5.1)
Sodium: 139 mEq/L (ref 135–145)
Total Bilirubin: 0.6 mg/dL (ref 0.2–1.2)
Total Protein: 6.7 g/dL (ref 6.0–8.3)

## 2019-10-19 LAB — TSH: TSH: 1.74 u[IU]/mL (ref 0.35–4.50)

## 2019-10-19 MED ORDER — ALPRAZOLAM 0.25 MG PO TABS: 0.2500 mg | ORAL_TABLET | Freq: Every day | ORAL | 1 refills | Status: DC

## 2019-10-19 MED ORDER — VALACYCLOVIR HCL 1 G PO TABS: 1000.0000 mg | ORAL_TABLET | Freq: Two times a day (BID) | ORAL | 1 refills | Status: DC

## 2019-10-19 NOTE — Progress Notes (Signed)
Subjective:    Patient ID: Carla Lyons, female    DOB: 02/09/1941, 79 y.o.   MRN: [id number]  HPI Chief Complaint  Patient presents with  . Shoulder Pain    X 6 months in left shoulder. At night pain seems to radiate up into neck. Pt feels that its nerve related at times - "crawling sensation from back of head to face".    Pt has tried Tylenol for pain, heat, hot showers and OTC topicals.    This is a 79 yo female who presents today for above cc. Patient has had left shoulder pain x approximately 6 months, started after doing some yard work where she was clearing some debris. She has had pain and swelling in left shoulder, difficulty raising her arm. Little improvement with acetaminophen, no improvement with topical treatments.   Has otherwise been doing well through pandemic.   Past Medical History:  Diagnosis Date  . Allergy   . Anemia   . 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 Colon polyps 06-15-07  . Diverticulosis    hx of  . Hyperlipidemia   . Hypertension   . Osteoporosis   . Personal history of colonic adenomas 07/19/2007  . Renal insufficiency   . Thyroid disease    Past Surgical History:  Procedure Laterality Date  . APPENDECTOMY    . CATARACT EXTRACTION W/ INTRAOCULAR LENS IMPLANT Right 01/08/2018  . CATARACT EXTRACTION W/ INTRAOCULAR LENS IMPLANT Left 02/05/2018  . CESAREAN SECTION     x 2  . PARTIAL HYSTERECTOMY  age 51  . VAGINAL HYSTERECTOMY  age 32   partial   Family History  Problem Relation Age of Onset  . Coronary artery disease Mother   . Other Mother        bypass surgery  . Dementia Mother   . Osteoarthritis Father   . Heart disease Father   . Heart attack Maternal Grandfather   . Lung disease Brother   . Hypertension Brother   . Lung disease Sister   . Lung disease Sister   . Colon cancer Neg Hx   . Esophageal cancer Neg Hx   . Rectal cancer Neg Hx   . Stomach cancer Neg Hx    Social History   Tobacco Use  . Smoking  status: Former Smoker    Quit date: 10/07/2001    Years since quitting: 18.0  . Smokeless tobacco: Never Used  Substance Use Topics  . Alcohol use: Yes    Alcohol/week: 2.0 - 3.0 standard drinks    Types: 2 - 3 Glasses of wine per week    Comment: rare  . Drug use: No      Review of Systems Per HPI    Objective:   Physical Exam Vitals reviewed.  Constitutional:      General: She is not in acute distress.    Appearance: Normal appearance. She is normal weight. She is not ill-appearing, toxic-appearing or diaphoretic.  HENT:     Head: Normocephalic and atraumatic.  Eyes:     Conjunctiva/sclera: Conjunctivae normal.  Cardiovascular:     Rate and Rhythm: Normal rate and regular rhythm.  Pulmonary:     Effort: Pulmonary effort is normal.     Breath sounds: Normal breath sounds.  Musculoskeletal:     Left shoulder: Tenderness present. Decreased range of motion.     Right lower leg: No edema.     Left lower leg: No edema.     Comments: Left shoulder  with decreased abduction, painful to palpation.   Skin:    General: Skin is warm and dry.  Neurological:     Mental Status: She is alert and oriented to person, place, and time.  Psychiatric:        Mood and Affect: Mood normal.        Behavior: Behavior normal.        Thought Content: Thought content normal.        Judgment: Judgment normal.      BP 128/70 (BP Location: Right Arm, Patient Position: Sitting, Cuff Size: Normal)   Pulse 79   Temp 97.6 F (36.4 C) (Temporal)   Ht 5\' 3"  (1.6 m)   Wt 162 lb 12.8 oz (73.8 kg)   SpO2 95%   BMI 28.84 kg/m  Wt Readings from Last 3 Encounters:  10/19/19 162 lb 12.8 oz (73.8 kg)  08/02/19 158 lb 2 oz (71.7 kg)  04/11/19 162 lb (73.5 kg)      Assessment & Plan:  1. Chronic left shoulder pain - suspect bursitis, have suggested she follow up with Dr. Lorelei Pont, sports medicine, which she is agreeable.   2. Insomnia, unspecified type - patient requests refill of prn  alprazolam - ALPRAZolam (XANAX) 0.25 MG tablet; Take 1 tablet (0.25 mg total) by mouth at bedtime.  Dispense: 30 tablet; Refill: 1  3. Acquired hypothyroidism - she is due labs in next couple of weeks prior to routine follow up, will go ahead and obtain labs today  - TSH  4. Essential hypertension - well controlled on current meds - Comprehensive metabolic panel  5. Stage 3a chronic kidney disease - she has upcoming appointment with nephrologist, will provide her with copy of labs to take to her visit - Comprehensive metabolic panel  6. Anemia in stage 3a chronic kidney disease - CBC with Differential  - follow up on file  This visit occurred during the SARS-CoV-2 public health emergency.  Safety protocols were in place, including screening questions prior to the visit, additional usage of staff PPE, and extensive cleaning of exam room while observing appropriate contact time as indicated for disinfecting solutions.    Clarene Reamer, FNP-BC  [facility] at [facility], [facility]  10/19/2019 8:46 PM

## 2019-10-19 NOTE — Patient Instructions (Signed)
Good to see you today  Please schedule an appointment with Dr. Lorelei Pont

## 2019-10-21 NOTE — Telephone Encounter (Signed)
Last refill: 1.13.21 #30, 0 Last OV: 1.13.21 dx. Shoulder pain

## 2019-10-24 ENCOUNTER — Other Ambulatory Visit: Payer: Self-pay

## 2019-10-24 ENCOUNTER — Encounter: Payer: Self-pay | Admitting: Family Medicine

## 2019-10-24 ENCOUNTER — Ambulatory Visit (INDEPENDENT_AMBULATORY_CARE_PROVIDER_SITE_OTHER): Payer: Medicare Other | Admitting: Family Medicine

## 2019-10-24 VITALS — BP 160/70 | HR 58 | Temp 97.9°F | Ht 63.0 in | Wt 163.0 lb

## 2019-10-24 DIAGNOSIS — M25512 Pain in left shoulder: Secondary | ICD-10-CM

## 2019-10-24 DIAGNOSIS — M7502 Adhesive capsulitis of left shoulder: Secondary | ICD-10-CM

## 2019-10-24 DIAGNOSIS — G8929 Other chronic pain: Secondary | ICD-10-CM

## 2019-10-24 MED ORDER — METHYLPREDNISOLONE ACETATE 40 MG/ML IJ SUSP
80.0000 mg | Freq: Once | INTRAMUSCULAR | Status: AC
  Administered 2019-10-24: 13:00:00 80 mg via INTRA_ARTICULAR

## 2019-10-24 NOTE — Progress Notes (Signed)
Carla Hickling T. Naszir Cott, MD Primary Care and Sports Medicine [facility] at [facility] [place], [address] Phone: [phone]  FAX: [phone]  Ziare Rozell - 79 y.o. female  MRN [id number]  Date of Birth: [birth year]  Visit Date: 10/24/2019  PCP: Elby Beck, FNP  Referred by: Elby Beck, FNP  Chief Complaint  Patient presents with  . Shoulder Pain    Left    This visit occurred during the SARS-CoV-2 public health emergency.  Safety protocols were in place, including screening questions prior to the visit, additional usage of staff PPE, and extensive cleaning of exam room while observing appropriate contact time as indicated for disinfecting solutions.   Subjective:   Carla Lyons is a 79 y.o. very pleasant female patient who presents with the following: shoulder pain  The patient noted above presents with shoulder pain that has been ongoing for 3-4 month, but this is been increasing and worsening. there is no history of trauma or accident, but she did have some minor pain in the fall.  After this though she was still able to have full range of motion with some modest pain. The patient denies neck pain or radicular symptoms. No shoulder blade pain Denies dislocation, subluxation, separation of the shoulder. The patient does complain of pain with flexion, abduction, and terminal motion.  Significant restriction of motion. she describes a deep ache around the shoulder, and sometimes it will wake the patient up at night.  Last fall doing a summer clean up. Wanted to stay at home. 3-4 months. At a specific time. Motion was ok in the fall. Now worst over the last few weeks or so.   USed heat and ben-gay.  Limnaments.  Hurts. Has a limitation in motion.   Aches at night aroun the shoulder.   L shoulder inj    Medications Tried: Over-the-counter Tylenol and NSAIDs. Ice or Heat: minimal help Tried PT: No  Prior shoulder  Injury: No Prior surgery: No Prior fracture: No  Past Medical History, Surgical History, Social History, Family History, Medications, and allergies reviewed and updated if relevant.   GEN: No fevers, chills. Nontoxic. Primarily MSK c/o today. MSK: Detailed in the HPI GI: tolerating PO intake without difficulty Neuro: No numbness, parasthesias, or tingling associated. Otherwise the pertinent positives of the ROS are noted above.    Objective:   Blood pressure (!) 160/70, pulse (!) 58, temperature 97.9 F (36.6 C), temperature source Temporal, height 5\' 3"  (1.6 m), weight 163 lb (73.9 kg), SpO2 95 %.   GEN: WDWN, NAD, Non-toxic, Alert & Oriented x 3 HEENT: Atraumatic, Normocephalic.  Ears and Nose: No external deformity. EXTR: No clubbing/cyanosis/edema NEURO: Normal gait.  PSYCH: Normally interactive. Conversant. Not depressed or anxious appearing.  Calm demeanor.   Shoulder: R and L Inspection: No muscle wasting or winging Ecchymosis/edema: neg  AC joint, scapula, clavicle: NT Cervical spine: NT, full ROM Spurling's: neg ABNORMAL SIDE TESTED: Left UNLESS OTHERWISE NOTED, THE CONTRALATERAL SIDE HAS FULL RANGE OF MOTION. Abduction: 5/5, LIMITED TO 130 DEGREES Flexion: 5/5, LIMITED TO 135 DEGNO ROM  IR, lift-off: 5/5. TESTED AT 90 DEGREES OF ABDUCTION, LIMITED TO 0 DEGREES ER at neutral:  5/5, TESTED AT 90 DEGREES OF ABDUCTION, LIMITED TO 20 DEGREES AC crossover and compression: PAIN Drop Test: neg Empty Can: neg Supraspinatus insertion: NT Bicipital groove: NT ALL OTHER SPECIAL TESTING EQUIVOCAL GIVEN LOSS OF MOTION C5-T1 intact Sensation intact Grip 5/5  Assessment and Plan:     ICD-10-CM   1. Adhesive capsulitis of left shoulder  M75.02   2. Chronic left shoulder pain  M25.512 methylPREDNISolone acetate (DEPO-MEDROL) injection 80 mg   G89.29    Classic frozen shoulder.  >25 minutes spent in face to face time with patient, >50% spent in counselling or  coordination of care  Patient was given a systematic ROM protocol from [employer] to be done daily. Emphasized importance of adherence, help of PT, daily HEP.  The average length of total symptoms is 12-18 months going through 3 different phases in the freezing and thawing process. Reviewed all with patient.  Secondary, and I think that this will improve faster compared to idiopathic.  Tylenol or NSAID of choice prn for pain relief Intraarticular shoulder injections discussed with patient, which have good evidence for accelerating the thawing phase.  Patient will be sent for formal PT for aggressive frozen shoulder ROM after her home rehab program. Will need RTC str and scapular stabilization to fix underlying mechanics.  Intraarticular Shoulder Aspiration/Injection Procedure Note Azha Schermerhorn 1940/10/07 Date of procedure: 10/24/2019  Procedure: Large Joint Aspiration / Injection of Shoulder, Intraarticular, L Indications: Pain  Procedure Details Verbal consent was obtained from the patient. Risks including infection explained and contrasted with benefits and alternatives. Patient prepped with Chloraprep and Ethyl Chloride used for anesthesia. An intraarticular shoulder injection was performed using the posterior approach; needle placed into joint capsule without difficulty. The patient tolerated the procedure well and had decreased pain post injection. No complications. Injection: 8 cc of Lidocaine 1% and 2 mL Depo-Medrol 40 mg. Needle: 21 gauge, 2 inch   I appreciate the opportunity to evaluate this very friendly patient. If you have any question regarding her care or prognosis, do not hesitate to ask.  Follow-up: Return in about 2 months (around 12/22/2019) for f/u frozen shoulder.  Meds ordered this encounter  Medications  . methylPREDNISolone acetate (DEPO-MEDROL) injection 80 mg   No orders of the defined types were placed in this encounter.   Signed,  Maud Deed. Britnay Magnussen,  MD   Patient's Medications  New Prescriptions   No medications on file  Previous Medications   ALPRAZOLAM (XANAX) 0.25 MG TABLET    Take 1 tablet (0.25 mg total) by mouth at bedtime.   ASPIRIN (ADULT ASPIRIN EC LOW STRENGTH) 81 MG EC TABLET    Take 81 mg by mouth daily.    ATORVASTATIN (LIPITOR) 20 MG TABLET    TAKE 1 TABLET BY MOUTH EVERY DAY   B COMPLEX VITAMINS TABLET    Take 1 tablet by mouth daily.   BENAZEPRIL (LOTENSIN) 10 MG TABLET    TAKE 1 TABLET BY MOUTH EVERY DAY   BIOTIN 5000 PO    Take 10,000 mcg by mouth daily.   CHOLECALCIFEROL (VITAMIN D) 1000 UNITS TABLET    Take 1,000 Units by mouth 2 (two) times daily.    FERROUS SULFATE PO    Take 1 tablet by mouth daily. 5 grams.  Take one tablet at bedtime with orange juice   FUROSEMIDE (LASIX) 20 MG TABLET    Take 20 mg by mouth as needed for edema.   HYDROCHLOROTHIAZIDE (HYDRODIURIL) 12.5 MG TABLET    as needed.    LEVOTHYROXINE (SYNTHROID) 88 MCG TABLET    TAKE 1 TABLET BY MOUTH EVERY DAY   OMEGA-3 FATTY ACIDS (FISH OIL) 1000 MG CAPS    Take by mouth. Take two daily    VALACYCLOVIR (VALTREX) 1000 MG  TABLET    Take 1 tablet (1,000 mg total) by mouth 2 (two) times daily. For two doses as needed for cold sores.   VITAMIN B-12 (CYANOCOBALAMIN) 1000 MCG TABLET    Take 1,000 mcg by mouth daily. Take one daily SL.  Modified Medications   No medications on file  Discontinued Medications   No medications on file

## 2019-10-26 ENCOUNTER — Ambulatory Visit: Payer: Medicare Other | Attending: Internal Medicine

## 2019-10-26 DIAGNOSIS — Z23 Encounter for immunization: Secondary | ICD-10-CM | POA: Insufficient documentation

## 2019-10-26 NOTE — Progress Notes (Signed)
   Covid-19 Vaccination Clinic  Name:  Carla Lyons    MRN: [id number] DOB: 1941/09/27  10/26/2019  Carla Lyons was observed post Covid-19 immunization for 15 minutes without incidence. She was provided with Vaccine Information Sheet and instruction to access the V-Safe system.   Carla Lyons was instructed to call 911 with any severe reactions post vaccine: Marland Kitchen Difficulty breathing  . Swelling of your face and throat  . A fast heartbeat  . A bad rash all over your body  . Dizziness and weakness    Immunizations Administered    Name Date Dose VIS Date Route   Pfizer COVID-19 Vaccine 10/26/2019  8:46 AM 0.3 mL 09/16/2019 Intramuscular   Manufacturer: [place]   Lot: [id number]   [place]: [id number]

## 2019-11-02 ENCOUNTER — Other Ambulatory Visit: Payer: Self-pay | Admitting: Family Medicine

## 2019-11-02 DIAGNOSIS — D539 Nutritional anemia, unspecified: Secondary | ICD-10-CM

## 2019-11-03 ENCOUNTER — Encounter: Payer: Self-pay | Admitting: Family Medicine

## 2019-11-03 NOTE — Progress Notes (Signed)
Lavonia Dana, MD/thx dmf

## 2019-11-04 ENCOUNTER — Other Ambulatory Visit (INDEPENDENT_AMBULATORY_CARE_PROVIDER_SITE_OTHER): Payer: Medicare Other

## 2019-11-04 ENCOUNTER — Other Ambulatory Visit: Payer: Self-pay

## 2019-11-04 DIAGNOSIS — D539 Nutritional anemia, unspecified: Secondary | ICD-10-CM | POA: Diagnosis not present

## 2019-11-04 LAB — VITAMIN B12: Vitamin B-12: 665 pg/mL (ref 211–911)

## 2019-11-04 LAB — FERRITIN: Ferritin: 21 ng/mL (ref 10.0–291.0)

## 2019-11-14 ENCOUNTER — Encounter: Payer: Self-pay | Admitting: Family Medicine

## 2019-11-14 ENCOUNTER — Ambulatory Visit (INDEPENDENT_AMBULATORY_CARE_PROVIDER_SITE_OTHER): Payer: Medicare Other | Admitting: Family Medicine

## 2019-11-14 ENCOUNTER — Other Ambulatory Visit: Payer: Self-pay

## 2019-11-14 VITALS — BP 124/76 | HR 72 | Temp 98.0°F | Ht 63.0 in | Wt 159.1 lb

## 2019-11-14 DIAGNOSIS — N1831 Chronic kidney disease, stage 3a: Secondary | ICD-10-CM

## 2019-11-14 DIAGNOSIS — I7143 Infrarenal abdominal aortic aneurysm, without rupture: Secondary | ICD-10-CM

## 2019-11-14 DIAGNOSIS — Z9189 Other specified personal risk factors, not elsewhere classified: Secondary | ICD-10-CM

## 2019-11-14 DIAGNOSIS — K5792 Diverticulitis of intestine, part unspecified, without perforation or abscess without bleeding: Secondary | ICD-10-CM | POA: Diagnosis not present

## 2019-11-14 DIAGNOSIS — N1832 Chronic kidney disease, stage 3b: Secondary | ICD-10-CM

## 2019-11-14 DIAGNOSIS — D631 Anemia in chronic kidney disease: Secondary | ICD-10-CM

## 2019-11-14 DIAGNOSIS — I714 Abdominal aortic aneurysm, without rupture: Secondary | ICD-10-CM

## 2019-11-14 NOTE — Patient Instructions (Signed)
Good to see you today  I have put in a referral for you to see a dietician  I will notify Glendell Docker that you are interested in iron infusion

## 2019-11-14 NOTE — Progress Notes (Signed)
   Subjective:    Patient ID: Carla Lyons, female    DOB: [birth year], 79 y.o.   MRN: [id number]  HPI Chief Complaint  Patient presents with  . Follow-up    Review labs - discuss medication, diet and a referral to Dietician -- pt has several concerns regarding her medications and diet. (see lab results)   This is a 79 yo female who presents today with above concerns.   Anemia- slightly decreased hgb and little improvement in ferritin on oral iron supplement. Taking daily. Decreased energy.   CRI- Stage 3, recent follow up with nephrologist. Stable labs. Takes several supplements. OK with CRI?   Diet- patient unsure what to eat. Has had issues with hyperkalemia in the past and is trying to follow a low potassium diet. History of diverticulitis and avoids many foods that she is concerned will trigger.   Left shoulder pain- improved with exercises  Balance- improved with exercises from PT.   Anxiety- takes alprazolam 0.25, 1/2 tab 3-4 times a week  Review of Systems Per HPI    Objective:   Physical Exam Constitutional:      General: She is not in acute distress.    Appearance: Normal appearance. She is normal weight. She is not ill-appearing, toxic-appearing or diaphoretic.  HENT:     Head: Normocephalic and atraumatic.  Eyes:     Conjunctiva/sclera: Conjunctivae normal.  Cardiovascular:     Rate and Rhythm: Normal rate.  Pulmonary:     Effort: Pulmonary effort is normal.  Neurological:     Mental Status: She is alert and oriented to person, place, and time.  Psychiatric:        Mood and Affect: Mood normal.        Behavior: Behavior normal.        Thought Content: Thought content normal.        Judgment: Judgment normal.       BP 122/60 (BP Location: Left Arm, Patient Position: Sitting, Cuff Size: Normal)   Pulse 66   Temp 98 F (36.7 C) (Temporal)   Ht 5\' 3"  (1.6 m)   Wt 159 lb 1.9 oz (72.2 kg)   SpO2 96%   BMI 28.19 kg/m  Wt Readings from Last 3 Encounters:    11/14/19 159 lb 1.9 oz (72.2 kg)  10/24/19 163 lb (73.9 kg)  10/19/19 162 lb 12.8 oz (73.8 kg)       Assessment & Plan:  1. Stage 3b chronic kidney disease - stable, continued follow up with nephrologr - Amb ref to Medical Nutrition Therapy-MNT  2. Anemia in stage 3a chronic kidney disease - Amb ref to Medical Nutrition Therapy-MNT  3. Diverticulitis - Amb ref to Medical Nutrition Therapy-MNT  4. At high risk for hyperkalemia - Amb ref to Medical Nutrition Therapy-MNT  5. Aneurysm of infrarenal abdominal aorta (HCC) - seen on renal stone CT from 08/17/2016 - VAS US AORTA/IVC/ILIACS; Future   Clarene Reamer, FNP-BC   Primary Care at [facility]  11/18/2019 5:34 PM

## 2019-11-16 ENCOUNTER — Ambulatory Visit: Payer: Medicare Other | Attending: Internal Medicine

## 2019-11-16 DIAGNOSIS — Z23 Encounter for immunization: Secondary | ICD-10-CM | POA: Insufficient documentation

## 2019-11-16 NOTE — Progress Notes (Signed)
   Covid-19 Vaccination Clinic  Name:  Carla Lyons    MRN: [id number] DOB: [birth year]  11/16/2019  Carla Lyons was observed post Covid-19 immunization for 15 minutes without incidence. She was provided with Vaccine Information Sheet and instruction to access the V-Safe system.   Carla Lyons was instructed to call 911 with any severe reactions post vaccine: Marland Kitchen Difficulty breathing  . Swelling of your face and throat  . A fast heartbeat  . A bad rash all over your body  . Dizziness and weakness    Immunizations Administered    Name Date Dose VIS Date Route   Pfizer COVID-19 Vaccine 11/16/2019  8:18 AM 0.3 mL 09/16/2019 Intramuscular   Manufacturer: [place]   Lot: [id number]   [place]: [id number]

## 2019-11-18 ENCOUNTER — Telehealth: Payer: Self-pay | Admitting: Family Medicine

## 2019-11-18 ENCOUNTER — Encounter: Payer: Self-pay | Admitting: Family Medicine

## 2019-11-18 NOTE — Telephone Encounter (Signed)
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 I have ordered an ultrasound to evaluate her aorta.

## 2019-11-19 ENCOUNTER — Other Ambulatory Visit: Payer: Self-pay | Admitting: Family Medicine

## 2019-11-19 DIAGNOSIS — E785 Hyperlipidemia, unspecified: Secondary | ICD-10-CM

## 2019-11-20 ENCOUNTER — Other Ambulatory Visit: Payer: Self-pay | Admitting: Family Medicine

## 2019-11-20 DIAGNOSIS — N183 Chronic kidney disease, stage 3 unspecified: Secondary | ICD-10-CM

## 2019-11-20 DIAGNOSIS — I10 Essential (primary) hypertension: Secondary | ICD-10-CM

## 2019-11-21 NOTE — Telephone Encounter (Signed)
Called patient and spoke with her regarding ultrasound.

## 2019-11-21 NOTE — Telephone Encounter (Signed)
Patient is requesting a call back in regards to the Ultrasound She stated she is very confused on the reason for having this done and does not remember talking about this in her appt.

## 2019-11-22 ENCOUNTER — Other Ambulatory Visit: Payer: Self-pay | Admitting: *Deleted

## 2019-11-22 DIAGNOSIS — D509 Iron deficiency anemia, unspecified: Secondary | ICD-10-CM

## 2019-11-30 ENCOUNTER — Ambulatory Visit (AMBULATORY_SURGERY_CENTER): Payer: Self-pay

## 2019-11-30 ENCOUNTER — Other Ambulatory Visit: Payer: Self-pay

## 2019-11-30 VITALS — Temp 97.8°F | Ht 63.0 in | Wt 165.0 lb

## 2019-11-30 DIAGNOSIS — D509 Iron deficiency anemia, unspecified: Secondary | ICD-10-CM

## 2019-11-30 NOTE — Progress Notes (Signed)
No egg or soy allergy known to patient  No issues with past sedation with any surgeries  or procedures, no intubation problems  No diet pills per patient No home 02 use per patient  No blood thinners per patient  Pt denies issues with constipation  No A fib or A flutter  EMMI video sent to pt's e mail   STOP PO IRON ON 12/02/19, PT HAVING IRON INFUSION ON 12/06/19. 2ND COVID VACCINE 11/16/19  Due to the COVID-19 pandemic we are asking patients to follow these guidelines. Please only bring one care partner. Please be aware that your care partner may wait in the car in the parking lot or if they feel like they will be too hot to wait in the car, they may wait in the lobby on the 4th floor. All care partners are required to wear a mask the entire time (we do not have any that we can provide them), they need to practice social distancing, and we will do a Covid check for all patient's and care partners when you arrive. Also we will check their temperature and your temperature. If the care partner waits in their car they need to stay in the parking lot the entire time and we will call them on their cell phone when the patient is ready for discharge so they can bring the car to the front of the building. Also all patient's will need to wear a mask into building.

## 2019-12-01 ENCOUNTER — Ambulatory Visit (HOSPITAL_COMMUNITY)
Admission: RE | Admit: 2019-12-01 | Discharge: 2019-12-01 | Disposition: A | Discharge status: Home / Self Care | Payer: Medicare Other | Source: Ambulatory Visit | Attending: Family Medicine | Admitting: Family Medicine

## 2019-12-01 ENCOUNTER — Telehealth (HOSPITAL_COMMUNITY): Payer: Self-pay | Admitting: General Practice

## 2019-12-01 ENCOUNTER — Encounter (HOSPITAL_COMMUNITY): Payer: Medicare Other

## 2019-12-01 DIAGNOSIS — I714 Abdominal aortic aneurysm, without rupture: Secondary | ICD-10-CM | POA: Insufficient documentation

## 2019-12-01 DIAGNOSIS — I7143 Infrarenal abdominal aortic aneurysm, without rupture: Secondary | ICD-10-CM

## 2019-12-01 NOTE — Telephone Encounter (Signed)
Patient called wanting to know the type of Iron infusion she will be receiving, how long it will take, how the procedure will affect her other scheduled endoscopy procedure for the following day, what to expect during the iron infusion and how to get to the [facility]. Patient was directed to contact her provider for the answers to some of the questions and RN answered the questions that bothered on location and length of infusion. Patient expressed satisfaction with the answers she got.

## 2019-12-02 ENCOUNTER — Telehealth: Payer: Self-pay | Admitting: Internal Medicine

## 2019-12-02 ENCOUNTER — Encounter: Payer: Self-pay | Admitting: Internal Medicine

## 2019-12-02 NOTE — Telephone Encounter (Signed)
Patient calling- She has endoscopy on Tuesday 3/2. On her instructions it states to quit taking iron supplements today. She is scheduled for an iron infusion on Monday. She is asking if this is okay and would like to speak to someone about it.

## 2019-12-02 NOTE — Telephone Encounter (Signed)
Pt instructed she does not have to stop IRON tabs for an EGD Monday 3-2-  Informed pt we do not stop IRON for EGD's= she was concerned because it was typed and high lighted on her EGD instructions stop Iron 5 days before- explained it must have been accidentally put in her instructions as we do stop  it for Colonoscopies but not egd's - also instructed IRON infusion Monday is ok with no issues with EGD Tuesday 3-2

## 2019-12-05 ENCOUNTER — Other Ambulatory Visit: Payer: Self-pay

## 2019-12-05 ENCOUNTER — Telehealth: Payer: Self-pay | Admitting: Internal Medicine

## 2019-12-05 ENCOUNTER — Ambulatory Visit (HOSPITAL_COMMUNITY)
Admission: RE | Admit: 2019-12-05 | Discharge: 2019-12-05 | Disposition: A | Discharge status: Home / Self Care | Payer: Medicare Other | Source: Ambulatory Visit | Attending: Internal Medicine | Admitting: Internal Medicine

## 2019-12-05 DIAGNOSIS — D509 Iron deficiency anemia, unspecified: Secondary | ICD-10-CM | POA: Diagnosis present

## 2019-12-05 MED ORDER — SODIUM CHLORIDE 0.9 % IV SOLN
INTRAVENOUS | Status: DC | PRN
  Administered 2019-12-05: 250 mL via INTRAVENOUS

## 2019-12-05 MED ORDER — SODIUM CHLORIDE 0.9 % IV SOLN
1000.0000 mg | Freq: Once | INTRAVENOUS | Status: AC
  Administered 2019-12-05: 1000 mg via INTRAVENOUS
  Filled 2019-12-05: qty 20

## 2019-12-05 MED ORDER — SODIUM CHLORIDE 0.9 % IV SOLN
25.0000 mg | Freq: Once | INTRAVENOUS | Status: AC
  Administered 2019-12-05: 25 mg via INTRAVENOUS
  Filled 2019-12-05: qty 0.5

## 2019-12-05 NOTE — Discharge Instructions (Signed)
Iron Dextran injection What is this medicine? IRON DEXTRAN (AHY ern DEX tran) is an iron complex. Iron is used to make healthy red blood cells, which carry oxygen and nutrients through the body. This medicine is used to treat people who cannot take iron by mouth and have low levels of iron in the blood. This medicine may be used for other purposes; ask your health care provider or pharmacist if you have questions. COMMON BRAND NAME(S): Dexferrum, INFeD What should I tell my health care provider before I take this medicine? They need to know if you have any of these conditions:  anemia not caused by low iron levels  heart disease  high levels of iron in the blood  kidney disease  liver disease  an unusual or allergic reaction to iron, other medicines, foods, dyes, or preservatives  pregnant or trying to get pregnant  breast-feeding How should I use this medicine? This medicine is for injection into a vein or a muscle. It is given by a health care professional in a hospital or clinic setting. Talk to your pediatrician regarding the use of this medicine in children. While this drug may be prescribed for children as young as 4 months old for selected conditions, precautions do apply. Overdosage: If you think you have taken too much of this medicine contact a poison control center or emergency room at once. NOTE: This medicine is only for you. Do not share this medicine with others. What if I miss a dose? It is important not to miss your dose. Call your doctor or health care professional if you are unable to keep an appointment. What may interact with this medicine? Do not take this medicine with any of the following medications:  deferoxamine  dimercaprol  other iron products This medicine may also interact with the following medications:  chloramphenicol  deferasirox This list may not describe all possible interactions. Give your health care provider a list of all the  medicines, herbs, non-prescription drugs, or dietary supplements you use. Also tell them if you smoke, drink alcohol, or use illegal drugs. Some items may interact with your medicine. What should I watch for while using this medicine? Visit your doctor or health care professional regularly. Tell your doctor if your symptoms do not start to get better or if they get worse. You may need blood work done while you are taking this medicine. You may need to follow a special diet. Talk to your doctor. Foods that contain iron include: whole grains/cereals, dried fruits, beans, or peas, leafy green vegetables, and organ meats (liver, kidney). Long-term use of this medicine may increase your risk of some cancers. Talk to your doctor about how to limit your risk. What side effects may I notice from receiving this medicine? Side effects that you should report to your doctor or health care professional as soon as possible:  allergic reactions like skin rash, itching or hives, swelling of the face, lips, or tongue  blue lips, nails, or skin  breathing problems  changes in blood pressure  chest pain  confusion  fast, irregular heartbeat  feeling faint or lightheaded, falls  fever or chills  flushing, sweating, or hot feelings  joint or muscle aches or pains  pain, tingling, numbness in the hands or feet  seizures  unusually weak or tired Side effects that usually do not require medical attention (report to your doctor or health care professional if they continue or are bothersome):  change in taste (metallic taste)    diarrhea  headache  irritation at site where injected  nausea, vomiting  stomach upset This list may not describe all possible side effects. Call your doctor for medical advice about side effects. You may report side effects to FDA at 1-800-FDA-1088. Where should I keep my medicine? This drug is given in a hospital or clinic and will not be stored at home. NOTE: This  sheet is a summary. It may not cover all possible information. If you have questions about this medicine, talk to your doctor, pharmacist, or health care provider.  2020 Elsevier/Gold Standard (2008-02-08 16:59:50)  

## 2019-12-05 NOTE — Progress Notes (Signed)
Patient received Infed via PIV. Test dose administered, patient waited for 1 hour, followed by 1000 mg of Infed. Pre-infusion BP was 133/59. Post infusion BP was 158/66. Contacted [facility] at [facility]. RN spoke with Kathlene November MD about the BP. Per MD, since patient has no symptoms, patient can be discharged. Patient stated she feels "very fine". Tolerated well,  discharge instructions given. Patient alert, oriented and ambulatory at the time of discharge. Patient told to go to the the ER if she develops chest pain, headache and other symptoms. Patient verbalized understanding.

## 2019-12-05 NOTE — Telephone Encounter (Signed)
Received a call from the nurse at the infusion center. Upon arrival BP was 133/59, now is 158/66. RN wonders if it is okay to send her home with a slightly elevated BP, she is asymptomatic. Okay to go home, watch for headache, chest pain, shortness of breath. Will notify PCP

## 2019-12-06 ENCOUNTER — Ambulatory Visit (AMBULATORY_SURGERY_CENTER): Payer: Medicare Other | Admitting: Internal Medicine

## 2019-12-06 ENCOUNTER — Encounter: Payer: Self-pay | Admitting: Internal Medicine

## 2019-12-06 VITALS — BP 150/65 | HR 60 | Temp 95.9°F | Resp 12 | Ht 63.0 in | Wt 165.0 lb

## 2019-12-06 DIAGNOSIS — K449 Diaphragmatic hernia without obstruction or gangrene: Secondary | ICD-10-CM

## 2019-12-06 DIAGNOSIS — D509 Iron deficiency anemia, unspecified: Secondary | ICD-10-CM

## 2019-12-06 DIAGNOSIS — D500 Iron deficiency anemia secondary to blood loss (chronic): Secondary | ICD-10-CM

## 2019-12-06 DIAGNOSIS — K2950 Unspecified chronic gastritis without bleeding: Secondary | ICD-10-CM | POA: Diagnosis not present

## 2019-12-06 DIAGNOSIS — K257 Chronic gastric ulcer without hemorrhage or perforation: Secondary | ICD-10-CM | POA: Diagnosis not present

## 2019-12-06 HISTORY — DX: Iron deficiency anemia secondary to blood loss (chronic): D50.0

## 2019-12-06 HISTORY — DX: Chronic gastric ulcer without hemorrhage or perforation: K25.7

## 2019-12-06 HISTORY — DX: Diaphragmatic hernia without obstruction or gangrene: K44.9

## 2019-12-06 MED ORDER — SODIUM CHLORIDE 0.9 % IV SOLN: 500.0000 mL | Freq: Once | INTRAVENOUS | Status: DC

## 2019-12-06 MED ORDER — FENTANYL CITRATE (PF) 100 MCG/2ML IJ SOLN
25.0000 ug | Freq: Once | INTRAMUSCULAR | Status: AC
  Administered 2019-12-06: 25 ug via INTRAVENOUS

## 2019-12-06 MED ORDER — OMEPRAZOLE 40 MG PO CPDR: 40.0000 mg | DELAYED_RELEASE_CAPSULE | Freq: Every day | ORAL | 3 refills | Status: DC

## 2019-12-06 NOTE — Telephone Encounter (Signed)
Please call patient and see how she is feeling after her infusion yesterday. Does she have any upcoming appointments where her blood pressure will be checked? If I remember correctly, her cuff was not matching up with office readings.

## 2019-12-06 NOTE — Progress Notes (Signed)
Called to room to assist during endoscopic procedure.  Patient ID and intended procedure confirmed with present staff. Received instructions for my participation in the procedure from the performing physician.  

## 2019-12-06 NOTE — Telephone Encounter (Signed)
[facility] Night - Client Nonclinical Telephone Record AccessNurse Client [facility] Night - Client Client Site [place] Physician Tor Netters- NP Contact Type Call Who Is Calling Physician / Provider / Hospital Call Type Provider Call [facility] Page Now Reason for Call Request to speak to Physician Initial Comment Caller states he is [place] from the patient care center at [facility],. He is calling about a mutual patient came in today for an iron infusion. He wants to give an update on the patient. Additional Comment Patient Name Carla Lyons Patient DOB 06-11-1941 Requesting Provider [place] Physician Number [phone] Facility Name [name]. Time Disposition Final User 12/05/2019 5:18:39 PM Send to [place], [place] 12/05/2019 5:26:23 PM Paged On Call back to Call [place], [place] 12/05/2019 5:34:31 PM Page Completed Yes Lamar Blinks, [place] Phone DateTime Result/Outcome Message Type Notes Kathlene November - [place] [id number] 12/05/2019 5:26:23 PM Paged On Call Back to Call Center Doctor Paged This is [place] with the call center. We have a page here for you. If you could please call us at [phone]. Kathlene November - MD 12/05/2019 5:34:25 PM Spoke with On Call - General Message Result Spoke with the on call and tried to connect him with the physician. Unable to connect the physician because a wrong number was given. Call Closed By: Roxanna Mew Transaction Date/Time: 12/05/2019 5:15:33 PM (ET)

## 2019-12-06 NOTE — Telephone Encounter (Signed)
[facility] Night - Client Nonclinical Telephone Record AccessNurse Client [place] [facility] Night - Client Client Site [place] Physician Tor Netters- NP Contact Type Call Who Is Calling Physician / Provider / Hospital Call Type Provider Call [facility] Page Now Reason for Call Request to speak to Physician Initial Comment Caller states that he called earlier to update on a mutual patient who had been seen for an iron infusion. Caller states that he is unsure if the on-call had called back and gotten voicemail and asked to have the on-call paged again. Additional Comment Patient Name Carla Lyons Patient DOB 07-02-1941 Requesting Provider [place] Physician Number [phone] Facility Name Patient [name]. Time Disposition Final User 12/05/2019 5:52:41 PM Send to [place], Kathryn 12/05/2019 5:57:35 PM Paged On Call back to Call [place] 12/05/2019 6:02:30 PM Page Completed Yes Lamar Blinks, [place] Phone DateTime Result/Outcome Message Type Notes Kathlene November - [place] [id number] 12/05/2019 5:57:35 PM Paged On Call Back to Call Center Doctor Paged Please call your answering service at [phone] regarding a page. Kathlene November - MD 12/05/2019 6:02:26 PM Spoke with On Call - General Message Result Connected the on call with the physician. Call Closed By: Roxanna Mew Transaction Date/Time: 12/05/2019 5:48:07 PM (ET)

## 2019-12-06 NOTE — Op Note (Signed)
Carla Lyons Patient Name: Carla Lyons Procedure Date: 12/06/2019 8:34 AM MRN: [id number] Endoscopist: Gatha Mayer , MD Age: 79 Referring MD:  Date of Birth: [birth year] Gender: Female Account #: [id number] Procedure:                Upper GI endoscopy Indications:              Iron deficiency anemia Medicines:                Propofol per Anesthesia, Monitored Anesthesia Care Procedure:                Pre-Anesthesia Assessment:                           - Prior to the procedure, a History and Physical                            was performed, and patient medications and                            allergies were reviewed. The patient's tolerance of                            previous anesthesia was also reviewed. The risks                            and benefits of the procedure and the sedation                            options and risks were discussed with the patient.                            All questions were answered, and informed consent                            was obtained. Prior Anticoagulants: The patient has                            taken no previous anticoagulant or antiplatelet                            agents. ASA Grade Assessment: II - A patient with                            mild systemic disease. After reviewing the risks                            and benefits, the patient was deemed in                            satisfactory condition to undergo the procedure.                           After obtaining informed consent, the endoscope was  passed under direct vision. Throughout the                            procedure, the patient's blood pressure, pulse, and                            oxygen saturations were monitored continuously. The                            Endoscope was introduced through the mouth, and                            advanced to the second part of duodenum. The upper                            GI endoscopy  was accomplished without difficulty.                            The patient tolerated the procedure well. Scope In: Scope Out: Findings:                 A large hiatal hernia with multiple Cameron ulcers                            was found. The hiatal narrowing was 38 cm from the                            incisors. The Z-line was 30 cm from the incisors.                           Multiple dispersed diminutive erosions with                            stigmata of recent bleeding were found in the                            gastric body. Biopsies were taken with a cold                            forceps for histology. Verification of patient                            identification for the specimen was done. Estimated                            blood loss was minimal.                           The exam was otherwise without abnormality.                           The cardia and gastric fundus were normal on  retroflexion. Complications:            No immediate complications. Estimated Blood Loss:     Estimated blood loss was minimal. Impression:               - Large hiatal hernia with multiple Cameron ulcers.                           - Erosive gastropathy with stigmata of recent                            bleeding. Biopsied. More distral body - not w/ HH                           - The examination was otherwise normal. Recommendation:           - Patient has a contact number available for                            emergencies. The signs and symptoms of potential                            delayed complications were discussed with the                            patient. Return to normal activities tomorrow.                            Written discharge instructions were provided to the                            patient.                           - Resume previous diet.                           - Continue present medications.                           - Await  pathology results.                           - Start omeprazole 40 mg/day - some of the distal                            erosions likely from ASA                           Tx H pylori if present                           Chronic po/parenteral iron support                           Do not think surgery makes sense Gatha Mayer, MD 12/06/2019 9:14:28 AM This report has been signed electronically.

## 2019-12-06 NOTE — Patient Instructions (Addendum)
I found the problem. You have a large hiatal hernia and where the diaphragm squeezes the stomach it is causing ulcers or erosions that leak blood. The only way to fix this is to have surgery and I do not think that makes sense in you.  We can treat with acid-blocking medication (I prescribed omeprazole today) and treat infection if present (took biopsies) but will still need iron treatment by mouth and perhaps by vein again.  I will contact you with results and other recommendations.  I appreciate the opportunity to care for you. Gatha Mayer, MD, FACG YOU HAD AN ENDOSCOPIC PROCEDURE TODAY AT [facility]:   Refer to the procedure report that was given to you for any specific questions about what was found during the examination.  If the procedure report does not answer your questions, please call your gastroenterologist to clarify.  If you requested that your care partner not be given the details of your procedure findings, then the procedure report has been included in a sealed envelope for you to review at your convenience later.  YOU SHOULD EXPECT: Some feelings of bloating in the abdomen. Passage of more gas than usual.  Walking can help get rid of the air that was put into your GI tract during the procedure and reduce the bloating. If you had a lower endoscopy (such as a colonoscopy or flexible sigmoidoscopy) you may notice spotting of blood in your stool or on the toilet paper. If you underwent a bowel prep for your procedure, you may not have a normal bowel movement for a few days.  Please Note:  You might notice some irritation and congestion in your nose or some drainage.  This is from the oxygen used during your procedure.  There is no need for concern and it should clear up in a day or so.  SYMPTOMS TO REPORT IMMEDIATELY:    Following upper endoscopy (EGD)  Vomiting of blood or coffee ground material  New chest pain or pain under the shoulder blades  Painful or  persistently difficult swallowing  New shortness of breath  Fever of 100F or higher  Black, tarry-looking stools  For urgent or emergent issues, a gastroenterologist can be reached at any hour by calling [phone].   DIET:  We do recommend a small meal at first, but then you may proceed to your regular diet.  Drink plenty of fluids but you should avoid alcoholic beverages for 24 hours.  ACTIVITY:  You should plan to take it easy for the rest of today and you should NOT DRIVE or use heavy machinery until tomorrow (because of the sedation medicines used during the test).    FOLLOW UP: Our staff will call the number listed on your records 48-72 hours following your procedure to check on you and address any questions or concerns that you may have regarding the information given to you following your procedure. If we do not reach you, we will leave a message.  We will attempt to reach you two times.  During this call, we will ask if you have developed any symptoms of COVID 19. If you develop any symptoms (ie: fever, flu-like symptoms, shortness of breath, cough etc.) before then, please call [phone].  If you test positive for Covid 19 in the 2 weeks post procedure, please call and report this information to [place].    If any biopsies were taken you will be contacted by phone or by letter within the next 1-3  weeks.  Please call us at [phone] if you have not heard about the biopsies in 3 weeks.    SIGNATURES/CONFIDENTIALITY: You and/or your care partner have signed paperwork which will be entered into your electronic medical record.  These signatures attest to the fact that that the information above on your After Visit Summary has been reviewed and is understood.  Full responsibility of the confidentiality of this discharge information lies with you and/or your care-partner.

## 2019-12-06 NOTE — Progress Notes (Signed)
Temp check by:JB Vital check by:DT  The patient states no changes in medical or surgical history since pre-visit screening on 11/30/19.

## 2019-12-06 NOTE — Progress Notes (Signed)
Patient discharged to home from [facility] with daughter. No complaint of pain or discomfort at time of discharge. Patient stated headache rating  " 2 " on a scale of 1 - 10. Patient instructed to call [facility] or go to the ED if headache pain becomes worse. Dr. Carlean Purl approved patient for discharge, in to see patient in recovery prior to discharge. B.Kamisha Ell RN.

## 2019-12-06 NOTE — Progress Notes (Signed)
A and O x3. Report to RN. Tolerated MAC anesthesia well.Gums unchanged after procedure. 

## 2019-12-08 ENCOUNTER — Telehealth: Payer: Self-pay | Admitting: *Deleted

## 2019-12-08 NOTE — Telephone Encounter (Signed)
  Follow up Call-  Call back number 12/06/2019 11/17/2018  Post procedure Call Back phone  # [phone]  Permission to leave phone message Yes Yes  Some recent data might be hidden     Patient questions:  Do you have a fever, pain , or abdominal swelling? No. Pain Score  0 *  Have you tolerated food without any problems? Yes.    Have you been able to return to your normal activities? Yes.    Do you have any questions about your discharge instructions: Diet   No. Medications  No. Follow up visit  No.  Do you have questions or concerns about your Care? No.  Actions: * If pain score is 4 or above: No action needed, pain <4.  1. Have you developed a fever since your procedure? no  2.   Have you had an respiratory symptoms (SOB or cough) since your procedure? no  3.   Have you tested positive for COVID 19 since your procedure no  4.   Have you had any family members/close contacts diagnosed with the COVID 19 since your procedure?  no   If yes to any of these questions please route to Joylene John, RN and Alphonsa Gin, Therapist, sports.

## 2019-12-09 NOTE — Telephone Encounter (Addendum)
ATC, line rang several times.   Documentation not saved from previous call back two days ago.   Unable to reach anyone at this time after several attempts.  It looks as though GI has reached out to the patient regarding her infusions.   Will close this encounter. If anything further needed regarding the patients care a new phone note will be created when they call back.

## 2019-12-14 ENCOUNTER — Encounter: Payer: Medicare Other | Attending: Family Medicine | Admitting: Skilled Nursing Facility1

## 2019-12-14 ENCOUNTER — Other Ambulatory Visit: Payer: Self-pay

## 2019-12-14 ENCOUNTER — Encounter: Payer: Self-pay | Admitting: Skilled Nursing Facility1

## 2019-12-14 DIAGNOSIS — N1832 Chronic kidney disease, stage 3b: Secondary | ICD-10-CM | POA: Insufficient documentation

## 2019-12-14 DIAGNOSIS — Z9189 Other specified personal risk factors, not elsewhere classified: Secondary | ICD-10-CM | POA: Insufficient documentation

## 2019-12-14 DIAGNOSIS — K5792 Diverticulitis of intestine, part unspecified, without perforation or abscess without bleeding: Secondary | ICD-10-CM | POA: Diagnosis not present

## 2019-12-14 DIAGNOSIS — D631 Anemia in chronic kidney disease: Secondary | ICD-10-CM | POA: Insufficient documentation

## 2019-12-14 DIAGNOSIS — Z713 Dietary counseling and surveillance: Secondary | ICD-10-CM | POA: Insufficient documentation

## 2019-12-14 NOTE — Patient Instructions (Addendum)
-  Try brewed green tea with lemon and honey  -Try adding in some light weights a couple times a week  -Create balanced plant based meals   -Avoid highly processed foods and drinks such as lunch meat and bottled drinks   -4 ounces of 100% fruit juice is okay  -Aim for a minimum 64 fluid ounces per day; 32 being plain water

## 2019-12-14 NOTE — Progress Notes (Signed)
  Assessment:  Primary concerns today: CKD stage 3.   Pt states she has renal disease and diverticulosis stating they get in the way of her being able to eat the foods she wants to. Pt states she has not had a flair up of diverticulitis in many years.  Pt is very frustrated.  Pt was referencing information from 2007 she got from a friend for her diet recomendations.  Pt states she has a large hiatal hernia.  Pt was extremely dishearented at the idea of avoiding foods she enjoys and bottled drinks she enjoys.  Pt states she absolutely loves beef liver. Pt states she does have a bowel movement every day. Pt states she does not have any regurgitation or reflux.  Pts Potassium WNL.    MEDICATIONS: see list   DIETARY INTAKE:  Usual eating pattern includes 3 meals and 1 snacks per day.  Everyday foods include none stated.  Avoided foods include sereral.    24-hr recall:  B ( AM): 1 boiled egg 1 toast with low fat butter and fruit or applesauce or splenda maple instant oatmeal and sugar free fruit cup Snk ( AM): oatmeal cookie or oreo cookie L ( PM): white wheat bread with boiled chicken with low fat chips or apple or soup Snk ( PM):  D ( PM): grilled chicken with bbq sauce or mayo with canned green beans  Snk ( PM): slice of craft cheese and crackers Beverages: propel, Gatorade, lipton green tea, water  Usual physical activity: walking 2 times a day treadmill or walking property     Intervention:  Nutrition counseling. Dietitian attempted to educate pt on plant based renal friendly diet.Pt had difficutly believing dietitians recommendations per her preconceived notions of how she should be eating with her conditions. Dietitian educated pt on how to read the label and what to look for (phos and potassium).   Goals: If you experience bloat or GI discomfort with certain foods avoid them other wise there is no evidence to support avoiding certain foods for the sake of diverticulosis, in fact  increased fiber is the intervention for diverticulosis  -Try brewed green tea with lemon and honey -Try adding in some light weights a couple times a week -Create balanced plant based meals  -Avoid highly processed foods and drinks such as lunch meat and bottled drinks  -4 ounces of 100% fruit juice is okay -Aim for a minimum 64 fluid ounces per day; 32 being plain water -Avoid packaged foods with phos or potassium in the ingredients   Teaching Method Utilized:  Visual Auditory Hands on  Handouts given during visit include:  MyPlate  Barriers to learning/adherence to lifestyle change: preconceived notions of how to eat with her disease state  Demonstrated degree of understanding via:  Teach Back   Monitoring/Evaluation:  Dietary intake, exercise

## 2019-12-22 ENCOUNTER — Other Ambulatory Visit: Payer: Self-pay | Admitting: Family Medicine

## 2019-12-23 ENCOUNTER — Ambulatory Visit (INDEPENDENT_AMBULATORY_CARE_PROVIDER_SITE_OTHER): Payer: Medicare Other | Admitting: Primary Care

## 2019-12-23 DIAGNOSIS — J069 Acute upper respiratory infection, unspecified: Secondary | ICD-10-CM | POA: Insufficient documentation

## 2019-12-23 MED ORDER — BENZONATATE 200 MG PO CAPS: 200.0000 mg | ORAL_CAPSULE | Freq: Three times a day (TID) | ORAL | 0 refills | Status: DC | PRN

## 2019-12-23 MED ORDER — MUCINEX 600 MG PO TB12: 600.0000 mg | ORAL_TABLET | Freq: Two times a day (BID) | ORAL | 0 refills | Status: DC

## 2019-12-23 NOTE — Progress Notes (Signed)
Subjective:    Patient ID: Carla Lyons, female    DOB: May 11, 1941, 79 y.o.   MRN: [id number]  HPI     Carla Lyons - 79 y.o. female  MRN [id number]  Date of Birth: 02/05/1941  PCP: Elby Beck, FNP  This service was provided via telemedicine. Phone Visit performed on 12/23/2019    Rationale for phone visit along with limitations reviewed. Patient consented to telephone encounter.    Location of patient: Home Location of provider: Office at [employer] @ [facility] Name of referring provider: N/A   Names of persons and role in encounter: Provider: Pleas Koch, NP  Patient: Carla Lyons  Other: N/A   Time on call: 10 min - 02 sec   Subjective: No chief complaint on file.    HPI:  [name] is a 79 year old female patient of Carla Lyons with a history of AVM, hypertension, venous insufficiency, hypothyroidism, CKD, allergic rhinitis, anemia, GERD who presents today with a chief complaint of cough.  Her symptoms began two days ago with a sore throat and dull headache, she then began to experience laryngitis and cough. Her cough is non productive. Overall she feels well, but the cough is bothersome, feels like she has chest congestion and can't get it out.  She denies fevers, loss of taste or smell, diarrhea, known exposure to Covid-19. She's had both vaccines for Covid-19.  She's been taking Tylenol for her headache with improvement, also using Delsym cough syrup with some improvement. She would like something prescription strength for cough, has an allergy to codeine.   Objective/Observations:  No physical exam or vital signs collected unless specifically identified below.   There were no vitals taken for this visit.   Respiratory status: speaks in complete sentences without evident shortness of breath.   Assessment/Plan:  Sore throat, hoarse voice, cough x 2 days. Has had Covid-19 vaccines with last vaccine given one month ago. She sounds stable, dry cough  during exam.  At this point suspect more viral etiology but cannot completely rule out with a phone visit. She feels well which is reassuring. ACE-I could be aggravating cough.  Rx for [employer] and guaifenesin sent to pharmacy. Discussed importance of hydration with water.  Strict urgent care precautions provided.  No problem-specific Assessment & Plan notes found for this encounter.   I discussed the assessment and treatment plan with the patient. The patient was provided an opportunity to ask questions and all were answered. The patient agreed with the plan and demonstrated an understanding of the instructions.  Lab Orders  No laboratory test(s) ordered today    No orders of the defined types were placed in this encounter.   The patient was advised to call back or seek an in-person evaluation if the symptoms worsen or if the condition fails to improve as anticipated.  Pleas Koch, NP    Review of Systems  Constitutional: Negative for chills, fatigue and fever.  HENT: Positive for congestion, sore throat and voice change.   Respiratory: Positive for cough.        Past Medical History:  Diagnosis Date  . Allergy   . 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 Cataract    REMOVED  . 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 Colon polyps 06-15-07  . Diverticulosis    hx of  . Hyperlipidemia   . Hypertension   . Iron deficiency anemia due to chronic blood loss 12/06/2019  .  Large hiatal hernia 12/06/2019  . Osteoporosis   . Personal history of colonic adenomas 07/19/2007  . Renal insufficiency   . Thyroid disease      Social History   Socioeconomic History  . Marital status: Widowed    Spouse name: Not on file  . Number of children: 2  . Years of education: Not on file  . Highest education level: Not on file  Occupational History  . Occupation: Retired     Fish farm manager: RETIRED  Tobacco Use  . Smoking status: Former Smoker      Types: Cigarettes    Quit date: 10/07/2001    Years since quitting: 18.2  . Smokeless tobacco: Never Used  Substance and Sexual Activity  . Alcohol use: Yes    Alcohol/week: 1.0 standard drinks    Types: 1 Glasses of wine per week    Comment: rare, LESS than weekly  . Drug use: No  . Sexual activity: Not on file  Other Topics Concern  . Not on file  Social History Narrative   Does have a living will.   Daughter is HPOA- does not want any extra ordinary measures.  Would want CPR.   Social Determinants of Health   Financial Resource Strain:   . Difficulty of Paying Living Expenses:   Food Insecurity:   . Worried About Charity fundraiser in the Last Year:   . Arboriculturist in the Last Year:   Transportation Needs:   . Film/video editor (Medical):   Marland Kitchen Lack of Transportation (Non-Medical):   Physical Activity:   . Days of Exercise per Week:   . Minutes of Exercise per Session:   Stress:   . Feeling of Stress :   Social Connections:   . Frequency of Communication with Friends and Family:   . Frequency of Social Gatherings with Friends and Family:   . Attends Religious Services:   . Active Member of Clubs or Organizations:   . Attends Archivist Meetings:   Marland Kitchen Marital Status:   Intimate Partner Violence:   . Fear of Current or Ex-Partner:   . Emotionally Abused:   Marland Kitchen Physically Abused:   . Sexually Abused:     Past Surgical History:  Procedure Laterality Date  . APPENDECTOMY    . CATARACT EXTRACTION W/ INTRAOCULAR LENS IMPLANT Right 01/08/2018  . CATARACT EXTRACTION W/ INTRAOCULAR LENS IMPLANT Left 02/05/2018  . CESAREAN SECTION     x 2  . COLONOSCOPY  11/17/2018  . PARTIAL HYSTERECTOMY  age 44  . VAGINAL HYSTERECTOMY  age 60   partial    Family History  Problem Relation Age of Onset  . Coronary artery disease Mother   . Other Mother        bypass surgery  . Dementia Mother   . Osteoarthritis Father   . Heart disease Father   . Heart  attack Maternal Grandfather   . Lung disease Brother   . Hypertension Brother   . Lung disease Sister   . Lung disease Sister   . Colon cancer Neg Hx   . Esophageal cancer Neg Hx   . Rectal cancer Neg Hx   . Stomach cancer Neg Hx   . Colon polyps Neg Hx     Allergies  Allergen Reactions  . Fosamax [Alendronate Sodium]     Red and hot all over  . Niacin And Related     Rash and red all over  . Codeine Phosphate  REACTION: Nausea/vomiting    Current Outpatient Medications on File Prior to Visit  Medication Sig Dispense Refill  . ALPRAZolam (XANAX) 0.25 MG tablet Take 1 tablet (0.25 mg total) by mouth at bedtime. 30 tablet 1  . aspirin (ADULT ASPIRIN EC LOW STRENGTH) 81 MG EC tablet Take 81 mg by mouth daily.     Marland Kitchen atorvastatin (LIPITOR) 20 MG tablet TAKE 1 TABLET BY MOUTH EVERY DAY 30 tablet 5  . b complex vitamins tablet Take 1 tablet by mouth daily.    . benazepril (LOTENSIN) 10 MG tablet TAKE 1 TABLET BY MOUTH EVERY DAY 30 tablet 5  . BIOTIN 5000 PO Take 10,000 mcg by mouth daily.    . cholecalciferol (VITAMIN D) 1000 UNITS tablet Take 1,000 Units by mouth 2 (two) times daily.     . Cyanocobalamin 1000 MCG SUBL Place 1 tablet under the tongue daily.    Marland Kitchen FERROUS SULFATE PO Take 1 tablet by mouth daily. 5 grams.  Take one tablet at bedtime with orange juice    . furosemide (LASIX) 20 MG tablet Take 20 mg by mouth as needed for edema.    . hydrochlorothiazide (HYDRODIURIL) 12.5 MG tablet as needed.     . iron dextran complex in sodium chloride 0.9 % 500 mL Inject 1,000 mg into the vein once. Starting on 12/06/19    . levothyroxine (SYNTHROID) 88 MCG tablet TAKE 1 TABLET BY MOUTH EVERY DAY 90 tablet 1  . Melatonin 3 MG TABS Take 1 tablet by mouth at bedtime as needed.    . Omega-3 Fatty Acids (FISH OIL) 1000 MG CAPS Take by mouth. Take two daily     . omeprazole (PRILOSEC) 40 MG capsule Take 1 capsule (40 mg total) by mouth daily before breakfast. 90 capsule 3  . valACYclovir  (VALTREX) 1000 MG tablet Take 1 tablet (1,000 mg total) by mouth 2 (two) times daily. For two doses as needed for cold sores. 10 tablet 1   No current facility-administered medications on file prior to visit.    There were no vitals taken for this visit.   Objective:   Physical Exam  Constitutional: She is oriented to person, place, and time.  Respiratory: Effort normal.  Dry cough noted several times during phone visit  Neurological: She is alert and oriented to person, place, and time.           Assessment & Plan:

## 2019-12-23 NOTE — Patient Instructions (Signed)
Your symptoms are representative of a viral illness which will resolve on its own over time. Our goal is to treat your symptoms in order to aid your body in the healing process and to make you more comfortable.   You may take Benzonatate capsules for cough. Take 1 capsule by mouth three times daily as needed for cough.  You can take the Mucinex twice daily with water for chest congestion.  Please go to the Urgent care if your breathing gets bad, you develop fevers of 100.8 or higher, you start to feel really tired.   It was a pleasure meeting you!

## 2019-12-23 NOTE — Assessment & Plan Note (Signed)
Sore throat, hoarse voice, cough x 2 days. Has had Covid-19 vaccines with last vaccine given one month ago. She sounds stable, dry cough during exam.  At this point suspect more viral etiology but cannot completely rule out with a phone visit. She feels well which is reassuring. ACE-I could be aggravating cough.  Rx for [employer] and guaifenesin sent to pharmacy. Discussed importance of hydration with water.  Strict urgent care precautions provided.

## 2019-12-27 ENCOUNTER — Telehealth: Payer: Self-pay

## 2019-12-27 NOTE — Telephone Encounter (Signed)
Pt had virtual visit on 12/23/19; pt has been taking the mucinex which has not helped and benzonatate which has helped cough slightly. At times pt has productive cough with thick green phlegm and other times has dry cough and nothing will come up; pt has a lot of chest congestion and feels tight in chest. No CP; pt is sore in chest and back on both sides of chest. No fever this morning T 98.1  Pt has had dull H/A for 1 wk; SOB upon exertion on and off. Pt has hoarseness now but no S/T now. Pt said she feels like she is worsening; pt will go to [facility] in now for eval and possible CXR.([facility] in [place] does not have xray capability until first of May) FYI to Gentry Fitz NP and Glenda Chroman FNP.

## 2019-12-27 NOTE — Telephone Encounter (Signed)
Noted and agree to UC evaluation.

## 2019-12-28 NOTE — Telephone Encounter (Signed)
Pt called with update after going to [facility] on 12/27/19;pt was given abx, prednisone and an inhaler and was dx with bronchitis. Pt said she started the prednisone on 12/27/19 and pt can tell a slight improvement in how she feels. Pt will cb if needed. FYI to Gentry Fitz NP and Glenda Chroman FNP.

## 2019-12-28 NOTE — Telephone Encounter (Signed)
Noted.  Thank you for the followup.

## 2020-01-25 ENCOUNTER — Other Ambulatory Visit (INDEPENDENT_AMBULATORY_CARE_PROVIDER_SITE_OTHER): Payer: Medicare Other

## 2020-01-25 ENCOUNTER — Ambulatory Visit (INDEPENDENT_AMBULATORY_CARE_PROVIDER_SITE_OTHER): Payer: Medicare Other | Admitting: Internal Medicine

## 2020-01-25 ENCOUNTER — Encounter: Payer: Self-pay | Admitting: Internal Medicine

## 2020-01-25 VITALS — BP 148/72 | HR 60 | Temp 97.7°F | Ht 63.0 in | Wt 163.2 lb

## 2020-01-25 DIAGNOSIS — K257 Chronic gastric ulcer without hemorrhage or perforation: Secondary | ICD-10-CM

## 2020-01-25 DIAGNOSIS — D500 Iron deficiency anemia secondary to blood loss (chronic): Secondary | ICD-10-CM

## 2020-01-25 LAB — CBC WITH DIFFERENTIAL/PLATELET
Basophils Absolute: 0.1 10*3/uL (ref 0.0–0.1)
Basophils Relative: 0.9 % (ref 0.0–3.0)
Eosinophils Absolute: 0.3 10*3/uL (ref 0.0–0.7)
Eosinophils Relative: 3.6 % (ref 0.0–5.0)
HCT: 36.8 % (ref 36.0–46.0)
Hemoglobin: 11.9 g/dL — ABNORMAL LOW (ref 12.0–15.0)
Lymphocytes Relative: 18.6 % (ref 12.0–46.0)
Lymphs Abs: 1.4 10*3/uL (ref 0.7–4.0)
MCHC: 32.3 g/dL (ref 30.0–36.0)
MCV: 102.5 fl — ABNORMAL HIGH (ref 78.0–100.0)
Monocytes Absolute: 0.8 10*3/uL (ref 0.1–1.0)
Monocytes Relative: 11.3 % (ref 3.0–12.0)
Neutro Abs: 4.9 10*3/uL (ref 1.4–7.7)
Neutrophils Relative %: 65.6 % (ref 43.0–77.0)
Platelets: 207 10*3/uL (ref 150.0–400.0)
RBC: 3.59 Mil/uL — ABNORMAL LOW (ref 3.87–5.11)
RDW: 13.2 % (ref 11.5–15.5)
WBC: 7.4 10*3/uL (ref 4.0–10.5)

## 2020-01-25 LAB — FERRITIN: Ferritin: 187.8 ng/mL (ref 10.0–291.0)

## 2020-01-25 NOTE — Assessment & Plan Note (Addendum)
Status post iron infusion 12/05/2019, on ferrous sulfate.  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  My plan is to follow-up CBC and ferritin today.  This will guide next steps.  I think as long as ferritin and hemoglobin are normal would continue with chronic iron supplementation orally and parenterally as needed.  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  She will need CBC and ferritin 3-4 times a year perhaps as few as twice a year depending upon the trajectory.

## 2020-01-25 NOTE — Assessment & Plan Note (Signed)
I reviewed this in detail with her today and gave her a hiatal hernia handout.  The only definitive treatment would be surgery but her age and with lack of other problems associated with a hiatal hernia I do not think it makes sense to pursue that.

## 2020-01-25 NOTE — Patient Instructions (Addendum)
If you are age 78 or older, your body mass index should be between 23-30. Your Body mass index is 28.91 kg/m. If this is out of the aforementioned range listed, please consider follow up with your Primary Care Provider.  If you are age 39 or younger, your body mass index should be between 19-25. Your Body mass index is 28.91 kg/m. If this is out of the aformentioned range listed, please consider follow up with your Primary Care Provider.   Your provider has requested that you go to the basement level for lab work before leaving today. Press "B" on the elevator. The lab is located at the first door on the left as you exit the elevator.   Follow up as needed.

## 2020-01-25 NOTE — Progress Notes (Signed)
Kieva Bartelme 79 y.o. 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  The only definitive treatment would be surgery but her age and with lack of other problems associated with a hiatal hernia I do not think it makes sense to pursue that.  Iron deficiency anemia due to chronic blood loss Status post iron infusion 12/05/2019, on ferrous sulfate.  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  My plan is to follow-up CBC and ferritin today.  This will guide next steps.  I think as long as ferritin and hemoglobin are normal would continue with chronic iron supplementation orally and parenterally as needed.  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  She will need CBC and ferritin 3-4 times a year perhaps as few as twice a year depending upon the trajectory.   I appreciate the opportunity to care for this patient. CC: Elby Beck, FNP   Subjective:   Chief Complaint: Follow-up of hiatal hernia and iron deficiency anemia  HPI Creasie is here for follow-up, she has iron deficiency anemia due to chronic blood loss, mostly from a large hiatal hernia with Cameron's erosions though she does have an AVM in her colon that has not been ablated.  EGD in March on the second demonstrated this.  The ulcers were biopsied they are benign no H. pylori.  She is currently on ferrous sulfate and PPI and had an iron infusion right before her EGD, that was on 12/05/2019.  She feels well.  We reviewed what a hiatal hernia is and signs and symptoms of such which she has no chest pain or dysphagia. Allergies  Allergen Reactions  . Fosamax [Alendronate Sodium]     Red and hot all over  . Niacin And Related     Rash and red all over  . Codeine Phosphate     REACTION: Nausea/vomiting   Current Meds  Medication Sig  . ALPRAZolam (XANAX) 0.25 MG tablet Take 1 tablet (0.25 mg total) by mouth at bedtime.  Marland Kitchen aspirin (ADULT ASPIRIN EC LOW STRENGTH) 81 MG EC tablet Take 81 mg by mouth daily.   Marland Kitchen atorvastatin (LIPITOR) 20 MG tablet TAKE 1 TABLET BY MOUTH EVERY DAY  . b complex vitamins tablet Take 1 tablet by mouth daily.  . benazepril (LOTENSIN) 10 MG tablet TAKE 1 TABLET BY MOUTH EVERY DAY  . benzonatate (TESSALON) 200 MG capsule Take 1 capsule (200 mg total) by mouth 3 (three) times daily as needed for cough.  Marland Kitchen BIOTIN 5000 PO Take 10,000 mcg by mouth daily.  . cholecalciferol (VITAMIN D) 1000 UNITS tablet Take 1,000 Units by mouth 2 (two) times daily.   . Cyanocobalamin 1000 MCG SUBL Place 1 tablet under the tongue daily.  Marland Kitchen FERROUS SULFATE PO Take 1 tablet by mouth daily. 5 grams.  Take one tablet at bedtime with orange juice  . furosemide (LASIX) 20 MG tablet Take 20 mg by mouth as needed for edema.  . hydrochlorothiazide (HYDRODIURIL) 12.5 MG tablet as needed.   . iron dextran complex in sodium chloride 0.9 % 500 mL Inject 1,000 mg into the vein once. Starting on 12/06/19  . levothyroxine (SYNTHROID) 88 MCG tablet TAKE 1 TABLET BY MOUTH EVERY DAY  .  Melatonin 3 MG TABS Take 1 tablet by mouth at bedtime as needed.  . Omega-3 Fatty Acids (FISH OIL) 1000 MG CAPS Take by mouth. Take two daily   . omeprazole (PRILOSEC) 40 MG capsule Take 1 capsule (40 mg total) by mouth daily before breakfast.  . valACYclovir (VALTREX) 1000 MG tablet Take 1 tablet (1,000 mg total) by mouth 2 (two) times daily. For two doses as needed for cold sores.   Past Medical History:  Diagnosis Date  . Allergy   . 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 Cataract    REMOVED  . 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 Colon polyps 06-15-07  . Diverticulosis    hx of  . Hyperlipidemia   . Hypertension   . Iron deficiency anemia due to  chronic blood loss 12/06/2019  . Large hiatal hernia 12/06/2019  . Osteoporosis   . Personal history of colonic adenomas 07/19/2007  . Renal insufficiency   . Thyroid disease    Past Surgical History:  Procedure Laterality Date  . APPENDECTOMY    . CATARACT EXTRACTION W/ INTRAOCULAR LENS IMPLANT Right 01/08/2018  . CATARACT EXTRACTION W/ INTRAOCULAR LENS IMPLANT Left 02/05/2018  . CESAREAN SECTION     x 2  . COLONOSCOPY  11/17/2018  . ESOPHAGOGASTRODUODENOSCOPY ENDOSCOPY    . PARTIAL HYSTERECTOMY  age 39  . VAGINAL HYSTERECTOMY  age 30   partial   Social History   Social History Narrative   Does have a living will.   Daughter is HPOA- does not want any extra ordinary measures.  Would want CPR.   family history includes Coronary artery disease in her mother; Dementia in her mother; Heart attack in her maternal grandfather; Heart disease in her father; Hypertension in her brother; Lung disease in her brother, sister, and sister; Osteoarthritis in her father; Other in her mother.   Review of Systems As above Objective:   Physical Exam BP (!) 148/72   Pulse 60   Temp 97.7 F (36.5 C)   Ht 5\' 3"  (1.6 m)   Wt 163 lb 3.2 oz (74 kg)   BMI 28.91 kg/m

## 2020-02-22 ENCOUNTER — Ambulatory Visit (INDEPENDENT_AMBULATORY_CARE_PROVIDER_SITE_OTHER): Payer: Medicare Other | Admitting: Family Medicine

## 2020-02-22 ENCOUNTER — Ambulatory Visit (INDEPENDENT_AMBULATORY_CARE_PROVIDER_SITE_OTHER)
Admission: RE | Admit: 2020-02-22 | Discharge: 2020-02-22 | Disposition: A | Discharge status: Home / Self Care | Payer: Medicare Other | Source: Ambulatory Visit | Attending: Family Medicine | Admitting: Family Medicine

## 2020-02-22 ENCOUNTER — Other Ambulatory Visit: Payer: Self-pay

## 2020-02-22 ENCOUNTER — Encounter: Payer: Self-pay | Admitting: Family Medicine

## 2020-02-22 VITALS — BP 148/72 | HR 61 | Temp 98.2°F | Ht 63.0 in | Wt 165.5 lb

## 2020-02-22 DIAGNOSIS — M7502 Adhesive capsulitis of left shoulder: Secondary | ICD-10-CM

## 2020-02-22 DIAGNOSIS — M25512 Pain in left shoulder: Secondary | ICD-10-CM

## 2020-02-22 DIAGNOSIS — G8929 Other chronic pain: Secondary | ICD-10-CM

## 2020-02-22 MED ORDER — METHYLPREDNISOLONE ACETATE 40 MG/ML IJ SUSP
80.0000 mg | Freq: Once | INTRAMUSCULAR | Status: AC
  Administered 2020-02-22: 80 mg via INTRA_ARTICULAR

## 2020-02-22 NOTE — Progress Notes (Signed)
Spencer T. Copland, MD, [facility]  Primary Care and [place] at [facility] [place], [address]  Phone: [phone]  FAX: [phone]  Carla Lyons - 79 y.o. female  MRN [id number]  Date of Birth: 11/27/1940  Date: 02/22/2020  PCP: Elby Beck, FNP  Referral: Elby Beck, FNP  Chief Complaint  Patient presents with  . Shoulder Pain    Left    This visit occurred during the SARS-CoV-2 public health emergency.  Safety protocols were in place, including screening questions prior to the visit, additional usage of staff PPE, and extensive cleaning of exam room while observing appropriate contact time as indicated for disinfecting solutions.   Subjective:   Carla Lyons is a 79 y.o. very pleasant female patient with Body mass index is 29.32 kg/m. who presents with the following:  I saw the patient initially in January and at that point she was having frozen shoulder and had been symptomatic for approximately for 3 months.  At this point she has been symptomatic for approximately 7 points.  At this point I did do a intra-articular shoulder injection and had her begin range of motion protocol from [facility].  She reports initial compliance with her home rehab program, but ultimately she has stopped this for some time.  She has had a recurrence of her pain and some limitation of motion has resumed and cut back.  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 PULM: Breathing comfortably in no respiratory distress PSYCH: Normally interactive.   Left shoulder: Neurovascularly intact Abduction to 130 degrees Flexion to 130  degrees Internal range of motion 5 degrees and abduction at 90 External range of motion to 55 degrees and 90 degrees of abduction Strength 5/5 throughout  Laboratory and Imaging Data:  Assessment and Plan:     ICD-10-CM   1. Chronic left shoulder pain  M25.512 DG Shoulder Left   G89.29   2. Adhesive capsulitis of left shoulder  M75.02 methylPREDNISolone acetate (DEPO-MEDROL) injection 80 mg    DG Shoulder Left   Xrays: Shoulder series Indication: shoulder pain Findings: No evidence of occult fracture No significant glenohumeral arthritis AC joint: no arthropathy, space preserved Impingement pathology: none significant, Type I Acromium  Electronically Signed  By: Owens Loffler, MD On: 02/22/2020  3:40 PM EDT   Adhesive capsulitis, reviewed rehab with her again she will be getting in this weekend.  Intra-articular injection was given today.  Intraarticular Shoulder Aspiration/Injection Procedure Note Carla Lyons 04-08-1941 Date of procedure: 02/22/2020  Procedure: Large Joint Aspiration / Injection of Shoulder, Intraarticular, L Indications: Pain  Procedure Details Verbal consent was obtained from the patient. Risks including infection explained and contrasted with benefits and alternatives. Patient prepped with Chloraprep and Ethyl Chloride used for anesthesia. An intraarticular shoulder injection was performed using the posterior approach; needle placed into joint capsule without difficulty. The patient tolerated the procedure well and had decreased pain post injection. No complications. Injection: 8 cc of Lidocaine 1% and 2 mL Depo-Medrol 40 mg. Needle: 21 gauge, 2 inch   Follow-up: No  follow-ups on file.  Meds ordered this encounter  Medications  . methylPREDNISolone acetate (DEPO-MEDROL) injection 80 mg   There are no discontinued medications. Orders Placed This Encounter  Procedures  . DG Shoulder Left    Signed,  Frederico Hamman T. Copland, MD   Outpatient  Encounter Medications as of 02/22/2020  Medication Sig  . ALPRAZolam (XANAX) 0.25 MG tablet Take 1 tablet (0.25 mg total) by mouth at bedtime.  Marland Kitchen aspirin (ADULT ASPIRIN EC LOW STRENGTH) 81 MG EC tablet Take 81 mg by mouth daily.   Marland Kitchen atorvastatin (LIPITOR) 20 MG tablet TAKE 1 TABLET BY MOUTH EVERY DAY  . b complex vitamins tablet Take 1 tablet by mouth daily.  . benazepril (LOTENSIN) 10 MG tablet TAKE 1 TABLET BY MOUTH EVERY DAY  . benzonatate (TESSALON) 200 MG capsule Take 1 capsule (200 mg total) by mouth 3 (three) times daily as needed for cough.  Marland Kitchen BIOTIN 5000 PO Take 10,000 mcg by mouth daily.  . cholecalciferol (VITAMIN D) 1000 UNITS tablet Take 1,000 Units by mouth 2 (two) times daily.   . Cyanocobalamin 1000 MCG SUBL Place 1 tablet under the tongue daily.  Marland Kitchen FERROUS SULFATE PO Take 1 tablet by mouth daily. 5 grams.  Take one tablet at bedtime with orange juice  . furosemide (LASIX) 20 MG tablet Take 20 mg by mouth as needed for edema.  . hydrochlorothiazide (HYDRODIURIL) 12.5 MG tablet as needed.   . iron dextran complex in sodium chloride 0.9 % 500 mL Inject 1,000 mg into the vein once. Starting on 12/06/19  . levothyroxine (SYNTHROID) 88 MCG tablet TAKE 1 TABLET BY MOUTH EVERY DAY  . Melatonin 3 MG TABS Take 1 tablet by mouth at bedtime as needed.  . Omega-3 Fatty Acids (FISH OIL) 1000 MG CAPS Take by mouth. Take two daily   . omeprazole (PRILOSEC) 40 MG capsule Take 1 capsule (40 mg total) by mouth daily before breakfast.  . valACYclovir (VALTREX) 1000 MG tablet Take 1 tablet (1,000 mg total) by mouth 2 (two) times daily. For two doses as needed for cold sores.  . [EXPIRED] methylPREDNISolone acetate (DEPO-MEDROL) injection 80 mg    No facility-administered encounter medications on file as of 02/22/2020.

## 2020-03-01 ENCOUNTER — Other Ambulatory Visit: Payer: Self-pay | Admitting: Family Medicine

## 2020-03-01 DIAGNOSIS — N183 Chronic kidney disease, stage 3 unspecified: Secondary | ICD-10-CM

## 2020-03-01 DIAGNOSIS — I10 Essential (primary) hypertension: Secondary | ICD-10-CM

## 2020-03-02 ENCOUNTER — Observation Stay (HOSPITAL_COMMUNITY)
Admission: EM | Admit: 2020-03-02 | Discharge: 2020-03-04 | Disposition: A | Discharge status: Home / Self Care | Payer: Medicare Other | Attending: Internal Medicine | Admitting: Internal Medicine

## 2020-03-02 ENCOUNTER — Emergency Department (HOSPITAL_COMMUNITY): Payer: Medicare Other

## 2020-03-02 ENCOUNTER — Encounter (HOSPITAL_COMMUNITY): Payer: Self-pay

## 2020-03-02 DIAGNOSIS — Z79899 Other long term (current) drug therapy: Secondary | ICD-10-CM | POA: Diagnosis not present

## 2020-03-02 DIAGNOSIS — Z7982 Long term (current) use of aspirin: Secondary | ICD-10-CM | POA: Diagnosis not present

## 2020-03-02 DIAGNOSIS — R29818 Other symptoms and signs involving the nervous system: Secondary | ICD-10-CM

## 2020-03-02 DIAGNOSIS — E875 Hyperkalemia: Secondary | ICD-10-CM | POA: Diagnosis not present

## 2020-03-02 DIAGNOSIS — N1832 Chronic kidney disease, stage 3b: Secondary | ICD-10-CM | POA: Insufficient documentation

## 2020-03-02 DIAGNOSIS — I10 Essential (primary) hypertension: Secondary | ICD-10-CM | POA: Diagnosis present

## 2020-03-02 DIAGNOSIS — H43392 Other vitreous opacities, left eye: Secondary | ICD-10-CM | POA: Diagnosis present

## 2020-03-02 DIAGNOSIS — E785 Hyperlipidemia, unspecified: Secondary | ICD-10-CM | POA: Diagnosis present

## 2020-03-02 DIAGNOSIS — K219 Gastro-esophageal reflux disease without esophagitis: Secondary | ICD-10-CM | POA: Diagnosis not present

## 2020-03-02 DIAGNOSIS — I129 Hypertensive chronic kidney disease with stage 1 through stage 4 chronic kidney disease, or unspecified chronic kidney disease: Secondary | ICD-10-CM | POA: Diagnosis not present

## 2020-03-02 DIAGNOSIS — E782 Mixed hyperlipidemia: Secondary | ICD-10-CM | POA: Diagnosis present

## 2020-03-02 DIAGNOSIS — E039 Hypothyroidism, unspecified: Secondary | ICD-10-CM | POA: Diagnosis present

## 2020-03-02 DIAGNOSIS — Z20822 Contact with and (suspected) exposure to covid-19: Secondary | ICD-10-CM | POA: Insufficient documentation

## 2020-03-02 DIAGNOSIS — Z87891 Personal history of nicotine dependence: Secondary | ICD-10-CM | POA: Insufficient documentation

## 2020-03-02 DIAGNOSIS — N183 Chronic kidney disease, stage 3 unspecified: Secondary | ICD-10-CM | POA: Diagnosis present

## 2020-03-02 DIAGNOSIS — G459 Transient cerebral ischemic attack, unspecified: Principal | ICD-10-CM | POA: Insufficient documentation

## 2020-03-02 DIAGNOSIS — Z8673 Personal history of transient ischemic attack (TIA), and cerebral infarction without residual deficits: Secondary | ICD-10-CM | POA: Diagnosis present

## 2020-03-02 LAB — CBC
HCT: 40.1 % (ref 36.0–46.0)
Hemoglobin: 12.9 g/dL (ref 12.0–15.0)
MCH: 32.6 pg (ref 26.0–34.0)
MCHC: 32.2 g/dL (ref 30.0–36.0)
MCV: 101.3 fL — ABNORMAL HIGH (ref 80.0–100.0)
Platelets: 210 10*3/uL (ref 150–400)
RBC: 3.96 MIL/uL (ref 3.87–5.11)
RDW: 12.4 % (ref 11.5–15.5)
WBC: 8.4 10*3/uL (ref 4.0–10.5)
nRBC: 0 % (ref 0.0–0.2)

## 2020-03-02 LAB — ETHANOL: Alcohol, Ethyl (B): <10 mg/dL (ref <10)

## 2020-03-02 LAB — PROTIME-INR
INR: 1 (ref 0.8–1.2)
Prothrombin Time: 13.2 seconds (ref 11.4–15.2)

## 2020-03-02 LAB — DIFFERENTIAL
Abs Immature Granulocytes: 0.03 10*3/uL (ref 0.00–0.07)
Basophils Absolute: 0 10*3/uL (ref 0.0–0.1)
Basophils Relative: 1 %
Eosinophils Absolute: 0.3 10*3/uL (ref 0.0–0.5)
Eosinophils Relative: 4 %
Immature Granulocytes: 0 %
Lymphocytes Relative: 22 %
Lymphs Abs: 1.8 10*3/uL (ref 0.7–4.0)
Monocytes Absolute: 0.8 10*3/uL (ref 0.1–1.0)
Monocytes Relative: 10 %
Neutro Abs: 5.3 10*3/uL (ref 1.7–7.7)
Neutrophils Relative %: 63 %

## 2020-03-02 LAB — APTT: aPTT: 25 seconds (ref 24–36)

## 2020-03-02 LAB — I-STAT CHEM 8, ED
BUN: 54 mg/dL — ABNORMAL HIGH (ref 8–23)
Calcium, Ion: 1.08 mmol/L — ABNORMAL LOW (ref 1.15–1.40)
Chloride: 105 mmol/L (ref 98–111)
Creatinine, Ser: 1.6 mg/dL — ABNORMAL HIGH (ref 0.44–1.00)
Glucose, Bld: 128 mg/dL — ABNORMAL HIGH (ref 70–99)
HCT: 39 % (ref 36.0–46.0)
Hemoglobin: 13.3 g/dL (ref 12.0–15.0)
Potassium: 4.7 mmol/L (ref 3.5–5.1)
Sodium: 140 mmol/L (ref 135–145)
TCO2: 25 mmol/L (ref 22–32)

## 2020-03-02 LAB — CBG MONITORING, ED: Glucose-Capillary: 120 mg/dL — ABNORMAL HIGH (ref 70–99)

## 2020-03-02 MED ORDER — KETOROLAC TROMETHAMINE 30 MG/ML IJ SOLN: 30.0000 mg | Freq: Once | INTRAMUSCULAR | Status: DC

## 2020-03-02 MED ORDER — IOHEXOL 350 MG/ML SOLN
75.0000 mL | Freq: Once | INTRAVENOUS | Status: AC | PRN
  Administered 2020-03-02: 75 mL via INTRAVENOUS

## 2020-03-02 MED ORDER — ACETAMINOPHEN 325 MG PO TABS
650.0000 mg | ORAL_TABLET | Freq: Once | ORAL | Status: AC
  Administered 2020-03-02: 650 mg via ORAL
  Filled 2020-03-02: qty 2

## 2020-03-02 MED ORDER — METOCLOPRAMIDE HCL 5 MG/ML IJ SOLN
10.0000 mg | INTRAMUSCULAR | Status: AC
  Administered 2020-03-02: 10 mg via INTRAVENOUS
  Filled 2020-03-02: qty 2
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
## 2020-03-02 NOTE — ED Triage Notes (Signed)
Pt came in [facility] from home code stroke. LSK at 2100. S/S: Aphasia, Bilateral Weakness and Left sided headache. Pt was watching TV, her vision changed and saw a bright light and started having a headache. Pt couldn't formulate words and was altered.  18G IV LAC, 183/91, 150CBG. 97.3Tem,p, 90HR
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
## 2020-03-03 ENCOUNTER — Encounter (HOSPITAL_COMMUNITY): Payer: Self-pay | Admitting: Internal Medicine

## 2020-03-03 ENCOUNTER — Observation Stay (HOSPITAL_COMMUNITY): Payer: Medicare Other

## 2020-03-03 ENCOUNTER — Observation Stay (HOSPITAL_BASED_OUTPATIENT_CLINIC_OR_DEPARTMENT_OTHER): Payer: Medicare Other

## 2020-03-03 ENCOUNTER — Other Ambulatory Visit: Payer: Self-pay

## 2020-03-03 DIAGNOSIS — I10 Essential (primary) hypertension: Secondary | ICD-10-CM

## 2020-03-03 DIAGNOSIS — K219 Gastro-esophageal reflux disease without esophagitis: Secondary | ICD-10-CM | POA: Diagnosis present

## 2020-03-03 DIAGNOSIS — H43392 Other vitreous opacities, left eye: Secondary | ICD-10-CM | POA: Diagnosis present

## 2020-03-03 DIAGNOSIS — E875 Hyperkalemia: Secondary | ICD-10-CM | POA: Diagnosis not present

## 2020-03-03 DIAGNOSIS — N1832 Chronic kidney disease, stage 3b: Secondary | ICD-10-CM | POA: Diagnosis not present

## 2020-03-03 DIAGNOSIS — Z8673 Personal history of transient ischemic attack (TIA), and cerebral infarction without residual deficits: Secondary | ICD-10-CM | POA: Diagnosis present

## 2020-03-03 DIAGNOSIS — G459 Transient cerebral ischemic attack, unspecified: Secondary | ICD-10-CM | POA: Diagnosis not present

## 2020-03-03 DIAGNOSIS — I6389 Other cerebral infarction: Secondary | ICD-10-CM

## 2020-03-03 LAB — COMPREHENSIVE METABOLIC PANEL
ALT: 21 U/L (ref 0–44)
AST: 33 U/L (ref 15–41)
Albumin: 3.9 g/dL (ref 3.5–5.0)
Alkaline Phosphatase: 57 U/L (ref 38–126)
Anion gap: 13 (ref 5–15)
BUN: 44 mg/dL — ABNORMAL HIGH (ref 8–23)
CO2: 24 mmol/L (ref 22–32)
Calcium: 9.5 mg/dL (ref 8.9–10.3)
Chloride: 103 mmol/L (ref 98–111)
Creatinine, Ser: 1.55 mg/dL — ABNORMAL HIGH (ref 0.44–1.00)
GFR calc Af Amer: 37 mL/min — ABNORMAL LOW (ref >60)
GFR calc non Af Amer: 32 mL/min — ABNORMAL LOW (ref >60)
Glucose, Bld: 128 mg/dL — ABNORMAL HIGH (ref 70–99)
Potassium: 5.3 mmol/L — ABNORMAL HIGH (ref 3.5–5.1)
Sodium: 140 mmol/L (ref 135–145)
Total Bilirubin: 1.4 mg/dL — ABNORMAL HIGH (ref 0.3–1.2)
Total Protein: 6.8 g/dL (ref 6.5–8.1)

## 2020-03-03 LAB — LIPID PANEL
Cholesterol: 186 mg/dL (ref 0–200)
HDL: 61 mg/dL (ref >40)
LDL Cholesterol: 117 mg/dL — ABNORMAL HIGH (ref 0–99)
Total CHOL/HDL Ratio: 3 RATIO
Triglycerides: 42 mg/dL (ref <150)
VLDL: 8 mg/dL (ref 0–40)

## 2020-03-03 LAB — RENAL FUNCTION PANEL
Albumin: 3.4 g/dL — ABNORMAL LOW (ref 3.5–5.0)
Anion gap: 9 (ref 5–15)
BUN: 40 mg/dL — ABNORMAL HIGH (ref 8–23)
CO2: 24 mmol/L (ref 22–32)
Calcium: 9 mg/dL (ref 8.9–10.3)
Chloride: 108 mmol/L (ref 98–111)
Creatinine, Ser: 1.42 mg/dL — ABNORMAL HIGH (ref 0.44–1.00)
GFR calc Af Amer: 41 mL/min — ABNORMAL LOW (ref >60)
GFR calc non Af Amer: 36 mL/min — ABNORMAL LOW (ref >60)
Glucose, Bld: 102 mg/dL — ABNORMAL HIGH (ref 70–99)
Phosphorus: 3.7 mg/dL (ref 2.5–4.6)
Potassium: 5 mmol/L (ref 3.5–5.1)
Sodium: 141 mmol/L (ref 135–145)

## 2020-03-03 LAB — SARS CORONAVIRUS 2 (TAT 6-24 HRS): SARS Coronavirus 2: NEGATIVE

## 2020-03-03 LAB — HEMOGLOBIN A1C
Hgb A1c MFr Bld: 6.4 % — ABNORMAL HIGH (ref 4.8–5.6)
Mean Plasma Glucose: 136.98 mg/dL

## 2020-03-03 MED ORDER — SODIUM CHLORIDE 0.9 % IV SOLN: INTRAVENOUS | Status: AC

## 2020-03-03 MED ORDER — ALPRAZOLAM 0.25 MG PO TABS: 0.2500 mg | ORAL_TABLET | Freq: Every evening | ORAL | Status: DC | PRN

## 2020-03-03 MED ORDER — MELATONIN 3 MG PO TABS: 3.0000 mg | ORAL_TABLET | Freq: Every evening | ORAL | Status: DC | PRN

## 2020-03-03 MED ORDER — BENAZEPRIL HCL 20 MG PO TABS
10.0000 mg | ORAL_TABLET | Freq: Every day | ORAL | Status: DC
  Administered 2020-03-03 – 2020-03-04 (×2): 10 mg via ORAL
  Filled 2020-03-03 (×2): qty 1

## 2020-03-03 MED ORDER — ACETAMINOPHEN 650 MG RE SUPP: 650.0000 mg | RECTAL | Status: DC | PRN

## 2020-03-03 MED ORDER — ATORVASTATIN CALCIUM 10 MG PO TABS
20.0000 mg | ORAL_TABLET | Freq: Every evening | ORAL | Status: DC
  Administered 2020-03-03: 20 mg via ORAL
  Filled 2020-03-03: qty 2

## 2020-03-03 MED ORDER — ENOXAPARIN SODIUM 40 MG/0.4ML ~~LOC~~ SOLN
40.0000 mg | SUBCUTANEOUS | Status: DC
  Administered 2020-03-03 – 2020-03-04 (×2): 40 mg via SUBCUTANEOUS
  Filled 2020-03-03 (×2): qty 0.4

## 2020-03-03 MED ORDER — ACETAMINOPHEN 325 MG PO TABS: 650.0000 mg | ORAL_TABLET | ORAL | Status: DC | PRN

## 2020-03-03 MED ORDER — LEVOTHYROXINE SODIUM 88 MCG PO TABS
88.0000 ug | ORAL_TABLET | Freq: Every day | ORAL | Status: DC
  Administered 2020-03-03 – 2020-03-04 (×2): 88 ug via ORAL
  Filled 2020-03-03 (×2): qty 1

## 2020-03-03 MED ORDER — ONDANSETRON HCL 4 MG/2ML IJ SOLN: 4.0000 mg | Freq: Four times a day (QID) | INTRAMUSCULAR | Status: DC | PRN

## 2020-03-03 MED ORDER — ACETAMINOPHEN 160 MG/5ML PO SOLN: 650.0000 mg | ORAL | Status: DC | PRN

## 2020-03-03 MED ORDER — PANTOPRAZOLE SODIUM 40 MG PO TBEC
40.0000 mg | DELAYED_RELEASE_TABLET | Freq: Every day | ORAL | Status: DC
  Administered 2020-03-03 – 2020-03-04 (×2): 40 mg via ORAL
  Filled 2020-03-03 (×2): qty 1

## 2020-03-03 MED ORDER — STROKE: EARLY STAGES OF RECOVERY BOOK
Freq: Once | Status: AC
  Filled 2020-03-03: qty 1

## 2020-03-03 MED ORDER — ACETAMINOPHEN 325 MG PO TABS
650.0000 mg | ORAL_TABLET | Freq: Four times a day (QID) | ORAL | Status: DC | PRN
  Administered 2020-03-03: 650 mg via ORAL
  Filled 2020-03-03: qty 2

## 2020-03-03 MED ORDER — POLYETHYLENE GLYCOL 3350 17 G PO PACK: 17.0000 g | PACK | Freq: Every day | ORAL | Status: DC | PRN

## 2020-03-03 MED ORDER — BUTALBITAL-APAP-CAFFEINE 50-325-40 MG PO TABS
1.0000 | ORAL_TABLET | Freq: Four times a day (QID) | ORAL | Status: DC | PRN
  Administered 2020-03-03: 2 via ORAL
  Filled 2020-03-03: qty 2

## 2020-03-03 MED ORDER — ASPIRIN 325 MG PO TABS
325.0000 mg | ORAL_TABLET | Freq: Every day | ORAL | Status: DC
  Administered 2020-03-03 – 2020-03-04 (×2): 325 mg via ORAL
  Filled 2020-03-03 (×3): qty 1

## 2020-03-03 MED ORDER — METOCLOPRAMIDE HCL 5 MG/ML IJ SOLN
5.0000 mg | Freq: Four times a day (QID) | INTRAMUSCULAR | Status: DC | PRN
  Administered 2020-03-03 (×2): 5 mg via INTRAVENOUS
  Filled 2020-03-03 (×2): qty 2
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
## 2020-03-03 NOTE — Progress Notes (Signed)
PT Cancellation Note  Patient Details Name: Carla Lyons MRN: [id number] DOB: 1940/12/21   Cancelled Treatment:    Reason Eval/Treat Not Completed: Patient at procedure or test/unavailable(Pt getting EEG currently) Likely will not be able to re-attempt later today.  Will f/u tomorrow.   Melvern Banker 03/03/2020, 3:24 PM  Lavonia Dana, PT   Acute Rehabilitation Services  Pager [phone] Office [phone] 03/03/2020

## 2020-03-03 NOTE — H&P (Signed)
History and Physical    Carla Lyons [id number] DOB: Mar 13, 1941 DOA: 03/02/2020  PCP: Elby Beck, FNP  Patient coming from: Home   Chief Complaint: visual changes   HPI:    79 year old female with past medical history of chronic kidney disease stage III, hyperlipidemia, hypertension, hypothyroidism, anxiety disorder, gastroesophageal reflux disease and degenerative joint disease who presents to [facility] emergency department with complaints of blurry vision and difficulty with speech.  Patient explains that approximately 9 PM the evening of 5/28 she was sitting watching TV when she suddenly experienced a rapid flash of bright yellow light in both eyes.  This is followed by immediate blurry vision and difficulty with initiating any speech.  Patient felt that she was confused during the span of time.  Patient states that at the same time she also began to experience a headache, frontal in location, 5 out of 10 in intensity and nonradiating.  Patient denies any focal weakness loss of balance or tingling with the symptoms.  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  Upon an evaluation by the staff there EMS was called and the patient was brought in to [facility] for evaluation.  Patient explains that in route to the hospital her symptoms began to rapidly resolve and she returned to baseline prior to arrival, less than 1 hour after the onset of her symptoms.  Code stroke was called upon arrival to the emergency department although with patient returning back to baseline TPA was not administered.  Case was discussed with Dr. Leonel Ramsay of neurology recommended hospitalization for TIA and possible seizure work-up.  The hospitalist group has been called to assess patient for admission to the hospital.   Review of Systems: A 10-system review of systems has been performed and all  systems are negative with the exception of what is listed in the HPI.    Past Medical History:  Diagnosis Date  . Allergy   . 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 Cataract    REMOVED  . 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 Colon polyps 06-15-07  . Diverticulosis    hx of  . Hyperlipidemia   . Hypertension   . Iron deficiency anemia due to chronic blood loss 12/06/2019  . Large hiatal hernia 12/06/2019  . Osteoporosis   . Personal history of colonic adenomas 07/19/2007  . Renal insufficiency   . Thyroid disease     Past Surgical History:  Procedure Laterality Date  . APPENDECTOMY    . CATARACT EXTRACTION W/ INTRAOCULAR LENS IMPLANT Right 01/08/2018  . CATARACT EXTRACTION W/ INTRAOCULAR LENS IMPLANT Left 02/05/2018  . CESAREAN SECTION     x 2  . COLONOSCOPY  11/17/2018  . ESOPHAGOGASTRODUODENOSCOPY ENDOSCOPY    . PARTIAL HYSTERECTOMY  age 46  . VAGINAL HYSTERECTOMY  age 69   partial     reports that she quit smoking about 18 years ago. Her smoking use included cigarettes. She has never used smokeless tobacco. She reports current alcohol use of about 1.0 standard drinks of alcohol per week. She reports that she does not use drugs.  Allergies  Allergen Reactions  . Fosamax [Alendronate Sodium]     Red and hot all over  . Niacin And Related     Rash and red all over  . Codeine Phosphate     REACTION: Nausea/vomiting    Family  History  Problem Relation Age of Onset  . Coronary artery disease Mother   . Other Mother        bypass surgery  . Dementia Mother   . Osteoarthritis Father   . Heart disease Father   . Heart attack Maternal Grandfather   . Lung disease Brother   . Hypertension Brother   . Lung disease Sister   . Lung disease Sister   . Colon cancer Neg Hx   . Esophageal cancer Neg Hx   . Rectal cancer Neg Hx   . Stomach cancer Neg Hx   . Colon polyps Neg Hx      Prior to Admission medications   Medication Sig  Start Date End Date Taking? Authorizing Provider  ALPRAZolam (XANAX) 0.25 MG tablet Take 1 tablet (0.25 mg total) by mouth at bedtime. Patient taking differently: Take 0.25 mg by mouth at bedtime as needed for sleep.  10/19/19  Yes Elby Beck, FNP  aspirin (ADULT ASPIRIN EC LOW STRENGTH) 81 MG EC tablet Take 81 mg by mouth daily.    Yes [provider]  atorvastatin (LIPITOR) 20 MG tablet TAKE 1 TABLET BY MOUTH EVERY DAY Patient taking differently: Take 20 mg by mouth every evening.  11/21/19  Yes Elby Beck, FNP  b complex vitamins tablet Take 1 tablet by mouth daily.   Yes [provider]  benazepril (LOTENSIN) 10 MG tablet TAKE 1 TABLET BY MOUTH EVERY DAY Patient taking differently: Take 10 mg by mouth every evening.  03/01/20  Yes Elby Beck, FNP  BIOTIN 5000 PO Take 10,000 mcg by mouth daily.   Yes [provider]  cholecalciferol (VITAMIN D) 1000 UNITS tablet Take 1,000 Units by mouth 2 (two) times daily.    Yes [provider]  Cyanocobalamin 1000 MCG SUBL Place 1 tablet under the tongue daily.   Yes [provider]  ferrous sulfate 325 (65 FE) MG tablet Take 325 mg by mouth at bedtime.   Yes [provider]  furosemide (LASIX) 20 MG tablet Take 20 mg by mouth as needed for edema.   Yes [provider]  hydrochlorothiazide (HYDRODIURIL) 12.5 MG tablet Take 12.5 mg by mouth daily.  09/04/15  Yes [provider]  levothyroxine (SYNTHROID) 88 MCG tablet TAKE 1 TABLET BY MOUTH EVERY DAY Patient taking differently: Take 88 mcg by mouth daily before breakfast.  12/23/19  Yes Elby Beck, FNP  Melatonin 3 MG TABS Take 3 mg by mouth at bedtime as needed (for sleep).    Yes [provider]  Omega-3 Fatty Acids (FISH OIL) 1000 MG CAPS Take 1,000 mg by mouth in the morning and at bedtime.    Yes [provider]  omeprazole (PRILOSEC) 40 MG capsule Take 1 capsule (40 mg total) by mouth  daily before breakfast. 12/06/19  Yes Gatha Mayer, MD  benzonatate (TESSALON) 200 MG capsule Take 1 capsule (200 mg total) by mouth 3 (three) times daily as needed for cough. Patient not taking: Reported on 03/02/2020 12/23/19   Pleas Koch, NP  valACYclovir (VALTREX) 1000 MG tablet Take 1 tablet (1,000 mg total) by mouth 2 (two) times daily. For two doses as needed for cold sores. Patient not taking: Reported on 03/02/2020 10/19/19   Elby Beck, FNP    Physical Exam: Vitals:   03/03/20 0112 03/03/20 0115 03/03/20 0145 03/03/20 0200  BP:  (!) 151/72 (!) 161/70 (!) 157/74  Pulse: (!) 57 62 63 Marland Kitchen)  59  Resp: 18 15 16 18   Temp:      SpO2: 96% 97% 98% 95%  Weight:      Height:        Constitutional: Acute alert and oriented x3, no associated distress.   Skin: no rashes, no lesions, good skin turgor noted. Eyes: Pupils are equally reactive to light.  No evidence of scleral icterus or conjunctival pallor.  ENMT: Moist mucous membranes noted.  Posterior pharynx clear of any exudate or lesions.   Neck: normal, supple, no masses, no thyromegaly.  No evidence of jugular venous distension.   Respiratory: clear to auscultation bilaterally, no wheezing, no crackles. Normal respiratory effort. No accessory muscle use.  Cardiovascular: Regular rate and rhythm, no murmurs / rubs / gallops. No extremity edema. 2+ pedal pulses. No carotid bruits.  Chest:   Nontender without crepitus or deformity.   Back:   Nontender without crepitus or deformity. Abdomen: Abdomen is soft and nontender.  No evidence of intra-abdominal masses.  Positive bowel sounds noted in all quadrants.   Musculoskeletal: No joint deformity upper and lower extremities. Good ROM, no contractures. Normal muscle tone.  Neurologic: CN 2-12 grossly intact. Sensation intact, strength noted to be 5 out of 5 in all 4 extremities.  Patient is following all commands.  Patient is responsive to verbal stimuli.   Psychiatric: Patient  presents as a normal mood with appropriate affect.  Patient seems to possess insight as to theircurrent situation.     Labs on Admission: I have personally reviewed following labs and imaging studies -   CBC: Recent Labs  Lab 03/02/20 2238 03/02/20 2241  WBC  --  8.4  NEUTROABS  --  5.3  HGB 13.3 12.9  HCT 39.0 40.1  MCV  --  101.3*  PLT  --  [id number]   Basic Metabolic Panel: Recent Labs  Lab 03/02/20 2238 03/02/20 2241  NA 140 140  K 4.7 5.3*  CL 105 103  CO2  --  24  GLUCOSE 128* 128*  BUN 54* 44*  CREATININE 1.60* 1.55*  CALCIUM  --  9.5   GFR: Estimated Creatinine Clearance: 29.4 mL/min (A) (by C-G formula based on SCr of 1.55 mg/dL (H)). Liver Function Tests: Recent Labs  Lab 03/02/20 2241  AST 33  ALT 21  ALKPHOS 57  BILITOT 1.4*  PROT 6.8  ALBUMIN 3.9   No results for input(s): LIPASE, AMYLASE in the last 168 hours. No results for input(s): AMMONIA in the last 168 hours. Coagulation Profile: Recent Labs  Lab 03/02/20 2241  INR 1.0   Cardiac Enzymes: No results for input(s): CKTOTAL, CKMB, CKMBINDEX, TROPONINI in the last 168 hours. BNP (last 3 results) No results for input(s): PROBNP in the last 8760 hours. HbA1C: No results for input(s): HGBA1C in the last 72 hours. CBG: Recent Labs  Lab 03/02/20 2232  GLUCAP 120*   Lipid Profile: No results for input(s): CHOL, HDL, LDLCALC, TRIG, CHOLHDL, LDLDIRECT in the last 72 hours. Thyroid Function Tests: No results for input(s): TSH, T4TOTAL, FREET4, T3FREE, THYROIDAB in the last 72 hours. Anemia Panel: No results for input(s): VITAMINB12, FOLATE, FERRITIN, TIBC, IRON, RETICCTPCT in the last 72 hours. Urine analysis:    Component Value Date/Time   COLORURINE AMBER (A) 04/01/2019 1220   APPEARANCEUR CLOUDY (A) 04/01/2019 1220   LABSPEC 1.010 04/01/2019 1220   LABSPEC 1.030 02/15/2013 1202   PHURINE 5.0 04/01/2019 1220   [place] 04/01/2019 [place] 10/21/2018 0805  GLUCOSEU Negative 02/15/2013 1202   HGBUR NEGATIVE 04/01/2019 1220   [place] 04/01/2019 1220   BILIRUBINUR neg 10/15/2018 0917   BILIRUBINUR Negative 02/15/2013 [place] 04/01/2019 1220   PROTEINUR NEGATIVE 04/01/2019 1220   UROBILINOGEN 0.2 10/21/2018 0805   UROBILINOGEN 0.2 02/15/2013 1202   NITRITE NEGATIVE 04/01/2019 1220   LEUKOCYTESUR NEGATIVE 04/01/2019 1220   LEUKOCYTESUR Negative 02/15/2013 1202    Radiological Exams on Admission - Personally Reviewed: CT Code Stroke CTA Head W/WO contrast  Result Date: 03/02/2020 CLINICAL DATA:  Acute neuro deficit.  Aphasia.  Headache. EXAM: CT ANGIOGRAPHY HEAD AND NECK TECHNIQUE: Multidetector CT imaging of the head and neck was performed using the standard protocol during bolus administration of intravenous contrast. Multiplanar CT image reconstructions and MIPs were obtained to evaluate the vascular anatomy. Carotid stenosis measurements (when applicable) are obtained utilizing NASCET criteria, using the distal internal carotid diameter as the denominator. CONTRAST:  56mL OMNIPAQUE IOHEXOL 350 MG/ML SOLN COMPARISON:  CT head earlier today FINDINGS: CTA NECK FINDINGS Aortic arch: Atherosclerotic aortic arch. Three-vessel arch. Proximal great vessels patent without stenosis. Right carotid system: Mild atherosclerotic disease right carotid bifurcation without significant stenosis. Retropharyngeal course of right internal carotid artery. Left carotid system: Mild atherosclerotic disease left common carotid artery and left carotid bifurcation. Less than 25% diameter stenosis proximal left internal carotid artery. Retropharyngeal course of left internal carotid artery. Kissing carotids. Vertebral arteries: Both vertebral arteries patent to the basilar without significant stenosis. Mild atherosclerotic disease in the vertebral arteries bilaterally. Skeleton: Cervical spine degenerative change. No acute skeletal abnormality. Other  neck: Negative for mass or adenopathy. 8 x 10 mm right thyroid nodule. No further imaging is necessary. Upper chest: Mild apical emphysema. No acute abnormality. Atherosclerotic aortic arch. Review of the MIP images confirms the above findings CTA HEAD FINDINGS Anterior circulation: Mild atherosclerotic disease in the cavernous carotid bilaterally without stenosis. Anterior and middle cerebral arteries patent bilaterally without stenosis or large vessel occlusion. 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 Posterior circulation: Both vertebral arteries patent to the basilar. PICA patent bilaterally. Basilar widely patent. Superior cerebellar and posterior cerebral arteries patent bilaterally without stenosis. Venous sinuses: Normal venous enhancement Anatomic variants: None Review of the MIP images confirms the above findings IMPRESSION: 1. No significant carotid or vertebral artery stenosis in the neck. Mild diffuse atherosclerotic disease. 2. Negative for intracranial large vessel occlusion or flow limiting stenosis. 3. Preliminary results texted to Dr. Leonel Ramsay Electronically Signed   By: Franchot Gallo M.D.   On: 03/02/2020 23:00   CT Code Stroke CTA Neck W/WO contrast  Result Date: 03/02/2020 CLINICAL DATA:  Acute neuro deficit.  Aphasia.  Headache. EXAM: CT ANGIOGRAPHY HEAD AND NECK TECHNIQUE: Multidetector CT imaging of the head and neck was performed using the standard protocol during bolus administration of intravenous contrast. Multiplanar CT image reconstructions and MIPs were obtained to evaluate the vascular anatomy. Carotid stenosis measurements (when applicable) are obtained utilizing NASCET criteria, using the distal internal carotid diameter as the denominator. CONTRAST:  76mL OMNIPAQUE IOHEXOL 350 MG/ML SOLN COMPARISON:  CT head earlier today FINDINGS: CTA NECK FINDINGS Aortic arch: Atherosclerotic aortic arch. Three-vessel arch. Proximal great vessels patent  without stenosis. Right carotid system: Mild atherosclerotic disease right carotid bifurcation without significant stenosis. Retropharyngeal course of right internal carotid artery. Left carotid system: Mild atherosclerotic disease left common carotid artery and left carotid bifurcation. Less than 25% diameter stenosis proximal left internal carotid artery. Retropharyngeal course  of left internal carotid artery. Kissing carotids. Vertebral arteries: Both vertebral arteries patent to the basilar without significant stenosis. Mild atherosclerotic disease in the vertebral arteries bilaterally. Skeleton: Cervical spine degenerative change. No acute skeletal abnormality. Other neck: Negative for mass or adenopathy. 8 x 10 mm right thyroid nodule. No further imaging is necessary. Upper chest: Mild apical emphysema. No acute abnormality. Atherosclerotic aortic arch. Review of the MIP images confirms the above findings CTA HEAD FINDINGS Anterior circulation: Mild atherosclerotic disease in the cavernous carotid bilaterally without stenosis. Anterior and middle cerebral arteries patent bilaterally without stenosis or large vessel occlusion. 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 Posterior circulation: Both vertebral arteries patent to the basilar. PICA patent bilaterally. Basilar widely patent. Superior cerebellar and posterior cerebral arteries patent bilaterally without stenosis. Venous sinuses: Normal venous enhancement Anatomic variants: None Review of the MIP images confirms the above findings IMPRESSION: 1. No significant carotid or vertebral artery stenosis in the neck. Mild diffuse atherosclerotic disease. 2. Negative for intracranial large vessel occlusion or flow limiting stenosis. 3. Preliminary results texted to Dr. Leonel Ramsay Electronically Signed   By: Franchot Gallo M.D.   On: 03/02/2020 23:00   CT HEAD CODE STROKE WO CONTRAST  Result Date: 03/02/2020 CLINICAL DATA:   Code stroke. Acute neuro deficit. Aphasia. Left-sided headache. EXAM: CT HEAD WITHOUT CONTRAST TECHNIQUE: Contiguous axial images were obtained from the base of the skull through the vertex without intravenous contrast. COMPARISON:  CT head 04/26/2016 FINDINGS: Brain: Generalized atrophy. Patchy white matter hypodensity bilaterally compatible with chronic microvascular ischemic change. Negative for acute infarct, hemorrhage, mass. Vascular: Negative for hyperdense vessel Skull: Negative Sinuses/Orbits: Mild mucosal edema paranasal sinuses. Bilateral cataract extraction. No orbital mass lesion. Other: None ASPECTS ([place] Stroke Program Early CT Score) - Ganglionic level infarction (caudate, lentiform nuclei, internal capsule, insula, M1-M3 cortex): 7 - Supraganglionic infarction (M4-M6 cortex): 3 Total score (0-10 with 10 being normal): 10 IMPRESSION: 1. No acute abnormality 2. ASPECTS is 10 3. Mild atrophy and chronic microvascular ischemic change in the white matter. 4. Preliminary results texted to Dr. Leonel Ramsay via Shea Evans Electronically Signed   By: Franchot Gallo M.D.   On: 03/02/2020 22:48    EKG: Personally reviewed.  Rhythm is normal sinus rhythm with heart rate of 76 bpm.  No dynamic ST segment changes appreciated.  Assessment/Plan Active Problems:   TIA (transient ischemic attack)   Patient exhibited less than 1 hour of sudden visual changes, difficulty with speech and confusion.    Patient's presentation is extremely atypical.  Symptoms are most likely secondary to a TIA but due to the flashes of light that the patient describes as well as concurrent headache they are also possibly secondary to either a complicated migraine or atypical seizure.  Placing patient in observation with neurologic checks  Monitoring patient on telemetry  Obtaining MRI of the brain without contrast  CT angiogram of the head neck is already been performed  PT evaluation in the morning  Considering  possibility of seizure activity, EEG has been ordered  Patient is already been evaluated by Dr. Saralyn Pilar and neurology will continue to follow.  Recommendations appreciated.  325 mg aspirin daily ordered per neurology recommendations  As needed Reglan for bouts of headache per neurology recommendations  Continuing home regimen of daily statin, will titrate dose upwards based on lipid panel results.  Hyperkalemia  Very mild hyperkalemia noted  No evidence of EKG changes  Hydrating patient gently with intravenous isotonic fluids  Anticipate hyperkalemia will resolve with hydration  Monitoring patient on telemetry    Hypothyroidism   Continue home regimen of Synthroid    HYPERLIPIDEMIA, MIXED   Continue home regimen of statin therapy, will titrate dosing upwards based on lipid panel results    Essential hypertension  Continue home regimen of antihypertensive therapy    CKD (chronic kidney disease), stage III   Creatinine near baseline  Input and output monitoring  Minimizing nephrotoxic agents  Monitoring renal function and electrolytes with serial chemistries    GERD without esophagitis  Continue home regimen of PPI  Floaters   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  Dispo: The patient is from: Home              Anticipated d/c is to: Home              Anticipated d/c date is: 2 days              Patient currently is not medically stable to d/c.        Vernelle Emerald MD [facility] Pager [phone]  If 7PM-7AM, please contact night-coverage [email or web address] Use universal [place] password for that web site. If you do not have the password, please call the hospital  operator.  03/03/2020, 2:27 AM

## 2020-03-03 NOTE — Progress Notes (Signed)
Neurology Progress Note: Reason for Consult: Aphasia Referring Physician:  Roxine Caddy  CC: mild h/a  Interval Hx: No further spells of confusion. MRI neg for stroke. EEG pending. Pt feels back to baseline this am except for dull h/a Exam:  Current vital signs: BP 135/70 (BP Location: Right Arm)   Pulse (!) 55   Temp 98.1 F (36.7 C) (Oral)   Resp 18   Ht 5\' 3"  (1.6 m)   Wt 74.7 kg   SpO2 98%   BMI 29.18 kg/m  Vital signs in last 24 hours: Temp:  [98 F (36.7 C)-98.2 F (36.8 C)] 98.1 F (36.7 C) (05/29 0735) Pulse Rate:  [55-80] 55 (05/29 0735) Resp:  [13-20] 18 (05/29 0735) BP: (135-187)/(64-82) 135/70 (05/29 0735) SpO2:  [95 %-100 %] 98 % (05/29 0735) Weight:  [74.7 kg] 74.7 kg (05/28 2242)   Physical Exam  Constitutional: Appears well-developed and well-nourished.  Psych: Affect appropriate to situation Eyes: No scleral injection HENT: No OP obstrucion MSK: no joint deformities.  Cardiovascular: Normal rate and regular rhythm.  Respiratory: Effort normal, non-labored breathing GI: Soft.  No distension. There is no tenderness.  Skin: WDI  Neuro: Mental Status: Patient is awake, alert, oriented to person, place, month, year, and situation. Patient is able to give a clear and coherent history. No signs of aphasia or neglect Cranial Nerves: II: Visual Fields are full. Pupils are equal, round, and reactive to light.   III,IV, VI: EOMI without ptosis or diploplia.  V: Facial sensation is symmetric to temperature VII: Facial movement is symmetric.  VIII: hearing is intact to voice X: Uvula elevates symmetrically XI: Shoulder shrug is symmetric. XII: tongue is midline without atrophy or fasciculations.  Motor: Tone is normal. Bulk is normal. 5/5 strength was present in all four extremities.  Sensory: Sensation is symmetric to light touch and temperature in the arms and legs. Cerebellar: FNF and HKS are intact bilaterally   I have reviewed labs in epic and the  results pertinent to this consultation are: Creatinine 1.6  I have reviewed the images and tests obtained: CT head-unremarkable. MRI neg for acute finding  A/P: 79 year old female with acute confusional episode followed by headache.  She does not have a history of migraines, and new onset migraines at age 33 would be unusual.  Possibilities include TIA, though the bright light described would be suggestive of migraine aura or seizure as another possibility. EEG pending.   Recommendations: EEG pending No ASD at this time Reglan and Excedrin for headache  Arlo Buffone Metzger-Cihelka, ARNP-C, ANVP-BC Pager: [phone] If 7pm- 7am, please page neurology on call as listed in [place].

## 2020-03-03 NOTE — Plan of Care (Signed)
  Problem: Coping: Goal: Will verbalize positive feelings about self Outcome: Progressing   Problem: Self-Care: Goal: Ability to participate in self-care as condition permits will improve Outcome: Progressing Goal: Ability to communicate needs accurately will improve Outcome: Progressing   Problem: Nutrition: Goal: Risk of aspiration will decrease Outcome: Progressing

## 2020-03-03 NOTE — Procedures (Signed)
ELECTROENCEPHALOGRAM REPORT   Patient: Carla Lyons       Room #: [id number] EEG No. ID: [id number] Age: 79 y.o.        Sex: female Requesting Physician: Ree Kida Report Date:  03/03/2020        Interpreting Physician: Alexis Goodell  History: Carla Lyons is an 79 y.o. female with episode of slurred speech  Medications:  ASA, Synthroid, Lotensin, Lipitor, Protonix  Conditions of Recording:  This is a 21 channel routine scalp EEG performed with bipolar and monopolar montages arranged in accordance to the international 10/20 system of electrode placement. One channel was dedicated to EKG recording.  The patient is in the awake, drowsy and asleep states.  Description:  The waking background activity consists of a low voltage, symmetrical, fairly well organized, 8-9 Hz alpha activity, seen from the parieto-occipital and posterior temporal regions.  Low voltage fast activity, poorly organized, is seen anteriorly and is at times superimposed on more posterior regions.  A mixture of theta and alpha rhythms are seen from the central and temporal regions. The patient drowses with slowing to irregular, low voltage theta and beta activity.   The patient goes in to a light sleep with symmetrical sleep spindles, vertex central sharp transients and irregular slow activity.   No epileptiform activity is noted.   Hyperventilation and intermittent photic stimulation were not performed.  IMPRESSION: Normal electroencephalogram, awake and asleep. There are no focal lateralizing or epileptiform features.   Alexis Goodell, MD Neurology [phone] 03/03/2020, 5:18 PM

## 2020-03-03 NOTE — Progress Notes (Signed)
  Echocardiogram 2D Echocardiogram has been performed.  Carla Lyons 03/03/2020, 2:43 PM

## 2020-03-03 NOTE — Progress Notes (Signed)
EEG complete - results pending 

## 2020-03-04 DIAGNOSIS — I10 Essential (primary) hypertension: Secondary | ICD-10-CM | POA: Diagnosis not present

## 2020-03-04 DIAGNOSIS — N1832 Chronic kidney disease, stage 3b: Secondary | ICD-10-CM | POA: Diagnosis not present

## 2020-03-04 DIAGNOSIS — E875 Hyperkalemia: Secondary | ICD-10-CM

## 2020-03-04 DIAGNOSIS — E782 Mixed hyperlipidemia: Secondary | ICD-10-CM

## 2020-03-04 DIAGNOSIS — G459 Transient cerebral ischemic attack, unspecified: Secondary | ICD-10-CM | POA: Diagnosis not present

## 2020-03-04 DIAGNOSIS — E039 Hypothyroidism, unspecified: Secondary | ICD-10-CM

## 2020-03-04 DIAGNOSIS — K219 Gastro-esophageal reflux disease without esophagitis: Secondary | ICD-10-CM

## 2020-03-04 DIAGNOSIS — H43392 Other vitreous opacities, left eye: Secondary | ICD-10-CM

## 2020-03-04 DIAGNOSIS — R29818 Other symptoms and signs involving the nervous system: Secondary | ICD-10-CM | POA: Diagnosis not present

## 2020-03-04 LAB — BASIC METABOLIC PANEL
Anion gap: 5 (ref 5–15)
BUN: 29 mg/dL — ABNORMAL HIGH (ref 8–23)
CO2: 26 mmol/L (ref 22–32)
Calcium: 9.2 mg/dL (ref 8.9–10.3)
Chloride: 112 mmol/L — ABNORMAL HIGH (ref 98–111)
Creatinine, Ser: 1.55 mg/dL — ABNORMAL HIGH (ref 0.44–1.00)
GFR calc Af Amer: 37 mL/min — ABNORMAL LOW (ref >60)
GFR calc non Af Amer: 32 mL/min — ABNORMAL LOW (ref >60)
Glucose, Bld: 92 mg/dL (ref 70–99)
Potassium: 5.3 mmol/L — ABNORMAL HIGH (ref 3.5–5.1)
Sodium: 143 mmol/L (ref 135–145)

## 2020-03-04 MED ORDER — BUTALBITAL-APAP-CAFFEINE 50-325-40 MG PO TABS: 1.0000 | ORAL_TABLET | Freq: Four times a day (QID) | ORAL | 0 refills | Status: DC | PRN

## 2020-03-04 NOTE — Evaluation (Signed)
Physical Therapy Evaluation and Discharge Patient Details Name: Carla Lyons MRN: [id number] DOB: [birth year] Today's Date: 03/04/2020   History of Present Illness  79 year old female with past medical history of chronic kidney disease stage III, hyperlipidemia, hypertension, hypothyroidism, anxiety disorder, gastroesophageal reflux disease and degenerative joint disease who presents to [facility] emergency department 03/03/20 with complaints of blurry vision and difficulty with speech. Symptoms lasted ~1 hour and then rapidly resolved. Admitted for CVA vs seizure vs TIA. MRI brain negative  Clinical Impression   Patient evaluated by Physical Therapy with no further acute PT needs identified. Pt scored 49/56 on Berg Balance Assesment and 23/24 on Dynamic Gait index. She is normally physically active including exercising on home elliptical, yard work, shopping, etc.  PT is signing off. Thank you for this referral.     Follow Up Recommendations No PT follow up    Equipment Recommendations  None recommended by PT    Recommendations for Other Services       Precautions / Restrictions Precautions Precautions: None      Mobility  Bed Mobility Overal bed mobility: Independent                Transfers Overall transfer level: Independent                  Ambulation/Gait Ambulation/Gait assistance: Independent Gait Distance (Feet): 200 Feet Assistive device: None Gait Pattern/deviations: WFL(Within Functional Limits)   Gait velocity interpretation: 1.31 - 2.62 ft/sec, indicative of limited community ambulator General Gait Details: able to vary speeds, perform head turns, sudden stops, turns without imbalance  Stairs            Wheelchair Mobility    Modified Rankin (Stroke Patients Only) Modified Rankin (Stroke Patients Only) Pre-Morbid Rankin Score: No symptoms Modified Rankin: No symptoms     Balance Overall balance assessment: Independent                                Standardized Balance Assessment Standardized Balance Assessment : Berg Balance Test;Dynamic Gait Index Berg Balance Test Sit to Stand: Able to stand without using hands and stabilize independently Standing Unsupported: Able to stand safely 2 minutes Sitting with Back Unsupported but Feet Supported on Floor or Stool: Able to sit safely and securely 2 minutes Stand to Sit: Sits safely with minimal use of hands Transfers: Able to transfer safely, minor use of hands Standing Unsupported with Eyes Closed: Able to stand 10 seconds safely Standing Ubsupported with Feet Together: Able to place feet together independently and stand for 1 minute with supervision From Standing, Reach Forward with Outstretched Arm: Can reach confidently >25 cm (10") From Standing Position, Pick up Object from Floor: Able to pick up shoe safely and easily From Standing Position, Turn to Look Behind Over each Shoulder: Looks behind from both sides and weight shifts well Turn 360 Degrees: Able to turn 360 degrees safely but slowly Standing Unsupported, Alternately Place Feet on Step/Stool: Able to stand independently and complete 8 steps >20 seconds Standing Unsupported, One Foot in Front: Able to plae foot ahead of the other independently and hold 30 seconds Standing on One Leg: Able to lift leg independently and hold equal to or more than 3 seconds Total Score: 49 Dynamic Gait Index Level Surface: Normal Change in Gait Speed: Normal Gait with Horizontal Head Turns: Normal Gait with Vertical Head Turns: Normal Gait and Pivot Turn: Normal Step Over Obstacle:  Normal Step Around Obstacles: Normal Steps: Mild Impairment Total Score: 23       Pertinent Vitals/Pain Pain Assessment: No/denies pain    Home Living Family/patient expects to be discharged to:: Private residence Living Arrangements: Alone Available Help at Discharge: Family;Available PRN/intermittently Type of  Home: House Home Access: Stairs to enter Entrance Stairs-Rails: None Entrance Stairs-Number of Steps: 1 Home Layout: One level Home Equipment: Shower seat;Grab bars - tub/shower;Bedside commode;Walker - 2 wheels(RW and BSC belonged to mother)      Prior Function Level of Independence: Independent         Comments: driving, grocery shopping; rides elliptical bike; does yard work     Hand Dominance   Dominant Hand: Left    Extremity/Trunk Assessment   Upper Extremity Assessment Upper Extremity Assessment: Overall WFL for tasks assessed(full ROM left shoulder despite reporting "frozen shoulder")    Lower Extremity Assessment Lower Extremity Assessment: Overall WFL for tasks assessed    Cervical / Trunk Assessment Cervical / Trunk Assessment: Normal  Communication   Communication: No difficulties  Cognition Arousal/Alertness: Awake/alert Behavior During Therapy: WFL for tasks assessed/performed Overall Cognitive Status: Within Functional Limits for tasks assessed                                 General Comments: a&o x 4      General Comments      Exercises     Assessment/Plan    PT Assessment Patent does not need any further PT services  PT Problem List         PT Treatment Interventions      PT Goals (Current goals can be found in the Care Plan section)  Acute Rehab PT Goals Patient Stated Goal: return to active lifestyle PT Goal Formulation: All assessment and education complete, DC therapy    Frequency     Barriers to discharge        Co-evaluation               AM-PAC PT "6 Clicks" Mobility  Outcome Measure Help needed turning from your back to your side while in a flat bed without using bedrails?: None Help needed moving from lying on your back to sitting on the side of a flat bed without using bedrails?: None Help needed moving to and from a bed to a chair (including a wheelchair)?: None Help needed standing up from a  chair using your arms (e.g., wheelchair or bedside chair)?: None Help needed to walk in hospital room?: None Help needed climbing 3-5 steps with a railing? : None 6 Click Score: 24    End of Session Equipment Utilized During Treatment: Gait belt Activity Tolerance: Patient tolerated treatment well Patient left: in chair;with call bell/phone within reach;with chair alarm set Nurse Communication: Mobility status;Other (comment)(no PT/DME needs) PT Visit Diagnosis: Other symptoms and signs involving the nervous system [id number])    Time: [id number] PT Time Calculation (min) (ACUTE ONLY): 22 min   Charges:   PT Evaluation $PT Eval Low Complexity: 1 Low           Arby Barrette, PT Pager [phone]   Rexanne Mano 03/04/2020, 11:06 AM

## 2020-03-04 NOTE — Discharge Summary (Signed)
Physician Discharge Summary  Carla Lyons [id number] DOB: 29-Jun-1941 DOA: 03/02/2020  PCP: Elby Beck, FNP  Admit date: 03/02/2020 Discharge date: 03/04/2020  Time spent: 45 minutes  Recommendations for Outpatient Follow-up:  Patient will be discharged to home.  Patient will need to follow up with primary care provider within one week of discharge.  Follow up with neurology. Patient should continue medications as prescribed.  Patient should follow a heart healthy diet.   Discharge Diagnoses:  TIA versus complicated migraine Hyperkalemia Hypothyroidism Hyperlipidemia Essential hypertension Chronic kidney disease, stage IIIb GERD  Floaters  Discharge Condition: stable  Diet recommendation: heart healthy  Filed Weights   03/02/20 2200 03/02/20 2242  Weight: 74.7 kg 74.7 kg    History of present illness:  On 02/29/2020 by Dr. Inda Merlin 79 year old female with past medical history of chronic kidney disease stage III, hyperlipidemia, hypertension, hypothyroidism, anxiety disorder, gastroesophageal reflux disease and degenerative joint disease who presents to [facility] emergency department with complaints of blurry vision and difficulty with speech.  Patient explains that approximately 9 PM the evening of 5/28 she was sitting watching TV when she suddenly experienced a rapid flash of bright yellow light in both eyes. This is followed by immediate blurry vision and difficulty with initiating any speech. Patient felt that she was confused during the span of time. Patient states that at the same time she also began to experience a headache, frontal in location, 5 out of 10 in intensity and nonradiating. Patient denies any focal weakness loss of balance or tingling with the symptoms.  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 Upon an evaluation by the staff there EMS  was called and the patient was brought in to [facility] for evaluation.  Patient explains that in route to the hospital her symptoms began to rapidly resolve and she returned to baseline prior to arrival, less than 1 hour after the onset of her symptoms.  Code stroke was called upon arrival to the emergency department although with patient returning back to baseline TPA was not administered. Case was discussed with Dr. Leonel Ramsay of neurology recommended hospitalization for TIA and possible seizure work-up. The hospitalist group has been called to assess patient for admission to the hospital.  Hospital Course:  TIA versus complicated migraine -Patient presented with sudden visual changes for 1 hour along with speech difficulty and confusion -Currently her speech appears to be normal however she continues to complain of blurry vision and headache -CT head showed no acute normality, mild atrophy and chronic microvascular ischemic change -MRI brain no acute intercranial normality -CTA head and neck showed no significant carotid or vertebral artery stenosis in the neck.  Negative for intracranial large vessel occlusion or flow-limiting stenosis -LDL 117, hemoglobin A1c 6.4 -EEG was normal, no focal lateralizing or left form features -With bubble study showed EF of 60 to 65%, moderate LVH, grade 1 diastolic dysfunction.  Trivial mitral valve regurgitation.  Bubble study unremarkable, no evidence of any interartrial shunt. -Neurology consulted and appreciated, recommended neurology follow-up in 4 to 6 weeks.  Feels this may be TIA.  Continue over-the-counter medications for headaches.  No driving until outpatient neurology follow-up.  No AED at this time.  Hyperkalemia -Mild, 5.3 on admission, now down to 5 -No evidence of EKG changes -Continue to monitor BMP  Hypothyroidism -Continue Synthroid  Hyperlipidemia -Continue statin  Essential hypertension -Continue  Lotensin  Chronic kidney disease, stage IIIb -Creatinine appears to be at baseline -Continue to monitor BMP  GERD  -Continue PPI  Floaters -Has had for quite some time and noted in patient's left field of vision -Patient to follow-up with optometry or ophthalmology as an outpatient  Procedures: EEG Echocardiogram  Consultations: Neurology  Discharge Exam: Vitals:   03/04/20 0345 03/04/20 0824  BP: (!) 157/67 139/64  Pulse: (!) 59 61  Resp: 18 16  Temp: 98.2 F (36.8 C) 98.1 F (36.7 C)  SpO2: 95% 98%    Exam  General: Well developed, well nourished, NAD, appears stated age  HEENT: NCAT,  mucous membranes moist.   Cardiovascular: S1 S2 auscultated, RRR  Respiratory: Clear to auscultation bilaterally with equal chest rise  Abdomen: Soft, nontender, nondistended, + bowel sounds  Extremities: warm dry without cyanosis clubbing or edema  Neuro: AAOx3, nonfocal  Psych: Normal affect and demeanor  Discharge Instructions Discharge Instructions    Discharge instructions   Complete by: As directed    Patient will be discharged to home.  Patient will need to follow up with primary care provider within one week of discharge.  Follow up with neurology. Patient should continue medications as prescribed.  Patient should follow a heart healthy diet.   Driving Restrictions   Complete by: As directed    Do not drive until seen by neurology as an outpatient.     Allergies as of 03/04/2020      Reactions   Fosamax [alendronate Sodium]    Red and hot all over   Niacin And Related    Rash and red all over   Codeine Phosphate    REACTION: Nausea/vomiting      Medication List    STOP taking these medications   benzonatate 200 MG capsule Commonly known as: TESSALON   valACYclovir 1000 MG tablet Commonly known as: VALTREX     TAKE these medications   Adult Aspirin EC Low Strength 81 MG EC tablet Generic drug: aspirin Take 81 mg by mouth daily.   ALPRAZolam  0.25 MG tablet Commonly known as: XANAX Take 1 tablet (0.25 mg total) by mouth at bedtime. What changed:   when to take this  reasons to take this   atorvastatin 20 MG tablet Commonly known as: LIPITOR TAKE 1 TABLET BY MOUTH EVERY DAY What changed: when to take this   b complex vitamins tablet Take 1 tablet by mouth daily.   benazepril 10 MG tablet Commonly known as: LOTENSIN TAKE 1 TABLET BY MOUTH EVERY DAY What changed: when to take this   BIOTIN 5000 PO Take 10,000 mcg by mouth daily.   butalbital-acetaminophen-caffeine 50-325-40 MG tablet Commonly known as: FIORICET Take 1-2 tablets by mouth every 6 (six) hours as needed for headache. Do not take more than 2 times per week given possibility of rebound headaches.   cholecalciferol 1000 units tablet Commonly known as: VITAMIN D Take 1,000 Units by mouth 2 (two) times daily.   Cyanocobalamin 1000 MCG Subl Place 1 tablet under the tongue daily.   ferrous sulfate 325 (65 FE) MG tablet Take 325 mg by mouth at bedtime.   Fish Oil 1000 MG Caps Take 1,000 mg by mouth in the morning and at bedtime.   furosemide 20 MG tablet Commonly known as: LASIX Take 20 mg by mouth as needed for edema.   hydrochlorothiazide 12.5 MG tablet Commonly known as: HYDRODIURIL Take 12.5 mg by mouth daily.   levothyroxine 88 MCG tablet Commonly known as:  SYNTHROID TAKE 1 TABLET BY MOUTH EVERY DAY What changed: when to take this   melatonin 3 MG Tabs tablet Take 3 mg by mouth at bedtime as needed (for sleep).   omeprazole 40 MG capsule Commonly known as: PRILOSEC Take 1 capsule (40 mg total) by mouth daily before breakfast.      Allergies  Allergen Reactions  . Fosamax [Alendronate Sodium]     Red and hot all over  . Niacin And Related     Rash and red all over  . Codeine Phosphate     REACTION: Nausea/vomiting   Follow-up Information    Elby Beck, FNP. Schedule an appointment as soon as possible for a visit in 1  week(s).   Specialties: Nurse Practitioner, Family Medicine Why: Hospital follow up Contact information: [place] [address] [phone]        Melvenia Beam, MD. Schedule an appointment as soon as possible for a visit in 2 week(s).   Specialty: Neurology Why: Hospital follow up, headaches Contact information: [place] [address] [phone]            The results of significant diagnostics from this hospitalization (including imaging, microbiology, ancillary and laboratory) are listed below for reference.    Significant Diagnostic Studies: EEG  Result Date: 03/03/2020 Alexis Goodell, MD     03/03/2020  5:22 PM ELECTROENCEPHALOGRAM REPORT Patient: Yamilee Barsoum       Room #: [id number] EEG No. ID: [id number] Age: 79 y.o.        Sex: female Requesting Physician: Ree Kida Report Date:  03/03/2020       Interpreting Physician: Alexis Goodell History: Aalayna Wannamaker is an 79 y.o. female with episode of slurred speech Medications: ASA, Synthroid, Lotensin, Lipitor, Protonix Conditions of Recording:  This is a 21 channel routine scalp EEG performed with bipolar and monopolar montages arranged in accordance to the international 10/20 system of electrode placement. One channel was dedicated to EKG recording. The patient is in the awake, drowsy and asleep states. Description:  The waking background activity consists of a low voltage, symmetrical, fairly well organized, 8-9 Hz alpha activity, seen from the parieto-occipital and posterior temporal regions.  Low voltage fast activity, poorly organized, is seen anteriorly and is at times superimposed on more posterior regions.  A mixture of theta and alpha rhythms are seen from the central and temporal regions. The patient drowses with slowing to irregular, low voltage theta and beta activity.  The patient goes in to a light sleep with symmetrical sleep spindles, vertex central sharp transients and irregular slow  activity.  No epileptiform activity is noted.  Hyperventilation and intermittent photic stimulation were not performed. IMPRESSION: Normal electroencephalogram, awake and asleep. There are no focal lateralizing or epileptiform features. Alexis Goodell, MD Neurology [phone] 03/03/2020, 5:18 PM   CT Code Stroke CTA Head W/WO contrast  Result Date: 03/02/2020 CLINICAL DATA:  Acute neuro deficit.  Aphasia.  Headache. EXAM: CT ANGIOGRAPHY HEAD AND NECK TECHNIQUE: Multidetector CT imaging of the head and neck was performed using the standard protocol during bolus administration of intravenous contrast. Multiplanar CT image reconstructions and MIPs were obtained to evaluate the vascular anatomy. Carotid stenosis measurements (when applicable) are obtained utilizing NASCET criteria, using the distal internal carotid diameter as the denominator. CONTRAST:  62mL OMNIPAQUE IOHEXOL 350 MG/ML SOLN COMPARISON:  CT head earlier today FINDINGS: CTA NECK FINDINGS Aortic arch: Atherosclerotic aortic arch. Three-vessel arch. Proximal great vessels patent  without stenosis. Right carotid system: Mild atherosclerotic disease right carotid bifurcation without significant stenosis. Retropharyngeal course of right internal carotid artery. Left carotid system: Mild atherosclerotic disease left common carotid artery and left carotid bifurcation. Less than 25% diameter stenosis proximal left internal carotid artery. Retropharyngeal course of left internal carotid artery. Kissing carotids. Vertebral arteries: Both vertebral arteries patent to the basilar without significant stenosis. Mild atherosclerotic disease in the vertebral arteries bilaterally. Skeleton: Cervical spine degenerative change. No acute skeletal abnormality. Other neck: Negative for mass or adenopathy. 8 x 10 mm right thyroid nodule. No further imaging is necessary. Upper chest: Mild apical emphysema. No acute abnormality. Atherosclerotic aortic arch. Review of the  MIP images confirms the above findings CTA HEAD FINDINGS Anterior circulation: Mild atherosclerotic disease in the cavernous carotid bilaterally without stenosis. Anterior and middle cerebral arteries patent bilaterally without stenosis or large vessel occlusion. 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 Posterior circulation: Both vertebral arteries patent to the basilar. PICA patent bilaterally. Basilar widely patent. Superior cerebellar and posterior cerebral arteries patent bilaterally without stenosis. Venous sinuses: Normal venous enhancement Anatomic variants: None Review of the MIP images confirms the above findings IMPRESSION: 1. No significant carotid or vertebral artery stenosis in the neck. Mild diffuse atherosclerotic disease. 2. Negative for intracranial large vessel occlusion or flow limiting stenosis. 3. Preliminary results texted to Dr. Leonel Ramsay Electronically Signed   By: Franchot Gallo M.D.   On: 03/02/2020 23:00   CT Code Stroke CTA Neck W/WO contrast  Result Date: 03/02/2020 CLINICAL DATA:  Acute neuro deficit.  Aphasia.  Headache. EXAM: CT ANGIOGRAPHY HEAD AND NECK TECHNIQUE: Multidetector CT imaging of the head and neck was performed using the standard protocol during bolus administration of intravenous contrast. Multiplanar CT image reconstructions and MIPs were obtained to evaluate the vascular anatomy. Carotid stenosis measurements (when applicable) are obtained utilizing NASCET criteria, using the distal internal carotid diameter as the denominator. CONTRAST:  70mL OMNIPAQUE IOHEXOL 350 MG/ML SOLN COMPARISON:  CT head earlier today FINDINGS: CTA NECK FINDINGS Aortic arch: Atherosclerotic aortic arch. Three-vessel arch. Proximal great vessels patent without stenosis. Right carotid system: Mild atherosclerotic disease right carotid bifurcation without significant stenosis. Retropharyngeal course of right internal carotid artery. Left carotid system:  Mild atherosclerotic disease left common carotid artery and left carotid bifurcation. Less than 25% diameter stenosis proximal left internal carotid artery. Retropharyngeal course of left internal carotid artery. Kissing carotids. Vertebral arteries: Both vertebral arteries patent to the basilar without significant stenosis. Mild atherosclerotic disease in the vertebral arteries bilaterally. Skeleton: Cervical spine degenerative change. No acute skeletal abnormality. Other neck: Negative for mass or adenopathy. 8 x 10 mm right thyroid nodule. No further imaging is necessary. Upper chest: Mild apical emphysema. No acute abnormality. Atherosclerotic aortic arch. Review of the MIP images confirms the above findings CTA HEAD FINDINGS Anterior circulation: Mild atherosclerotic disease in the cavernous carotid bilaterally without stenosis. Anterior and middle cerebral arteries patent bilaterally without stenosis or large vessel occlusion. 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 Posterior circulation: Both vertebral arteries patent to the basilar. PICA patent bilaterally. Basilar widely patent. Superior cerebellar and posterior cerebral arteries patent bilaterally without stenosis. Venous sinuses: Normal venous enhancement Anatomic variants: None Review of the MIP images confirms the above findings IMPRESSION: 1. No significant carotid or vertebral artery stenosis in the neck. Mild diffuse atherosclerotic disease. 2. Negative for intracranial large vessel occlusion or flow limiting stenosis. 3. Preliminary results texted to Dr.  Kirkpatrick Electronically Signed   By: Franchot Gallo M.D.   On: 03/02/2020 23:00   MR BRAIN WO CONTRAST  Result Date: 03/03/2020 CLINICAL DATA:  Blurry vision and speech difficulty EXAM: MRI HEAD WITHOUT CONTRAST TECHNIQUE: Multiplanar, multiecho pulse sequences of the brain and surrounding structures were obtained without intravenous contrast. COMPARISON:   None. FINDINGS: BRAIN: No acute infarct, acute hemorrhage or extra-axial collection. Multifocal white matter hyperintensity, most commonly due to chronic ischemic microangiopathy. Normal volume of CSF spaces. No chronic microhemorrhage. Normal midline structures. VASCULAR: Major flow voids are preserved. SKULL AND UPPER CERVICAL SPINE: Normal calvarium and skull base. Visualized upper cervical spine and soft tissues are normal. SINUSES/ORBITS: No paranasal sinus fluid levels or advanced mucosal thickening. No mastoid or middle ear effusion. Normal orbits. IMPRESSION: 1. No acute intracranial abnormality. 2. Mild chronic small vessel disease. Electronically Signed   By: Ulyses Jarred M.D.   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 EXAM: LEFT SHOULDER - 2+ VIEW COMPARISON:  Chest x-ray 05/27/2011. FINDINGS: Mild acromioclavicular glenohumeral degenerative change. No acute bony or joint abnormality identified. No evidence of fracture or dislocation. IMPRESSION: Mild acromioclavicular and glenohumeral degenerative change. No acute abnormality identified. Electronically Signed   By: Marcello Moores  Register   On: 02/23/2020 07:11   ECHOCARDIOGRAM COMPLETE BUBBLE STUDY  Result Date: 03/03/2020    ECHOCARDIOGRAM REPORT   Patient Name:   ANISHA ITA Date of Exam: 03/03/2020 Medical Rec #:  [id number]  Height:       63.0 in Accession #:    [id number] Weight:       164.7 lb Date of Birth:  Aug 24, 1941  BSA:          1.780 m Patient Age:    41 years   BP:           135/70 mmHg Patient Gender: F          HR:           55 bpm. Exam Location:  Inpatient Procedure: 2D Echo, Cardiac Doppler and Color Doppler Indications:    Stroke  History:        Patient has prior history of Echocardiogram examinations, most                 recent 05/26/2007. Risk Factors:Dyslipidemia, Hypertension and                 Former Smoker. CKD. GERD.  Sonographer:     Clayton Lefort RDCS (AE) Referring Phys: [id number] [place]  1. Left ventricular ejection fraction, by estimation, is 60 to 65%. The left ventricle has normal function. The left ventricle has no regional wall motion abnormalities. There is moderate left ventricular hypertrophy. Left ventricular diastolic parameters are consistent with Grade I diastolic dysfunction (impaired relaxation). Elevated left ventricular end-diastolic pressure.  2. Right ventricular systolic function is normal. The right ventricular size is normal. There is normal pulmonary artery systolic pressure.  3. The mitral valve is grossly normal. Trivial mitral valve regurgitation.  4. The aortic valve is tricuspid. Aortic valve regurgitation is not visualized.  5. The inferior vena cava is normal in size with greater than 50% respiratory variability, suggesting right atrial pressure of 3 mmHg.  6. Agitated saline contrast bubble study was negative, with no evidence of any interatrial shunt. FINDINGS  Left Ventricle: Left ventricular ejection fraction, by estimation, is 60 to 65%. The left ventricle has normal  function. The left ventricle has no regional wall motion abnormalities. The left ventricular internal cavity size was normal in size. There is  moderate left ventricular hypertrophy. Left ventricular diastolic parameters are consistent with Grade I diastolic dysfunction (impaired relaxation). Elevated left ventricular end-diastolic pressure. Right Ventricle: The right ventricular size is normal. No increase in right ventricular wall thickness. Right ventricular systolic function is normal. There is normal pulmonary artery systolic pressure. The tricuspid regurgitant velocity is 2.51 m/s, and  with an assumed right atrial pressure of 3 mmHg, the estimated right ventricular systolic pressure is [id number] mmHg. Left Atrium: Left atrial size was normal in size. Right Atrium: Right atrial size was normal in size. Pericardium: There is no  evidence of pericardial effusion. Mitral Valve: The mitral valve is grossly normal. Trivial mitral valve regurgitation. MV peak gradient, 6.6 mmHg. The mean mitral valve gradient is 2.0 mmHg. Tricuspid Valve: The tricuspid valve is grossly normal. Tricuspid valve regurgitation is trivial. Aortic Valve: The aortic valve is tricuspid. Aortic valve regurgitation is not visualized. Aortic valve mean gradient measures 5.0 mmHg. Aortic valve peak gradient measures 9.2 mmHg. Aortic valve area, by VTI measures 2.67 cm. Pulmonic Valve: The pulmonic valve was grossly normal. Pulmonic valve regurgitation is not visualized. Aorta: The aortic root and ascending aorta are structurally normal, with no evidence of dilitation. Venous: The inferior vena cava is normal in size with greater than 50% respiratory variability, suggesting right atrial pressure of 3 mmHg. IAS/Shunts: No atrial level shunt detected by color flow Doppler. Agitated saline contrast was given intravenously to evaluate for intracardiac shunting. Agitated saline contrast bubble study was negative, with no evidence of any interatrial shunt.  LEFT VENTRICLE PLAX 2D LVIDd:         3.70 cm  Diastology LVIDs:         2.30 cm  LV e' lateral:   8.05 cm/s LV PW:         1.20 cm  LV E/e' lateral: 12.2 LV IVS:        1.40 cm  LV e' medial:    5.11 cm/s LVOT diam:     2.00 cm  LV E/e' medial:  19.3 LV SV:         82 LV SV Index:   46 LVOT Area:     3.14 cm  RIGHT VENTRICLE             IVC RV Basal diam:  2.50 cm     IVC diam: 1.80 cm RV S prime:     13.50 cm/s TAPSE (M-mode): 2.9 cm LEFT ATRIUM             Index       RIGHT ATRIUM           Index LA diam:        2.40 cm 1.35 cm/m  RA Area:     12.80 cm LA Vol (A2C):   67.2 ml 37.74 ml/m RA Volume:   28.50 ml  16.01 ml/m LA Vol (A4C):   51.5 ml 28.93 ml/m LA Biplane Vol: 59.9 ml 33.64 ml/m  AORTIC VALVE AV Area (Vmax):    2.04 cm AV Area (Vmean):   1.99 cm AV Area (VTI):     2.67 cm AV Vmax:           152.00 cm/s  AV Vmean:          101.000 cm/s AV VTI:  0.307 m AV Peak Grad:      9.2 mmHg AV Mean Grad:      5.0 mmHg LVOT Vmax:         98.70 cm/s LVOT Vmean:        64.000 cm/s LVOT VTI:          0.261 m LVOT/AV VTI ratio: 0.85  AORTA Ao Root diam: 3.20 cm Ao Asc diam:  3.40 cm MITRAL VALVE                TRICUSPID VALVE MV Area (PHT): 2.69 cm     TR Peak grad:   25.2 mmHg MV Peak grad:  6.6 mmHg     TR Vmax:        251.00 cm/s MV Mean grad:  2.0 mmHg MV Vmax:       1.28 m/s     SHUNTS MV Vmean:      53.8 cm/s    Systemic VTI:  0.26 m MV Decel Time: 282 msec     Systemic Diam: 2.00 cm MV E velocity: 98.50 cm/s MV A velocity: 111.00 cm/s MV E/A ratio:  0.89 Lyman Bishop MD Electronically signed by Lyman Bishop MD Signature Date/Time: 03/03/2020/2:56:41 PM    Final    CT HEAD CODE STROKE WO CONTRAST  Result Date: 03/02/2020 CLINICAL DATA:  Code stroke. Acute neuro deficit. Aphasia. Left-sided headache. EXAM: CT HEAD WITHOUT CONTRAST TECHNIQUE: Contiguous axial images were obtained from the base of the skull through the vertex without intravenous contrast. COMPARISON:  CT head 04/26/2016 FINDINGS: Brain: Generalized atrophy. Patchy white matter hypodensity bilaterally compatible with chronic microvascular ischemic change. Negative for acute infarct, hemorrhage, mass. Vascular: Negative for hyperdense vessel Skull: Negative Sinuses/Orbits: Mild mucosal edema paranasal sinuses. Bilateral cataract extraction. No orbital mass lesion. Other: None ASPECTS ([place] Stroke Program Early CT Score) - Ganglionic level infarction (caudate, lentiform nuclei, internal capsule, insula, M1-M3 cortex): 7 - Supraganglionic infarction (M4-M6 cortex): 3 Total score (0-10 with 10 being normal): 10 IMPRESSION: 1. No acute abnormality 2. ASPECTS is 10 3. Mild atrophy and chronic microvascular ischemic change in the white matter. 4. Preliminary results texted to Dr. Leonel Ramsay via Shea Evans Electronically Signed   By: Franchot Gallo M.D.    On: 03/02/2020 22:48    Microbiology: Recent Results (from the past 240 hour(s))  SARS CORONAVIRUS 2 (TAT 6-24 HRS) Nasopharyngeal Nasopharyngeal Swab     Status: None   Collection Time: 03/02/20 11:48 PM   Specimen: Nasopharyngeal Swab  Result Value Ref Range Status   SARS Coronavirus 2 NEGATIVE NEGATIVE Final    Comment: (NOTE) SARS-CoV-2 target nucleic acids are NOT DETECTED. The SARS-CoV-2 RNA is generally detectable in upper and lower respiratory specimens during the acute phase of infection. Negative results do not preclude SARS-CoV-2 infection, do not rule out co-infections with other pathogens, and should not be used as the sole basis for treatment or other patient management decisions. Negative results must be combined with clinical observations, patient history, and epidemiological information. The expected result is Negative. Fact Sheet for Patients: SugarRoll[employer] Fact Sheet for Healthcare Providers: https://www.woods-mathews.com/ This test is not yet approved or cleared by the [place] FDA and  has been authorized for detection and/or diagnosis of SARS-CoV-2 by FDA under an Emergency Use Authorization (EUA). This EUA will remain  in effect (meaning this test can be used) for the duration of the COVID-19 declaration under Section 56 4(b)(1) of the Act, 21 U.S.C. section 360bbb-3(b)(1), unless the authorization is terminated or  revoked sooner. Performed at [facility], [place] [address], [place], [place] [address]      Labs: Basic Metabolic Panel: Recent Labs  Lab 03/02/20 2238 03/02/20 2241 03/03/20 0727 03/04/20 0451  NA 140 140 141 143  K 4.7 5.3* 5.0 5.3*  CL 105 103 108 112*  CO2  --  24 24 26   GLUCOSE 128* 128* 102* 92  BUN 54* 44* 40* 29*  CREATININE 1.60* 1.55* 1.42* 1.55*  CALCIUM  --  9.5 9.0 9.2  PHOS  --   --  3.7  --    Liver Function Tests: Recent Labs  Lab 03/02/20 2241 03/03/20 0727    AST 33  --   ALT 21  --   ALKPHOS 57  --   BILITOT 1.4*  --   PROT 6.8  --   ALBUMIN 3.9 3.4*   No results for input(s): LIPASE, AMYLASE in the last 168 hours. No results for input(s): AMMONIA in the last 168 hours. CBC: Recent Labs  Lab 03/02/20 2238 03/02/20 2241  WBC  --  8.4  NEUTROABS  --  5.3  HGB 13.3 12.9  HCT 39.0 40.1  MCV  --  101.3*  PLT  --  210   Cardiac Enzymes: No results for input(s): CKTOTAL, CKMB, CKMBINDEX, TROPONINI in the last 168 hours. BNP: BNP (last 3 results) No results for input(s): BNP in the last 8760 hours.  ProBNP (last 3 results) No results for input(s): PROBNP in the last 8760 hours.  CBG: Recent Labs  Lab 03/02/20 2232  GLUCAP 120*       Signed:  Codi Lansden  [facility] 03/04/2020, 11:23 AM

## 2020-03-04 NOTE — Progress Notes (Signed)
OT Cancellation Note  Patient Details Name: Carla Lyons MRN: [id number] DOB: [birth year]   Cancelled Treatment:    Reason Eval/Treat Not Completed: OT screened, no needs identified, will sign off.  Per PT pt appears back to baseline, and MRI negative for stroke.    Nilsa Nutting., OTR/L Acute Rehabilitation Services Pager [phone] Office [phone]   Lucille Passy M 03/04/2020, 11:01 AM

## 2020-03-04 NOTE — Progress Notes (Addendum)
Neurology Progress Note: Reason for Consult: Aphasia Referring Physician:  Roxine Caddy  CC: no further h/a  Interval Hx: No further spells of confusion. MRI neg for stroke. EEG neg. Pt feels back to baseline this am and h/a now resolved.    Exam: Current vital signs: BP 139/64 (BP Location: Left Arm)   Pulse 61   Temp 98.1 F (36.7 C) (Oral)   Resp 16   Ht 5\' 3"  (1.6 m)   Wt 74.7 kg   SpO2 98%   BMI 29.18 kg/m  Vital signs in last 24 hours: Temp:  [98.1 F (36.7 C)-98.2 F (36.8 C)] 98.1 F (36.7 C) (05/30 0824) Pulse Rate:  [58-68] 61 (05/30 0824) Resp:  [16-18] 16 (05/30 0824) BP: (138-157)/(60-70) 139/64 (05/30 0824) SpO2:  [95 %-100 %] 98 % (05/30 0824)   Physical Exam  Constitutional: Appears well-developed and well-nourished.  Psych: Affect appropriate to situation Eyes: No scleral injection HENT: No OP obstrucion MSK: no joint deformities.  Cardiovascular: Normal rate and regular rhythm.  Respiratory: Effort normal, non-labored breathing GI: Soft.  No distension. There is no tenderness.  Skin: WDI  Neuro: Mental Status: Patient is awake, alert, oriented to person, place, month, year, and situation. Patient is able to give a clear and coherent history. No signs of aphasia or neglect Cranial Nerves: II: Visual Fields are full. Pupils are equal, round, and reactive to light.   III,IV, VI: EOMI without ptosis or diploplia.  V: Facial sensation is symmetric to temperature VII: Facial movement is symmetric.  VIII: hearing is intact to voice X: Uvula elevates symmetrically XI: Shoulder shrug is symmetric. XII: tongue is midline without atrophy or fasciculations.  Motor: Tone is normal. Bulk is normal. 5/5 strength was present in all four extremities.  Sensory: Sensation is symmetric to light touch and temperature in the arms and legs. Cerebellar: FNF and HKS are intact bilaterally   I have reviewed labs in epic and the results pertinent to this  consultation are: Creatinine 1.6  I have reviewed the images and tests obtained: CT head-unremarkable. MRI neg for acute finding. EEG: Normal electroencephalogram, awake and asleep. There are no focal lateralizing or epileptiform features.  A/P: 79 year old female with acute confusional episode followed by headache.  She does not have a history of migraines, and new onset migraines at age 37 would be unusual.  Possibilities include TIA, though the bright light described would be suggestive of migraine aura or seizure as another possibility. EEG neg. MRI neg. ECHO 55%, wnl. LDL is 117, goal <100, thus lipitor 20mg  po started as there is no role in high intensity statin at this time. Pt has no DM2. Mild HTN and age are other stroke risk factors.    Recommendations: Stroke risks and prevention reviewed and educated with pt ASA 81mg  daily Normotension Lipitor 20mg  daily No AED at this time No driving till out pt neuro f/u.  OTC h/a meds for symptomatic control GNA for neurology f/u in 4-6 weeks We will s/o. Please call back if needed.   Desiree Metzger-Cihelka, ARNP-C, ANVP-BC Pager: [phone] If 7pm- 7am, please page neurology on call as listed in [place].   NEUROHOSPITALIST ADDENDUM Performed a face to face diagnostic evaluation.   I have reviewed the contents of history and physical exam as documented by PA/ARNP/Resident and agree with above documentation.  I have discussed and formulated the above plan as documented. Edits to the note have been made as needed.  Spoke to the patient today-fairly acute onset  presentation of bright light in both her eyes followed by complete gibberish while speaking, difficulty recognizing faces and difficulty speaking.  It was followed by a relatively severe headache-denies any associated nausea or vomiting.  Was throbbing more so in the left side, although in H&P was stated to be bifrontal. Her confusion completely resolved in the emergency  department-still had a mild headache that improved after receiving Fioricet and Reglan.  She again expresses to me that she has never gets headaches nor has a history of migraines.  On description of event suggestive of complex migraine, TIA versus seizure should also be considered.  TIA work-up completed-no acute stroke, no significant stenosis on vascular imaging.  Consider baby aspirin. Could have been a partial seizure. Will not start on antiepileptic agent as EEG was normal and this is her first event.  However would caution against driving until cleared by neurology on follow-up.  Rare considerations include amyloid spells however patient does not have any history of memory loss and no hemorrhages seen on SWI.  Neurology will be available as needed.  No further inpatient work-up needed.  Thanks for the consult  Recommend outpatient neurology follow-up in 4 weeks.  No driving until follow-up         Marijah Larranaga MD [facility] [id number]   If 7pm to 7am, please call on call as listed on [facility].

## 2020-03-04 NOTE — Discharge Instructions (Signed)
Transient Ischemic Attack  A transient ischemic attack (TIA) is a "warning stroke" that causes stroke-like symptoms that go away quickly. A TIA does not cause lasting damage to the brain. But having a TIA is a sign that you may be at risk for a stroke. Lifestyle changes and medical treatments can help prevent a stroke. It is important to know the symptoms of a TIA and what to do. Get help right away, even if your symptoms go away. The symptoms of a TIA are the same as those of a stroke. They can happen fast, and they usually go away within minutes or hours. They can include:  Weakness or loss of feeling in your face, arm, or leg. This often happens on one side of your body.  Trouble walking.  Trouble moving your arms or legs.  Trouble talking or understanding what people are saying.  Trouble seeing.  Seeing two of one object (double vision).  Feeling dizzy.  Feeling confused.  Loss of balance or coordination.  Feeling sick to your stomach (nauseous) and throwing up (vomiting).  A very bad headache for no reason. What increases the risk? Certain things may make you more likely to have a TIA. Some of these are things that you can change, such as:  Being very overweight (obese).  Using products that contain nicotine or tobacco, such as cigarettes and e-cigarettes.  Taking birth control pills.  Not being active.  Drinking too much alcohol.  Using drugs. Other risk factors include:  Having an irregular heartbeat (atrial fibrillation).  Being African American or Hispanic.  Having had blood clots, stroke, TIA, or heart attack in the past.  Being a woman with a history of high blood pressure in pregnancy (preeclampsia).  Being over the age of 60.  Being female.  Having family history of stroke.  Having the following diseases or conditions: ? High blood pressure. ? High cholesterol. ? Diabetes. ? Heart disease. ? Sickle cell disease. ? Sleep apnea. ? Migraine  headache. ? Long-term (chronic) diseases that cause soreness and swelling (inflammation). ? Disorders that affect how your blood clots. Follow these instructions at home: Medicines   Take over-the-counter and prescription medicines only as told by your doctor.  If you were told to take aspirin or another medicine to thin your blood, take it exactly as told by your doctor. ? Taking too much of the medicine can cause bleeding. ? Taking too little of the medicine may not work to treat the problem. Eating and drinking   Eat 5 or more servings of fruits and vegetables each day.  Follow instructions from your doctor about your diet. You may need to follow a certain diet to help lower your risk of having a stroke. You may need to: ? Eat a diet that is low in fat and salt. ? Eat foods that contain a lot of fiber. ? Limit the amount of carbohydrates and sugar in your diet.  Limit alcohol intake to 1 drink a day for nonpregnant women and 2 drinks a day for men. One drink equals 12 oz of beer, 5 oz of wine, or 1 oz of hard liquor. General instructions  Keep a healthy weight.  Stay active. Try to get at least 30 minutes of activity on all or most days.  Find out if you have a condition called sleep apnea. Get treatment if needed.  Do not use any products that contain nicotine or tobacco, such as cigarettes and e-cigarettes. If you need help quitting,   ask your doctor.  Do not abuse drugs.  Keep all follow-up visits as told by your doctor. This is important. Get help right away if:  You have any signs of stroke. "BE FAST" is an easy way to remember the main warning signs: ? B - Balance. Signs are dizziness, sudden trouble walking, or loss of balance. ? E - Eyes. Signs are trouble seeing or a sudden change in how you see. ? F - Face. Signs are sudden weakness or loss of feeling of the face, or the face or eyelid drooping on one side. ? A - Arms. Signs are weakness or loss of feeling in an  arm. This happens suddenly and usually on one side of the body. ? S - Speech. Signs are sudden trouble speaking, slurred speech, or trouble understanding what people say. ? T - Time. Time to call emergency services. Write down what time symptoms started.  You have other signs of stroke, such as: ? A sudden, very bad headache with no known cause. ? Feeling sick to your stomach (nausea). ? Throwing up (vomiting). ? Jerky movements that you cannot control (seizure). These symptoms may be an emergency. Do not wait to see if the symptoms will go away. Get medical help right away. Call your local emergency services (911 in the [place] Do not drive yourself to the hospital. Summary  A transient ischemic attack (TIA) is a "warning stroke" that causes stroke-like symptoms that go away quickly.  A TIA is a medical emergency. Get help right away, even if your symptoms go away.  A TIA does not cause lasting damage to the brain.  Having a TIA is a sign that you may be at risk for a stroke. Lifestyle changes and medical treatments can help prevent a stroke. This information is not intended to replace advice given to you by your health care provider. Make sure you discuss any questions you have with your health care provider. Document Revised: 06/18/2018 Document Reviewed: 12/24/2016 Elsevier Patient Education  [place] Headache Without Cause A headache is pain or discomfort that is felt around the head or neck area. There are many causes and types of headaches. In some cases, the cause may not be found. Follow these instructions at home: Watch your condition for any changes. Let your doctor know about them. Take these steps to help with your condition: Managing pain      Take over-the-counter and prescription medicines only as told by your doctor.  Lie down in a dark, quiet room when you have a headache.  If told, put ice on your head and neck area: ? Put ice in a plastic  bag. ? Place a towel between your skin and the bag. ? Leave the ice on for 20 minutes, 2-3 times per day.  If told, put heat on the affected area. Use the heat source that your doctor recommends, such as a moist heat pack or a heating pad. ? Place a towel between your skin and the heat source. ? Leave the heat on for 20-30 minutes. ? Remove the heat if your skin turns bright red. This is very important if you are unable to feel pain, heat, or cold. You may have a greater risk of getting burned.  Keep lights dim if bright lights bother you or make your headaches worse. Eating and drinking  Eat meals on a regular schedule.  If you drink alcohol: ? Limit how much you use to:  0-1 drink  a day for women.  0-2 drinks a day for men. ? Be aware of how much alcohol is in your drink. In the [place] one drink equals one 12 oz bottle of beer (355 mL), one 5 oz glass of wine (148 mL), or one 1 oz glass of hard liquor (44 mL).  Stop drinking caffeine, or reduce how much caffeine you drink. General instructions   Keep a journal to find out if certain things bring on headaches. For example, write down: ? What you eat and drink. ? How much sleep you get. ? Any change to your diet or medicines.  Get a massage or try other ways to relax.  Limit stress.  Sit up straight. Do not tighten (tense) your muscles.  Do not use any products that contain nicotine or tobacco. This includes cigarettes, e-cigarettes, and chewing tobacco. If you need help quitting, ask your doctor.  Exercise regularly as told by your doctor.  Get enough sleep. This often means 7-9 hours of sleep each night.  Keep all follow-up visits as told by your doctor. This is important. Contact a doctor if:  Your symptoms are not helped by medicine.  You have a headache that feels different than the other headaches.  You feel sick to your stomach (nauseous) or you throw up (vomit).  You have a fever. Get help right away  if:  Your headache gets very bad quickly.  Your headache gets worse after a lot of physical activity.  You keep throwing up.  You have a stiff neck.  You have trouble seeing.  You have trouble speaking.  You have pain in the eye or ear.  Your muscles are weak or you lose muscle control.  You lose your balance or have trouble walking.  You feel like you will pass out (faint) or you pass out.  You are mixed up (confused).  You have a seizure. Summary  A headache is pain or discomfort that is felt around the head or neck area.  There are many causes and types of headaches. In some cases, the cause may not be found.  Keep a journal to help find out what causes your headaches. Watch your condition for any changes. Let your doctor know about them.  Contact a doctor if you have a headache that is different from usual, or if your headache is not helped by medicine.  Get help right away if your headache gets very bad, you throw up, you have trouble seeing, you lose your balance, or you have a seizure. This information is not intended to replace advice given to you by your health care provider. Make sure you discuss any questions you have with your health care provider. Document Revised: 04/12/2018 Document Reviewed: 04/12/2018 Elsevier Patient Education  [place].

## 2020-03-04 NOTE — Plan of Care (Signed)
  Problem: Education: Goal: Knowledge of disease or condition will improve Outcome: Progressing Goal: Knowledge of secondary prevention will improve Outcome: Progressing Goal: Knowledge of patient specific risk factors addressed and post discharge goals established will improve Outcome: Progressing Goal: Individualized Educational Video(s) Outcome: Progressing   

## 2020-03-04 NOTE — Care Management Obs Status (Signed)
[place] NOTIFICATION   Patient Details  Name: Loreatha Bacco MRN: [id number] Date of Birth: Mar 28, 1941   Medicare Observation Status Notification Given:  Yes    Carles Collet, RN 03/04/2020, 11:10 AM

## 2020-03-07 ENCOUNTER — Other Ambulatory Visit: Payer: Self-pay

## 2020-03-07 ENCOUNTER — Telehealth: Payer: Self-pay | Admitting: Family Medicine

## 2020-03-07 ENCOUNTER — Ambulatory Visit (INDEPENDENT_AMBULATORY_CARE_PROVIDER_SITE_OTHER): Payer: Medicare Other | Admitting: Family Medicine

## 2020-03-07 ENCOUNTER — Encounter: Payer: Self-pay | Admitting: Family Medicine

## 2020-03-07 VITALS — BP 142/82 | HR 63 | Temp 98.3°F | Wt 161.0 lb

## 2020-03-07 DIAGNOSIS — S90861A Insect bite (nonvenomous), right foot, initial encounter: Secondary | ICD-10-CM | POA: Diagnosis not present

## 2020-03-07 DIAGNOSIS — N1832 Chronic kidney disease, stage 3b: Secondary | ICD-10-CM | POA: Diagnosis not present

## 2020-03-07 DIAGNOSIS — L03115 Cellulitis of right lower limb: Secondary | ICD-10-CM | POA: Diagnosis not present

## 2020-03-07 DIAGNOSIS — Z09 Encounter for follow-up examination after completed treatment for conditions other than malignant neoplasm: Secondary | ICD-10-CM

## 2020-03-07 DIAGNOSIS — E039 Hypothyroidism, unspecified: Secondary | ICD-10-CM

## 2020-03-07 DIAGNOSIS — W57XXXA Bitten or stung by nonvenomous insect and other nonvenomous arthropods, initial encounter: Secondary | ICD-10-CM

## 2020-03-07 MED ORDER — LEVOTHYROXINE SODIUM 88 MCG PO TABS: 88.0000 ug | ORAL_TABLET | Freq: Every day | ORAL | 3 refills | Status: DC

## 2020-03-07 MED ORDER — DOXYCYCLINE HYCLATE 100 MG PO CAPS: 100.0000 mg | ORAL_CAPSULE | Freq: Two times a day (BID) | ORAL | 0 refills | Status: AC

## 2020-03-07 NOTE — Telephone Encounter (Signed)
error 

## 2020-03-07 NOTE — Progress Notes (Signed)
Subjective:    Patient ID: Carla Lyons, female    DOB: 1941/09/08, 79 y.o.   MRN: [id number]  HPI Chief Complaint  Patient presents with  . Insect Bite    Pt removed a tick from in between middle toes on 02/22/20, pain and redness set in last night. Shooting and throbbing pain in right foot/toes. Pt states that the tick was dead when she pulled it out of her foot.    This is a 79 year old female who presents today for above chief complaint as well as follow-up from recent hospital stay.  A couple of weeks ago, she noticed something between her second and third toes on her right foot.  Upon further inspection, it was noted to be a tick.  She was able to remove it with tweezers.  The tick was dead but engorged and she is not sure how long it was there.  She mows her lawn as well as does outdoor activities.  She denies headache, new muscle or joint pain, or fever.  This morning, she noticed a red area on the top of her right foot.  It is painful.  She was admitted to the hospital for observation on May 28 and discharged on May 30.  Discharge diagnosis was TIA versus complicated migraine.  She presented following sudden onset of new symptoms of rapid flashing of bright lights in her eyes, blurry vision and difficulty with speech.  Work-up was negative.  All symptoms have resolved.  She has follow-up appointment with neurology later this month.  She reports that she has resumed all of her previous activities.  There were no changes in her medications.   Review of Systems    Per HPI Objective:   Physical Exam Vitals reviewed.  Constitutional:      General: She is not in acute distress.    Appearance: Normal appearance. She is normal weight. She is not ill-appearing, toxic-appearing or diaphoretic.  Eyes:     Conjunctiva/sclera: Conjunctivae normal.  Cardiovascular:     Rate and Rhythm: Normal rate and regular rhythm.     Pulses: Normal pulses.     Heart sounds: Normal heart sounds.    Pulmonary:     Effort: Pulmonary effort is normal.     Breath sounds: Normal breath sounds.  Musculoskeletal:     Cervical back: Normal range of motion and neck supple.     Comments: Normal gait with no assistive devices.   Skin:    General: Skin is warm and dry.     Findings: Rash (area of erythema and slight warmth just distal to toes of right foot. Area covers approximately 1/3 of dorsum of foot. There is a pin prick sized lesion on medial aspect of third toe. Patient reports that tick was attached to second toe. No lesions noted. ) present.  Neurological:     Mental Status: She is alert and oriented to person, place, and time.  Psychiatric:        Mood and Affect: Mood normal.        Behavior: Behavior normal.        Thought Content: Thought content normal.        Judgment: Judgment normal.          BP (!) 142/82 (BP Location: Left Arm, Patient Position: Sitting, Cuff Size: Normal)   Pulse 63   Temp 98.3 F (36.8 C) (Temporal)   Wt 161 lb (73 kg)   SpO2 98%   BMI 28.52 kg/m  Assessment & Plan:  1. Cellulitis of right foot - will cover with antibiotic to also cover tick borne illness - patient was instructed to keep foot elevated when sitting, to notify office if any increased redness, pain, fever.  - doxycycline (VIBRAMYCIN) 100 MG capsule; Take 1 capsule (100 mg total) by mouth every 12 (twelve) hours for 10 days. Take with food and full glass of water. Avoid sun exposure.  Dispense: 20 capsule; Refill: 0  2. Tick bite of right foot, initial encounter - doxycycline (VIBRAMYCIN) 100 MG capsule; Take 1 capsule (100 mg total) by mouth every 12 (twelve) hours for 10 days. Take with food and full glass of water. Avoid sun exposure.  Dispense: 20 capsule; Refill: 0  3. Hypothyroidism, unspecified type - levothyroxine (SYNTHROID) 88 MCG tablet; Take 1 tablet (88 mcg total) by mouth daily before breakfast.  Dispense: 90 tablet; Refill: 3  4. Stage 3b chronic kidney  disease - Basic Metabolic Panel; Future  5. Hospital discharge follow-up - Patient admitted for observation with negative work up, will check her bmet in one week and she will follow up with neurology as scheduled  This visit occurred during the SARS-CoV-2 public health emergency.  Safety protocols were in place, including screening questions prior to the visit, additional usage of staff PPE, and extensive cleaning of exam room while observing appropriate contact time as indicated for disinfecting solutions.      Clarene Reamer, FNP-BC  [facility]  03/08/2020 8:26 AM

## 2020-03-07 NOTE — Patient Instructions (Signed)
Good to see you today  Please schedule a lab only visit for 1 week to check kidney function  I have sent in an antibiotic for you to take every 12 hours for 10 days. Avoid sun! Cover up. Take with food and full glass of water.

## 2020-03-12 ENCOUNTER — Ambulatory Visit: Payer: Medicare Other | Admitting: Family Medicine

## 2020-03-14 ENCOUNTER — Other Ambulatory Visit (INDEPENDENT_AMBULATORY_CARE_PROVIDER_SITE_OTHER): Payer: Medicare Other

## 2020-03-14 DIAGNOSIS — N1832 Chronic kidney disease, stage 3b: Secondary | ICD-10-CM

## 2020-03-14 LAB — BASIC METABOLIC PANEL
BUN: 27 mg/dL — ABNORMAL HIGH (ref 6–23)
CO2: 28 mEq/L (ref 19–32)
Calcium: 9.1 mg/dL (ref 8.4–10.5)
Chloride: 106 mEq/L (ref 96–112)
Creatinine, Ser: 1.22 mg/dL — ABNORMAL HIGH (ref 0.40–1.20)
GFR: 42.63 mL/min — ABNORMAL LOW (ref >60.00)
Glucose, Bld: 91 mg/dL (ref 70–99)
Potassium: 4.3 mEq/L (ref 3.5–5.1)
Sodium: 140 mEq/L (ref 135–145)

## 2020-03-20 LAB — HM MAMMOGRAPHY

## 2020-04-17 IMAGING — DX CERVICAL SPINE - COMPLETE 4+ VIEW
6 series · 6 of 6 positions shown · non-contrast
Comparison: None.

CLINICAL DATA: Cervical neck pain for 1 month. No known injury.

EXAM:
CERVICAL SPINE - COMPLETE 4+ VIEW

[c-spine lat]
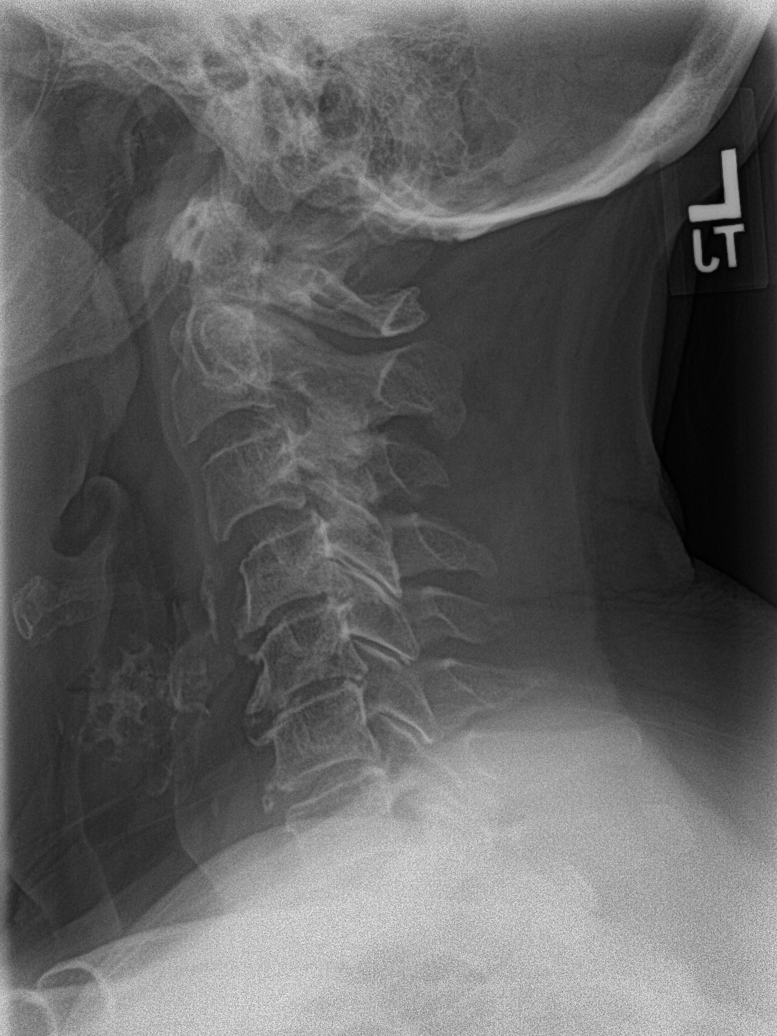

[c-spine obl (1 of 2)]
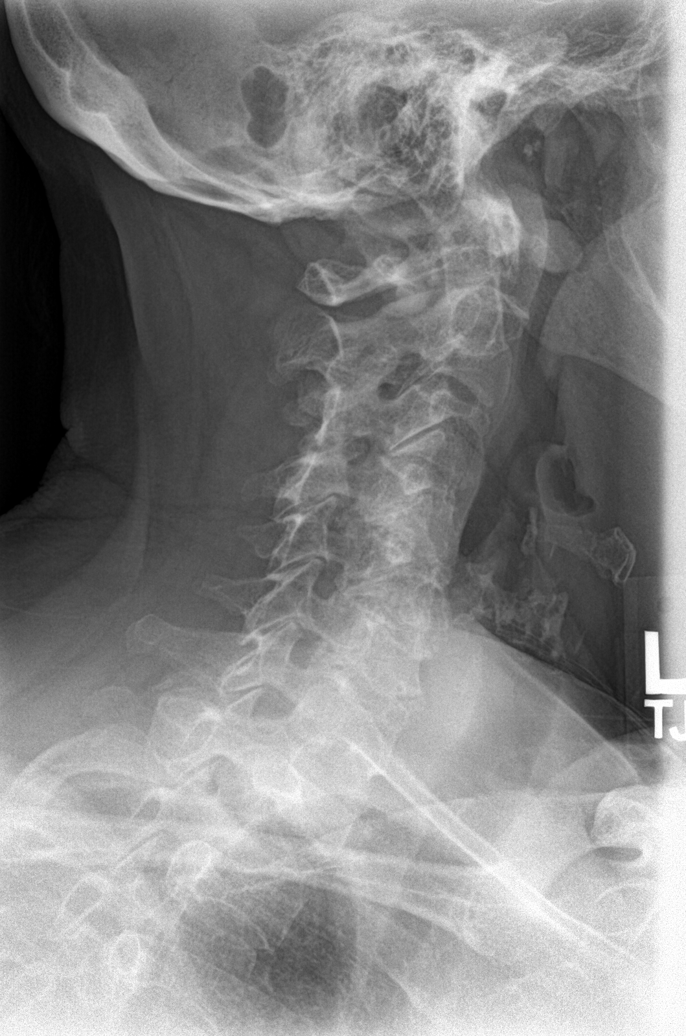

[c-spine obl (2 of 2)]
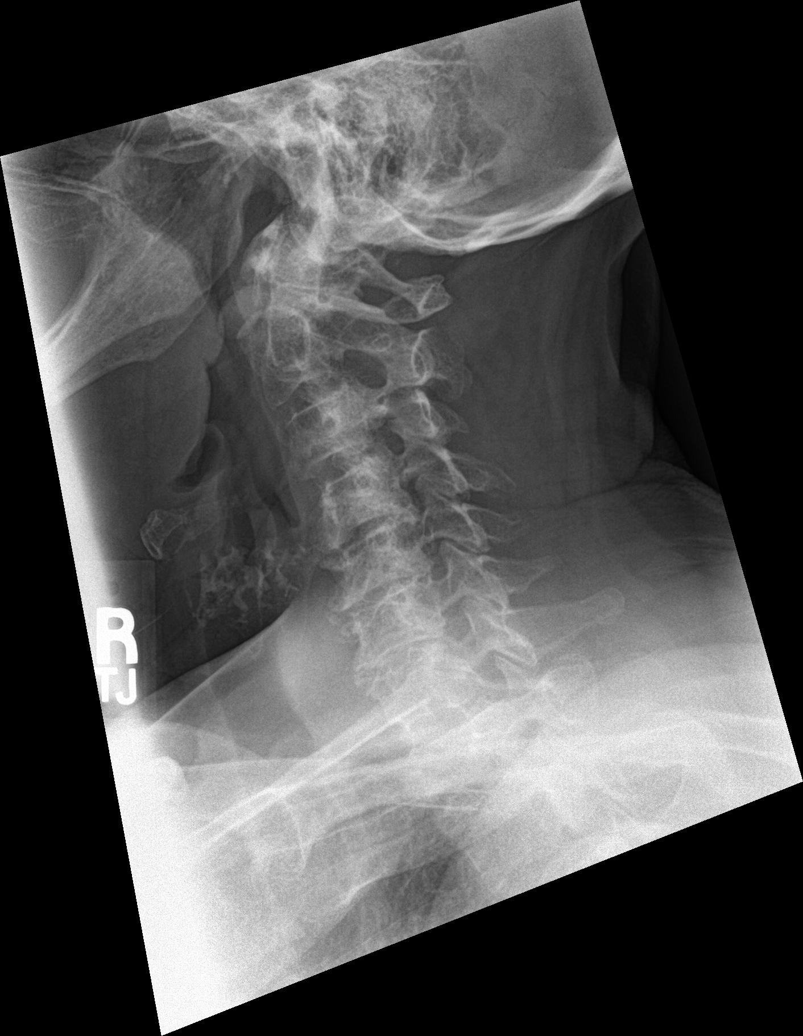

[c-spine ap]
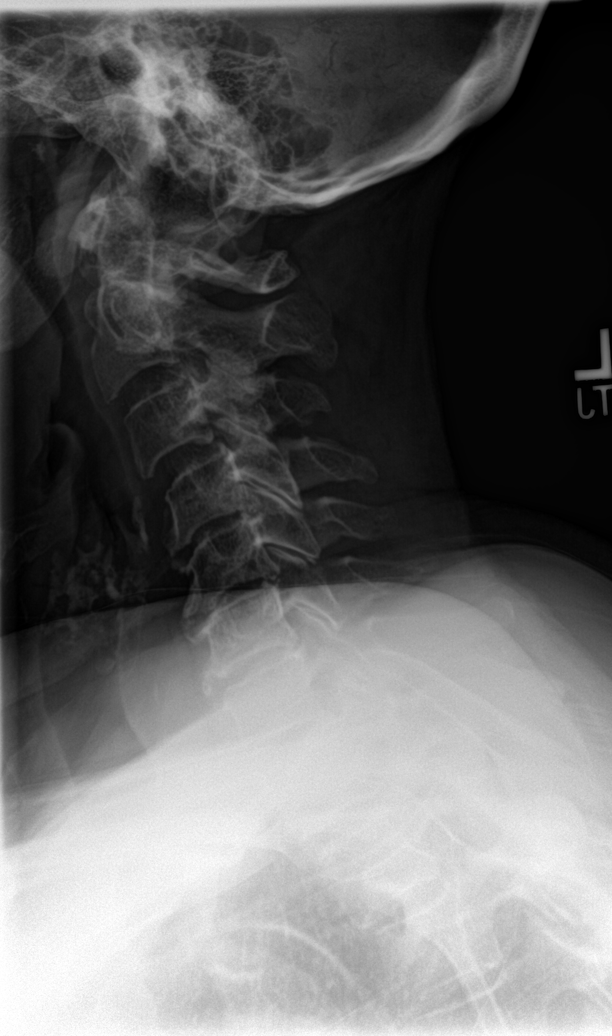

[c-spine open mouth]
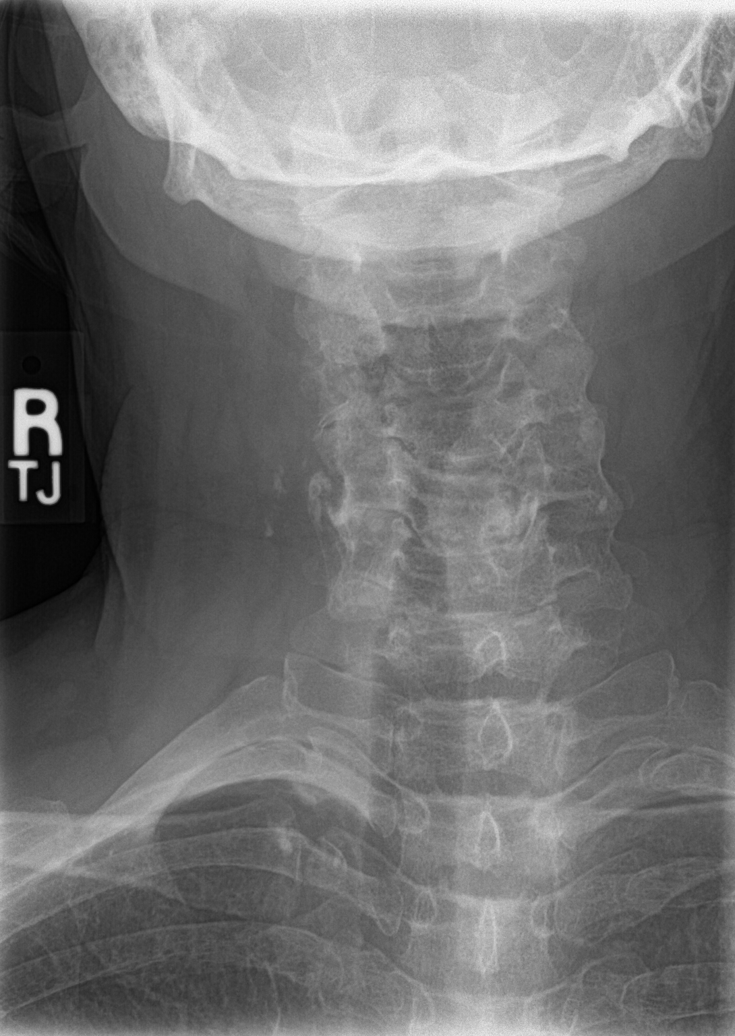

[c-spine swimmers]
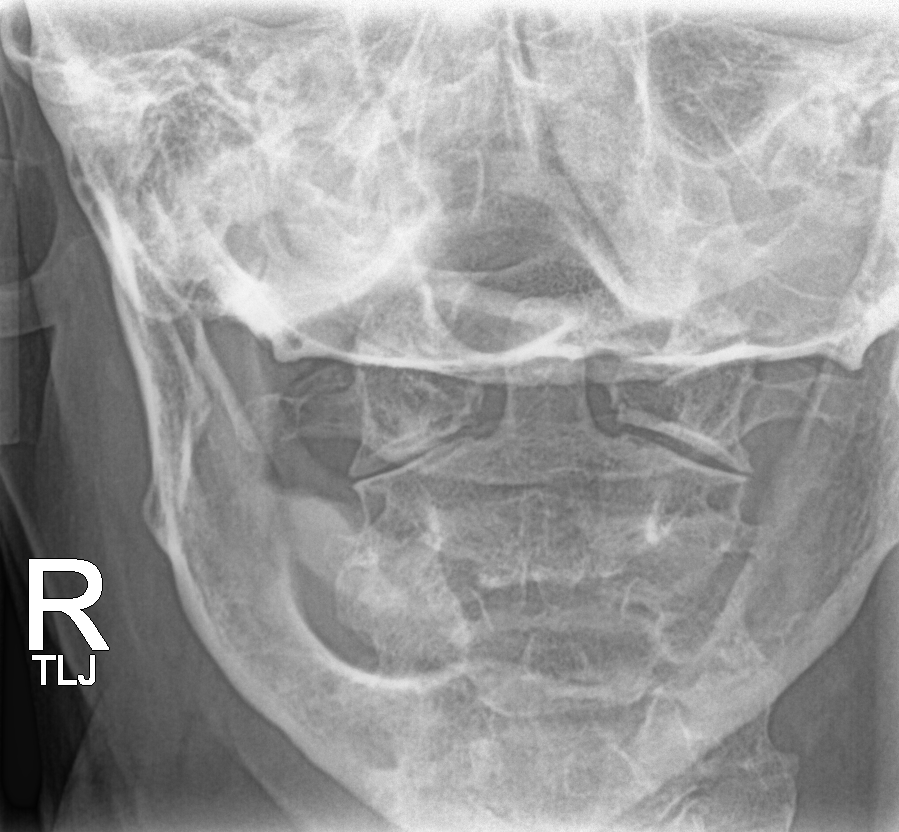

[6 of 6 positions shown; findings below may reference images not displayed]

FINDINGS: Degenerative spondylosis at the C4-5 through C6-7 levels, mild to
moderate in degree with associated disc space narrowing and osseous
spurring. Additional degenerative facet hypertrophy within the upper
cervical spine. Slight reversal of the normal cervical spine
lordosis is likely related to these underlying degenerative changes.
No evidence of acute vertebral body subluxation.

No acute or suspicious osseous lesion. No fracture line or displaced
fracture fragment seen. Prevertebral soft tissues are normal in
thickness. Carotid artery atherosclerosis on the RIGHT.
IMPRESSION: 1. Degenerative changes of the cervical spine, mild to moderate in
degree, as detailed above. Suspect associated nerve root impingement
at multiple levels.
2. No acute findings.
3. Carotid atherosclerosis.

## 2020-04-18 ENCOUNTER — Other Ambulatory Visit
Admission: RE | Admit: 2020-04-18 | Discharge: 2020-04-18 | Disposition: A | Discharge status: Home / Self Care | Payer: Medicare Other | Source: Ambulatory Visit | Attending: Internal Medicine | Admitting: Internal Medicine

## 2020-04-18 ENCOUNTER — Ambulatory Visit (INDEPENDENT_AMBULATORY_CARE_PROVIDER_SITE_OTHER): Payer: Medicare Other | Admitting: Family Medicine

## 2020-04-18 ENCOUNTER — Ambulatory Visit
Admission: RE | Admit: 2020-04-18 | Discharge: 2020-04-18 | Disposition: A | Discharge status: Home / Self Care | Payer: Medicare Other | Source: Ambulatory Visit | Attending: Internal Medicine | Admitting: Internal Medicine

## 2020-04-18 ENCOUNTER — Encounter: Payer: Self-pay | Admitting: Internal Medicine

## 2020-04-18 ENCOUNTER — Telehealth: Payer: Self-pay | Admitting: *Deleted

## 2020-04-18 ENCOUNTER — Other Ambulatory Visit: Payer: Self-pay

## 2020-04-18 ENCOUNTER — Encounter: Payer: Self-pay | Admitting: Family Medicine

## 2020-04-18 ENCOUNTER — Ambulatory Visit (INDEPENDENT_AMBULATORY_CARE_PROVIDER_SITE_OTHER): Payer: Medicare Other | Admitting: Internal Medicine

## 2020-04-18 VITALS — BP 158/82 | HR 47 | Ht 63.0 in | Wt 166.1 lb

## 2020-04-18 VITALS — BP 158/82 | HR 50 | Temp 98.3°F | Ht 63.0 in | Wt 168.0 lb

## 2020-04-18 DIAGNOSIS — R0602 Shortness of breath: Secondary | ICD-10-CM | POA: Insufficient documentation

## 2020-04-18 DIAGNOSIS — R001 Bradycardia, unspecified: Secondary | ICD-10-CM | POA: Insufficient documentation

## 2020-04-18 DIAGNOSIS — G459 Transient cerebral ischemic attack, unspecified: Secondary | ICD-10-CM

## 2020-04-18 DIAGNOSIS — R079 Chest pain, unspecified: Secondary | ICD-10-CM | POA: Diagnosis present

## 2020-04-18 DIAGNOSIS — M7989 Other specified soft tissue disorders: Secondary | ICD-10-CM | POA: Diagnosis not present

## 2020-04-18 DIAGNOSIS — I10 Essential (primary) hypertension: Secondary | ICD-10-CM

## 2020-04-18 LAB — CBC WITH DIFFERENTIAL/PLATELET
Abs Immature Granulocytes: 0.03 10*3/uL (ref 0.00–0.07)
Basophils Absolute: 0.1 10*3/uL (ref 0.0–0.1)
Basophils Relative: 1 %
Eosinophils Absolute: 0.2 10*3/uL (ref 0.0–0.5)
Eosinophils Relative: 3 %
HCT: 35.6 % — ABNORMAL LOW (ref 36.0–46.0)
Hemoglobin: 11.6 g/dL — ABNORMAL LOW (ref 12.0–15.0)
Immature Granulocytes: 0 %
Lymphocytes Relative: 20 %
Lymphs Abs: 1.5 10*3/uL (ref 0.7–4.0)
MCH: 32.4 pg (ref 26.0–34.0)
MCHC: 32.6 g/dL (ref 30.0–36.0)
MCV: 99.4 fL (ref 80.0–100.0)
Monocytes Absolute: 0.8 10*3/uL (ref 0.1–1.0)
Monocytes Relative: 11 %
Neutro Abs: 4.7 10*3/uL (ref 1.7–7.7)
Neutrophils Relative %: 65 %
Platelets: 188 10*3/uL (ref 150–400)
RBC: 3.58 MIL/uL — ABNORMAL LOW (ref 3.87–5.11)
RDW: 12.9 % (ref 11.5–15.5)
WBC: 7.2 10*3/uL (ref 4.0–10.5)
nRBC: 0 % (ref 0.0–0.2)

## 2020-04-18 LAB — BASIC METABOLIC PANEL
Anion gap: 8 (ref 5–15)
BUN: 27 mg/dL — ABNORMAL HIGH (ref 8–23)
CO2: 26 mmol/L (ref 22–32)
Calcium: 9.4 mg/dL (ref 8.9–10.3)
Chloride: 109 mmol/L (ref 98–111)
Creatinine, Ser: 1.27 mg/dL — ABNORMAL HIGH (ref 0.44–1.00)
GFR calc Af Amer: 47 mL/min — ABNORMAL LOW (ref >60)
GFR calc non Af Amer: 40 mL/min — ABNORMAL LOW (ref >60)
Glucose, Bld: 107 mg/dL — ABNORMAL HIGH (ref 70–99)
Potassium: 3.9 mmol/L (ref 3.5–5.1)
Sodium: 143 mmol/L (ref 135–145)

## 2020-04-18 LAB — FIBRIN DERIVATIVES D-DIMER (ARMC ONLY): Fibrin derivatives D-dimer (ARMC): 1803.25 ng/mL (FEU) — ABNORMAL HIGH (ref 0.00–499.00)

## 2020-04-18 LAB — TSH: TSH: 1.246 u[IU]/mL (ref 0.350–4.500)

## 2020-04-18 MED ORDER — IOHEXOL 350 MG/ML SOLN
60.0000 mL | Freq: Once | INTRAVENOUS | Status: AC | PRN
  Administered 2020-04-18: 60 mL via INTRAVENOUS

## 2020-04-18 NOTE — Patient Instructions (Addendum)
Medication Instructions:  Your physician recommends that you continue on your current medications as directed. Please refer to the Current Medication list given to you today.  *If you need a refill on your cardiac medications before your next appointment, please call your pharmacy*   Lab Work: Your physician recommends that you return for lab work in: [place] - as you leave. - will check D-Dimer, CBC, BMP, TSH. - Please go to the [facility]. You will check in at the front desk to the right as you walk into the atrium. Valet Parking is offered if needed. - No appointment needed. You may go any day between 7 am and 6 pm.  If you have labs (blood work) drawn today and your tests are completely normal, you will receive your results only by: Marland Kitchen MyChart Message (if you have MyChart) OR . A paper copy in the mail If you have any lab test that is abnormal or we need to change your treatment, we will call you to review the results.   Testing/Procedures: Your physician has requested that you have a lexiscan myoview.   [place]  Your caregiver has ordered a Stress Test with nuclear imaging. The purpose of this test is to evaluate the blood supply to your heart muscle. This procedure is referred to as a "Non-Invasive Stress Test." This is because other than having an IV started in your vein, nothing is inserted or "invades" your body. Cardiac stress tests are done to find areas of poor blood flow to the heart by determining the extent of coronary artery disease (CAD). Some patients exercise on a treadmill, which naturally increases the blood flow to your heart, while others who are  unable to walk on a treadmill due to physical limitations have a pharmacologic/chemical stress agent called Lexiscan . This medicine will mimic walking on a treadmill by temporarily increasing your coronary blood flow.   Please note: these test may take anywhere between 2-4 hours to complete  PLEASE REPORT TO [place] AT THE FIRST DESK WILL DIRECT YOU WHERE TO GO  Date of Procedure:_____________________________________  Arrival Time for Procedure:______________________________  Instructions regarding medication:   _xx_:  Hold other medications as follows:____________Furosemide_______  PLEASE NOTIFY THE OFFICE AT LEAST 24 HOURS IN ADVANCE IF YOU ARE UNABLE TO KEEP YOUR APPOINTMENT.  [phone] AND  PLEASE NOTIFY NUCLEAR MEDICINE AT [facility] AT LEAST 24 HOURS IN ADVANCE IF YOU ARE UNABLE TO KEEP YOUR APPOINTMENT. [phone]  How to prepare for your Myoview test:  1. Do not eat or drink after midnight 2. No caffeine for 24 hours prior to test 3. No smoking 24 hours prior to test. 4. Your medication may be taken with water.  If your doctor stopped a medication because of this test, do not take that medication. 5. Ladies, please do not wear dresses.  Skirts or pants are appropriate. Please wear a short sleeve shirt. 6. No perfume, cologne or lotion. 7. Wear comfortable walking shoes. No heels   Follow-Up: At [facility], you and your health needs are our priority.  As part of our continuing mission to provide you with exceptional heart care, we have created designated Provider Care Teams.  These Care Teams include your primary Cardiologist (physician) and Advanced Practice Providers (APPs -  Physician Assistants and Nurse Practitioners) who all work together to provide you with the care you need, when you need it.  We recommend signing up for the patient portal called "  MyChart".  Sign up information is provided on this After Visit Summary.  MyChart is used to connect with patients for Virtual Visits (Telemedicine).  Patients are able to view lab/test results, encounter notes, upcoming appointments, etc.  Non-urgent messages can be sent to your provider as well.   To learn more about what you can do with MyChart, go to [email or web address].    Your next  appointment:   1 month(s)  The format for your next appointment:   In Person  Provider:    You may see DR Harrell Gave END or one of the following Advanced Practice Providers on your designated Care Team:    Murray Hodgkins, NP  Christell Faith, PA-C  Marrianne Mood, PA-C    Cardiac Nuclear Scan A cardiac nuclear scan is a test that measures blood flow to the heart when a person is resting and when he or she is exercising. The test looks for problems such as:  Not enough blood reaching a portion of the heart.  The heart muscle not working normally. You may need this test if:  You have heart disease.  You have had abnormal lab results.  You have had heart surgery or a balloon procedure to open up blocked arteries (angioplasty).  You have chest pain.  You have shortness of breath. In this test, a radioactive dye (tracer) is injected into your bloodstream. After the tracer has traveled to your heart, an imaging device is used to measure how much of the tracer is absorbed by or distributed to various areas of your heart. This procedure is usually done at a hospital and takes 2-4 hours. Tell a health care provider about:  Any allergies you have.  All medicines you are taking, including vitamins, herbs, eye drops, creams, and over-the-counter medicines.  Any problems you or family members have had with anesthetic medicines.  Any blood disorders you have.  Any surgeries you have had.  Any medical conditions you have.  Whether you are pregnant or may be pregnant. What are the risks? Generally, this is a safe procedure. However, problems may occur, including:  Serious chest pain and heart attack. This is only a risk if the stress portion of the test is done.  Rapid heartbeat.  Sensation of warmth in your chest. This usually passes quickly.  Allergic reaction to the tracer. What happens before the procedure?  Ask your health care provider about changing or stopping  your regular medicines. This is especially important if you are taking diabetes medicines or blood thinners.  Follow instructions from your health care provider about eating or drinking restrictions.  Remove your jewelry on the day of the procedure. What happens during the procedure?  An IV will be inserted into one of your veins.  Your health care provider will inject a small amount of radioactive tracer through the IV.  You will wait for 20-40 minutes while the tracer travels through your bloodstream.  Your heart activity will be monitored with an electrocardiogram (ECG).  You will lie down on an exam table.  Images of your heart will be taken for about 15-20 minutes.  You may also have a stress test. For this test, one of the following may be done: ? You will exercise on a treadmill or stationary bike. While you exercise, your heart's activity will be monitored with an ECG, and your blood pressure will be checked. ? You will be given medicines that will increase blood flow to parts of your heart. This is done if  you are unable to exercise.  When blood flow to your heart has peaked, a tracer will again be injected through the IV.  After 20-40 minutes, you will get back on the exam table and have more images taken of your heart.  Depending on the type of tracer used, scans may need to be repeated 3-4 hours later.  Your IV line will be removed when the procedure is over. The procedure may vary among health care providers and hospitals. What happens after the procedure?  Unless your health care provider tells you otherwise, you may return to your normal schedule, including diet, activities, and medicines.  Unless your health care provider tells you otherwise, you may increase your fluid intake. This will help to flush the contrast dye from your body. Drink enough fluid to keep your urine pale yellow.  Ask your health care provider, or the department that is doing the test: ? When  will my results be ready? ? How will I get my results? Summary  A cardiac nuclear scan measures the blood flow to the heart when a person is resting and when he or she is exercising.  Tell your health care provider if you are pregnant.  Before the procedure, ask your health care provider about changing or stopping your regular medicines. This is especially important if you are taking diabetes medicines or blood thinners.  After the procedure, unless your health care provider tells you otherwise, increase your fluid intake. This will help flush the contrast dye from your body.  After the procedure, unless your health care provider tells you otherwise, you may return to your normal schedule, including diet, activities, and medicines. This information is not intended to replace advice given to you by your health care provider. Make sure you discuss any questions you have with your health care provider. Document Revised: 03/08/2018 Document Reviewed: 03/08/2018 Elsevier Patient Education  [place].

## 2020-04-18 NOTE — Telephone Encounter (Signed)
Carla Bush, MD  Carla Lyons, Carla Ivory, RN Please let [name] know that her D-dimer is elevated. Given her unilateral leg swelling and worsening shortness of breath, PE is a concern. We will try to arrange for urgent CTA of the chest to exclude PE today through radiology. If this cannot be performed today, [name] will need to go to the ED for further evaluation. There is mild anemia, slightly worse compared with May but similar to levels a year ago. Renal function is stable.  -------------------------------------------------------------------------------------------------------------------------  Patient notified and verbalized understanding of the above results. She is agreeable to go back to the [facility] for Stat CTA of the chest to r/o PE. She is about 30 minutes away and will make her way back here. She has not eaten since before her appointment with Dr End and knows not to eat until after the CT.  Checked with Ciro Backer with precert and no precert is needed. CTA scheduled.

## 2020-04-18 NOTE — Progress Notes (Signed)
New Outpatient Visit Date: 04/18/2020  Referring Provider: Elby Beck, [place],  [place] [address]  Chief Complaint: Fatigue and shortness of breath  HPI:  Carla Lyons is a 79 y.o. female who is being seen today for urgent evaluation of bradycardia at the request of Carla Lyons. She has a history of hypertension, hyperlipidemia, TIA, chronic kidney disease stage III, hypothyroidism, GERD, anxiety disorder, and degenerative joint disease.  Carla Lyons was seen by her PCP today for routine follow-up.  It was noted that she was bradycardic with a heart rate of 50 bpm.  Carla Lyons endorsed increased fatigue recently.  She has also noticed progressive exertional dyspnea over the last 1 to 1.5 months.  She now says it is hard to breathe with even modest activity.  She has also noticed some leg swelling, left greater than right.  This has been intermittently present for several years but seems to be more pronounced over the last couple of weeks.  Carla Lyons denies lightheadedness, orthopnea, and PND.  Carla Lyons noted some transient right-sided chest pain earlier today, which she believes was due to stress related to having to see [place] today.  She otherwise denies having had chest pain.  She has intermittently experienced skipped beats without associated symptoms.  She has not had any further symptoms reminiscent of her TIA that occurred in May.  She was told that her symptoms may have represented a migraine.  She is scheduled for follow-up with neurology next week.  She notes that her blood pressure has been in the 135-145/70-80 range since returning home.  --------------------------------------------------------------------------------------------------  Cardiovascular History & Procedures: Cardiovascular Problems:  Bradycardia  TIA  Risk Factors:  TIA, hypertension, hyperlipidemia, and age greater than 61  Cath/PCI:  None  CV Surgery:  None  EP Procedures and  Devices:  None  Non-Invasive Evaluation(s):  TTE with bubble study (03/03/2020): Normal LV size with moderate LVH.  LVEF 60-65% with grade 1 diastolic dysfunction and elevated filling pressure.  Normal RV size and function.  Negative bubble study.  Recent CV Pertinent Labs: Lab Results  Component Value Date   CHOL 186 03/03/2020   HDL 61 03/03/2020   LDLCALC 117 (H) 03/03/2020   LDLDIRECT 167.0 02/26/2017   TRIG 42 03/03/2020   CHOLHDL 3.0 03/03/2020   INR 1.0 03/02/2020   K 3.9 04/18/2020   K 4.6 05/18/2013   MG 1.9 10/15/2011   BUN 27 (H) 04/18/2020   BUN 27 (A) 04/14/2019   BUN 25.9 05/18/2013   CREATININE 1.27 (H) 04/18/2020   CREATININE 1.6 (H) 05/18/2013    --------------------------------------------------------------------------------------------------  Past Medical History:  Diagnosis Date  . Allergy   . 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 Cataract    REMOVED  . 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 Colon polyps 06-15-07  . Diverticulosis    hx of  . Hyperlipidemia   . Hypertension   . Iron deficiency anemia due to chronic blood loss 12/06/2019  . Large hiatal hernia 12/06/2019  . Osteoporosis   . Personal history of colonic adenomas 07/19/2007  . Renal insufficiency   . Thyroid disease     Past Surgical History:  Procedure Laterality Date  . APPENDECTOMY    . CATARACT EXTRACTION W/ INTRAOCULAR LENS IMPLANT Right 01/08/2018  . CATARACT EXTRACTION W/ INTRAOCULAR LENS IMPLANT Left 02/05/2018  . CESAREAN SECTION     x 2  . COLONOSCOPY  11/17/2018  . ESOPHAGOGASTRODUODENOSCOPY  ENDOSCOPY    . PARTIAL HYSTERECTOMY  age 72  . VAGINAL HYSTERECTOMY  age 50   partial    Current Meds  Medication Sig  . ALPRAZolam (XANAX) 0.25 MG tablet Take 0.125 mg by mouth at bedtime as needed for anxiety.  Marland Kitchen aspirin (ADULT ASPIRIN EC LOW STRENGTH) 81 MG EC tablet Take 81 mg by mouth daily.   Marland Kitchen atorvastatin (LIPITOR) 20 MG tablet  Take 20 mg by mouth daily.  Marland Kitchen b complex vitamins tablet Take 1 tablet by mouth daily.  . benazepril (LOTENSIN) 10 MG tablet Take 10 mg by mouth daily.  Marland Kitchen BIOTIN 5000 PO Take 10,000 mcg by mouth daily.  . butalbital-acetaminophen-caffeine (FIORICET) 50-325-40 MG tablet Take 1-2 tablets by mouth every 6 (six) hours as needed for headache. Do not take more than 2 times per week given possibility of rebound headaches.  . cholecalciferol (VITAMIN D) 1000 UNITS tablet Take 1,000 Units by mouth daily.   . Cyanocobalamin 1000 MCG SUBL Place 1 tablet under the tongue daily.  . ferrous sulfate 325 (65 FE) MG tablet Take 325 mg by mouth at bedtime.  . furosemide (LASIX) 20 MG tablet Take 20 mg by mouth as needed for edema.  . hydrochlorothiazide (HYDRODIURIL) 12.5 MG tablet Take 12.5 mg by mouth daily.   Marland Kitchen levothyroxine (SYNTHROID) 88 MCG tablet Take 1 tablet (88 mcg total) by mouth daily before breakfast.  . Melatonin 3 MG TABS Take 3 mg by mouth at bedtime as needed (for sleep).   . Omega-3 Fatty Acids (FISH OIL) 1000 MG CAPS Take 1,000 mg by mouth in the morning and at bedtime.     Allergies: Fosamax [alendronate sodium], Niacin and related, and Codeine phosphate  Social History   Tobacco Use  . Smoking status: Former Smoker    Types: Cigarettes    Quit date: 10/07/2001    Years since quitting: 18.5  . Smokeless tobacco: Never Used  Vaping Use  . Vaping Use: Never used  Substance Use Topics  . Alcohol use: Not Currently  . Drug use: No    Family History  Problem Relation Age of Onset  . Coronary artery disease Mother   . Other Mother        bypass surgery  . Dementia Mother   . Osteoarthritis Father   . Heart disease Father   . Heart attack Maternal Grandfather   . Lung disease Brother   . Hypertension Brother   . Lung disease Sister   . Lung disease Sister   . Colon cancer Neg Hx   . Esophageal cancer Neg Hx   . Rectal cancer Neg Hx   . Stomach cancer Neg Hx   . Colon polyps  Neg Hx     Review of Systems: A 12-system review of systems was performed and was negative except as noted in the HPI.  --------------------------------------------------------------------------------------------------  Physical Exam: BP (!) 158/82 (BP Location: Left Arm, Patient Position: Sitting, Cuff Size: Large)   Pulse (!) 47   Ht 5\' 3"  (1.6 m)   Wt 166 lb 2 oz (75.4 kg)   SpO2 95%   BMI 29.43 kg/m    I personally walked Carla Lyons in the office with heart rate increasing to 91 bpm.  General: NAD. HEENT: No conjunctival pallor or scleral icterus. Facemask in place. Neck: Supple without lymphadenopathy, thyromegaly, JVD, or HJR. No carotid bruit. Lungs: Normal work of breathing. Clear to auscultation bilaterally without wheezes or crackles. Heart: Bradycardic but regular without murmurs, rubs,  or gallops. Abd: Bowel sounds present. Soft, NT/ND without hepatosplenomegaly Ext: Trace right and 1+ left calf edema.  2+ radial and pedal pulses bilaterally. Skin: Warm and dry without rash. Neuro: CNIII-XII intact. Strength and fine-touch sensation intact in upper and lower extremities bilaterally. Psych: Normal mood and affect.  EKG: Sinus bradycardia (ventricular rate 47 bpm) with low voltage.  Otherwise, no significant abnormality.  Lab Results  Component Value Date   WBC 7.2 04/18/2020   HGB 11.6 (L) 04/18/2020   HCT 35.6 (L) 04/18/2020   MCV 99.4 04/18/2020   PLT 188 04/18/2020    Lab Results  Component Value Date   NA 143 04/18/2020   K 3.9 04/18/2020   CL 109 04/18/2020   CO2 26 04/18/2020   BUN 27 (H) 04/18/2020   CREATININE 1.27 (H) 04/18/2020   GLUCOSE 107 (H) 04/18/2020   ALT 21 03/02/2020    Lab Results  Component Value Date   CHOL 186 03/03/2020   HDL 61 03/03/2020   LDLCALC 117 (H) 03/03/2020   LDLDIRECT 167.0 02/26/2017   TRIG 42 03/03/2020   CHOLHDL 3.0 03/03/2020      --------------------------------------------------------------------------------------------------  ASSESSMENT AND PLAN: Sinus bradycardia: Patient noted to have sinus bradycardia today with appropriate chronotropic response to ambulation.  Review of her chart is notable for sinus bradycardia dating back over a decade.  For instance, oldest EKG in our system from 05/16/2017 demonstrates sinus bradycardia with a heart rate of 44 bpm.  It is difficult to know if fatigue and dyspnea are related to her bradycardia, though I suspect they are not directly related given that bradycardia has been a longstanding.  I will check a TSH today.  No further intervention for bradycardia is planned; there is no indication for pacemaker placement at this time.  Shortness of breath, calf swelling, and chest pain: Constellation of symptoms is concerning for DVT/PE, given asymmetric leg edema as well as hospitalization within the last few months for her TIA.  I have recommended that we check a D-dimer today.  If it is elevated, will need to obtain an urgent CTA of the chest to exclude PE.  Assuming that PE work-up is unremarkable, we will move forward with myocardial perfusion stress testing to exclude significant ischemia contributing to her symptoms.  Of note, echocardiogram in 02/2020 showed normal LVEF with moderate LVH and grade 1 diastolic dysfunction.  Hypertension: Blood pressure suboptimally controlled today.  Defer medication changes pending aforementioned work-up.  TIA: Continue aspirin and atorvastatin.  Follow-up with neurology as scheduled.  Follow-up: Return to clinic in 1 month.  Nelva Bush, MD 04/18/2020 4:30 PM

## 2020-04-18 NOTE — Progress Notes (Signed)
Subjective:    Patient ID: Carla Lyons, female    DOB: Oct 06, 1941, 79 y.o.   MRN: [id number]  HPI Chief Complaint  Patient presents with  . Follow-up   This is a 79 yo female who presents for follow-up of chronic medical conditions.  She was seen last month with a cellulitis of her leg and tick bite.  Symptoms resolved with antibiotic.  02/23/2020-was seen in ER with some symptoms concerning for TIA.  She has not had any further symptoms.  Has neurology appointment next week.  Iron deficiency anemia with AVM-has been stable on oral iron, last CBC 03/02/2020  SOB- Intermittent dyspnea, relieved with rest, lasts a couple of minutes. Sleeping on one pillow.   She has a lot of yard maintenance and gardening that has to be done.  She avoids the heat.  She denies chest pain, palpitations.  She has some chronic left lower extremity swelling, it was a little worse x 1 day and she had some fluid on the skin. No chest pain, no cough, no wheeze.   Mood-has improved with reduction of Covid pandemic restrictions.  Weight gain- not making good choices, eats out of boredom.    Review of Systems Per HPI    Objective:   Physical Exam Vitals reviewed.  Constitutional:      General: She is not in acute distress.    Appearance: Normal appearance. She is obese. She is not ill-appearing, toxic-appearing or diaphoretic.  HENT:     Head: Normocephalic and atraumatic.  Cardiovascular:     Rate and Rhythm: Regular rhythm. Bradycardia present.     Pulses: Normal pulses.     Heart sounds: Normal heart sounds.  Pulmonary:     Effort: Pulmonary effort is normal.     Breath sounds: Normal breath sounds.  Musculoskeletal:     Right lower leg: No edema.     Left lower leg: Edema (trace) present.  Skin:    General: Skin is warm and dry.  Neurological:     Mental Status: She is alert and oriented to person, place, and time.  Psychiatric:        Mood and Affect: Mood normal.        Behavior: Behavior  normal.        Thought Content: Thought content normal.        Judgment: Judgment normal.      BP (!) 158/82   Pulse (!) 50   Temp 98.3 F (36.8 C)   Ht 5\' 3"  (1.6 m)   Wt 168 lb (76.2 kg)   SpO2 100%   BMI 29.76 kg/m  Wt Readings from Last 3 Encounters:  04/18/20 168 lb (76.2 kg)  03/07/20 161 lb (73 kg)  03/02/20 164 lb 11.2 oz (74.7 kg)   BP Readings from Last 3 Encounters:  04/18/20 (!) 158/82  03/07/20 (!) 142/82  03/04/20 (!) 178/75   EKG- sinus bradycardia, rate 47, non specific t wave abnormality     Assessment & Plan:  1. Bradycardia - able to get patient urgent appointment with cardiology for later today - EKG 12-Lead - Ambulatory referral to Cardiology  2. SOB (shortness of breath) - Ambulatory referral to Cardiology  This visit occurred during the SARS-CoV-2 public health emergency.  Safety protocols were in place, including screening questions prior to the visit, additional usage of staff PPE, and extensive cleaning of exam room while observing appropriate contact time as indicated for disinfecting solutions.    Clarene Reamer, FNP-BC  [facility]  04/18/2020 5:54 PM

## 2020-04-18 NOTE — Patient Instructions (Signed)
Good to see you today  Please schedule a follow up in 3 months
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
## 2020-04-25 ENCOUNTER — Other Ambulatory Visit: Payer: Self-pay

## 2020-04-25 ENCOUNTER — Encounter: Payer: Self-pay | Admitting: Neurology

## 2020-04-25 ENCOUNTER — Ambulatory Visit (INDEPENDENT_AMBULATORY_CARE_PROVIDER_SITE_OTHER): Payer: Medicare Other | Admitting: Neurology

## 2020-04-25 VITALS — BP 144/76 | HR 65 | Ht 63.0 in | Wt 160.0 lb

## 2020-04-25 DIAGNOSIS — G459 Transient cerebral ischemic attack, unspecified: Secondary | ICD-10-CM | POA: Diagnosis not present

## 2020-04-25 MED ORDER — ATORVASTATIN CALCIUM 40 MG PO TABS: 40.0000 mg | ORAL_TABLET | Freq: Every day | ORAL | 6 refills | Status: DC

## 2020-04-25 MED ORDER — CLOPIDOGREL BISULFATE 75 MG PO TABS: 75.0000 mg | ORAL_TABLET | Freq: Every day | ORAL | 11 refills | Status: DC

## 2020-04-25 NOTE — Patient Instructions (Addendum)
Stop ASA and start Plavix for stroke prevention I will contact cardiology and recommend heart monitor for several weeks and consideration of loop recorder in the future LDL 117 (goal < 70) increase Lipitor HgbA1c 6.4 - this is prediabetes please discuss with your primary care EEG of the brain  Transient Ischemic Attack  A transient ischemic attack (TIA) is a "warning stroke" that causes stroke-like symptoms that go away quickly. A TIA does not cause lasting damage to the brain. But having a TIA is a sign that you may be at risk for a stroke. Lifestyle changes and medical treatments can help prevent a stroke. It is important to know the symptoms of a TIA and what to do. Get help right away, even if your symptoms go away. The symptoms of a TIA are the same as those of a stroke. They can happen fast, and they usually go away within minutes or hours. They can include:  Weakness or loss of feeling in your face, arm, or leg. This often happens on one side of your body.  Trouble walking.  Trouble moving your arms or legs.  Trouble talking or understanding what people are saying.  Trouble seeing.  Seeing two of one object (double vision).  Feeling dizzy.  Feeling confused.  Loss of balance or coordination.  Feeling sick to your stomach (nauseous) and throwing up (vomiting).  A very bad headache for no reason. What increases the risk? Certain things may make you more likely to have a TIA. Some of these are things that you can change, such as:  Being very overweight (obese).  Using products that contain nicotine or tobacco, such as cigarettes and e-cigarettes.  Taking birth control pills.  Not being active.  Drinking too much alcohol.  Using drugs. Other risk factors include:  Having an irregular heartbeat (atrial fibrillation).  Being African American or Hispanic.  Having had blood clots, stroke, TIA, or heart attack in the past.  Being a woman with a history of high  blood pressure in pregnancy (preeclampsia).  Being over the age of 75.  Being female.  Having family history of stroke.  Having the following diseases or conditions: ? High blood pressure. ? High cholesterol. ? Diabetes. ? Heart disease. ? Sickle cell disease. ? Sleep apnea. ? Migraine headache. ? Long-term (chronic) diseases that cause soreness and swelling (inflammation). ? Disorders that affect how your blood clots. Follow these instructions at home: Medicines   Take over-the-counter and prescription medicines only as told by your doctor.  If you were told to take aspirin or another medicine to thin your blood, take it exactly as told by your doctor. ? Taking too much of the medicine can cause bleeding. ? Taking too little of the medicine may not work to treat the problem. Eating and drinking   Eat 5 or more servings of fruits and vegetables each day.  Follow instructions from your doctor about your diet. You may need to follow a certain diet to help lower your risk of having a stroke. You may need to: ? Eat a diet that is low in fat and salt. ? Eat foods that contain a lot of fiber. ? Limit the amount of carbohydrates and sugar in your diet.  Limit alcohol intake to 1 drink a day for nonpregnant women and 2 drinks a day for men. One drink equals 12 oz of beer, 5 oz of wine, or 1 oz of hard liquor. General instructions  Keep a healthy weight.  Stay  active. Try to get at least 30 minutes of activity on all or most days.  Find out if you have a condition called sleep apnea. Get treatment if needed.  Do not use any products that contain nicotine or tobacco, such as cigarettes and e-cigarettes. If you need help quitting, ask your doctor.  Do not abuse drugs.  Keep all follow-up visits as told by your doctor. This is important. Get help right away if:  You have any signs of stroke. "BE FAST" is an easy way to remember the main warning signs: ? B - Balance. Signs are  dizziness, sudden trouble walking, or loss of balance. ? E - Eyes. Signs are trouble seeing or a sudden change in how you see. ? F - Face. Signs are sudden weakness or loss of feeling of the face, or the face or eyelid drooping on one side. ? A - Arms. Signs are weakness or loss of feeling in an arm. This happens suddenly and usually on one side of the body. ? S - Speech. Signs are sudden trouble speaking, slurred speech, or trouble understanding what people say. ? T - Time. Time to call emergency services. Write down what time symptoms started.  You have other signs of stroke, such as: ? A sudden, very bad headache with no known cause. ? Feeling sick to your stomach (nausea). ? Throwing up (vomiting). ? Jerky movements that you cannot control (seizure). These symptoms may be an emergency. Do not wait to see if the symptoms will go away. Get medical help right away. Call your local emergency services (911 in the [place] Do not drive yourself to the hospital. Summary  A transient ischemic attack (TIA) is a "warning stroke" that causes stroke-like symptoms that go away quickly.  A TIA is a medical emergency. Get help right away, even if your symptoms go away.  A TIA does not cause lasting damage to the brain.  Having a TIA is a sign that you may be at risk for a stroke. Lifestyle changes and medical treatments can help prevent a stroke. This information is not intended to replace advice given to you by your health care provider. Make sure you discuss any questions you have with your health care provider. Document Revised: 06/18/2018 Document Reviewed: 12/24/2016 Elsevier Patient Education  [place].  Atrial Fibrillation  Atrial fibrillation is a type of irregular or rapid heartbeat (arrhythmia). In atrial fibrillation, the top part of the heart (atria) beats in an irregular pattern. This makes the heart unable to pump blood normally and effectively. The goal of treatment is to  prevent blood clots from forming, control your heart rate, or restore your heartbeat to a normal rhythm. If this condition is not treated, it can cause serious problems, such as a weakened heart muscle (cardiomyopathy) or a stroke. What are the causes? This condition is often caused by medical conditions that damage the heart's electrical system. These include:  High blood pressure (hypertension). This is the most common cause.  Certain heart problems or conditions, such as heart failure, coronary artery disease, heart valve problems, or heart surgery.  Diabetes.  Overactive thyroid (hyperthyroidism).  Obesity.  Chronic kidney disease. In some cases, the cause of this condition is not known. What increases the risk? This condition is more likely to develop in:  Older people.  People who smoke.  Athletes who do endurance exercise.  People who have a family history of atrial fibrillation.  Men.  People who use drugs.  People who drink a lot of alcohol.  People who have lung conditions, such as emphysema, pneumonia, or COPD.  People who have obstructive sleep apnea. What are the signs or symptoms? Symptoms of this condition include:  A feeling that your heart is racing or beating irregularly.  Discomfort or pain in your chest.  Shortness of breath.  Sudden light-headedness or weakness.  Tiring easily during exercise or activity.  Fatigue.  Syncope (fainting).  Sweating. In some cases, there are no symptoms. How is this diagnosed? Your health care provider may detect atrial fibrillation when taking your pulse. If detected, this condition may be diagnosed with:  An electrocardiogram (ECG) to check electrical signals of the heart.  An ambulatory cardiac monitor to record your heart's activity for a few days.  A transthoracic echocardiogram (TTE) to create pictures of your heart.  A transesophageal echocardiogram (TEE) to create even closer pictures of your  heart.  A stress test to check your blood supply while you exercise.  Imaging tests, such as a CT scan or chest X-ray.  Blood tests. How is this treated? Treatment depends on underlying conditions and how you feel when you experience atrial fibrillation. This condition may be treated with:  Medicines to prevent blood clots or to treat heart rate or heart rhythm problems.  Electrical cardioversion to reset the heart's rhythm.  A pacemaker to correct abnormal heart rhythm.  Ablation to remove the heart tissue that sends abnormal signals.  Left atrial appendage closure to seal the area where blood clots can form. In some cases, underlying conditions will be treated. Follow these instructions at home: Medicines  Take over-the counter and prescription medicines only as told by your health care provider.  Do not take any new medicines without talking to your health care provider.  If you are taking blood thinners: ? Talk with your health care provider before you take any medicines that contain aspirin or NSAIDs, such as ibuprofen. These medicines increase your risk for dangerous bleeding. ? Take your medicine exactly as told, at the same time every day. ? Avoid activities that could cause injury or bruising, and follow instructions about how to prevent falls. ? Wear a medical alert bracelet or carry a card that lists what medicines you take. Lifestyle      Do not use any products that contain nicotine or tobacco, such as cigarettes, e-cigarettes, and chewing tobacco. If you need help quitting, ask your health care provider.  Eat heart-healthy foods. Talk with a dietitian to make an eating plan that is right for you.  Exercise regularly as told by your health care provider.  Do not drink alcohol.  Lose weight if you are overweight.  Do not use drugs, including cannabis. General instructions  If you have obstructive sleep apnea, manage your condition as told by your health  care provider.  Do not use diet pills unless your health care provider approves. Diet pills can make heart problems worse.  Keep all follow-up visits as told by your health care provider. This is important. Contact a health care provider if you:  Notice a change in the rate, rhythm, or strength of your heartbeat.  Are taking a blood thinner and you notice more bruising.  Tire more easily when you exercise or do heavy work.  Have a sudden change in weight. Get help right away if you have:   Chest pain, abdominal pain, sweating, or weakness.  Trouble breathing.  Side effects of blood thinners, such as blood  in your vomit, stool, or urine, or bleeding that cannot stop.  Any symptoms of a stroke. "BE FAST" is an easy way to remember the main warning signs of a stroke: ? B - Balance. Signs are dizziness, sudden trouble walking, or loss of balance. ? E - Eyes. Signs are trouble seeing or a sudden change in vision. ? F - Face. Signs are sudden weakness or numbness of the face, or the face or eyelid drooping on one side. ? A - Arms. Signs are weakness or numbness in an arm. This happens suddenly and usually on one side of the body. ? S - Speech. Signs are sudden trouble speaking, slurred speech, or trouble understanding what people say. ? T - Time. Time to call emergency services. Write down what time symptoms started.  Other signs of a stroke, such as: ? A sudden, severe headache with no known cause. ? Nausea or vomiting. ? Seizure. These symptoms may represent a serious problem that is an emergency. Do not wait to see if the symptoms will go away. Get medical help right away. Call your local emergency services (911 in the [place] Do not drive yourself to the hospital. Summary  Atrial fibrillation is a type of irregular or rapid heartbeat (arrhythmia).  Symptoms include a feeling that your heart is beating fast or irregularly.  You may be given medicines to prevent blood clots or to  treat heart rate or heart rhythm problems.  Get help right away if you have signs or symptoms of a stroke.  Get help right away if you cannot catch your breath or have chest pain or pressure. This information is not intended to replace advice given to you by your health care provider. Make sure you discuss any questions you have with your health care provider. Document Revised: 03/16/2019 Document Reviewed: 03/16/2019 Elsevier Patient Education  [place].   Diabetes Basics  Diabetes (diabetes mellitus) is a long-term (chronic) disease. It occurs when the body does not properly use sugar (glucose) that is released from food after you eat. Diabetes may be caused by one or both of these problems:  Your pancreas does not make enough of a hormone called insulin.  Your body does not react in a normal way to insulin that it makes. Insulin lets sugars (glucose) go into cells in your body. This gives you energy. If you have diabetes, sugars cannot get into cells. This causes high blood sugar (hyperglycemia). Follow these instructions at home: How is diabetes treated? You may need to take insulin or other diabetes medicines daily to keep your blood sugar in balance. Take your diabetes medicines every day as told by your doctor. List your diabetes medicines here: Diabetes medicines  Name of medicine: ______________________________ ? Amount (dose): _______________ Time (a.m./p.m.): _______________ Notes: ___________________________________  Name of medicine: ______________________________ ? Amount (dose): _______________ Time (a.m./p.m.): _______________ Notes: ___________________________________  Name of medicine: ______________________________ ? Amount (dose): _______________ Time (a.m./p.m.): _______________ Notes: ___________________________________ If you use insulin, you will learn how to give yourself insulin by injection. You may need to adjust the amount based on the food that  you eat. List the types of insulin you use here: Insulin  Insulin type: ______________________________ ? Amount (dose): _______________ Time (a.m./p.m.): _______________ Notes: ___________________________________  Insulin type: ______________________________ ? Amount (dose): _______________ Time (a.m./p.m.): _______________ Notes: ___________________________________  Insulin type: ______________________________ ? Amount (dose): _______________ Time (a.m./p.m.): _______________ Notes: ___________________________________  Insulin type: ______________________________ ? Amount (dose): _______________ Time (a.m./p.m.): _______________ Notes: ___________________________________  Insulin  type: ______________________________ ? Amount (dose): _______________ Time (a.m./p.m.): _______________ Notes: ___________________________________ How do I manage my blood sugar?  Check your blood sugar levels using a blood glucose monitor as directed by your doctor. Your doctor will set treatment goals for you. Generally, you should have these blood sugar levels:  Before meals (preprandial): 80-130 mg/dL (4.4-7.2 mmol/L).  After meals (postprandial): below 180 mg/dL (10 mmol/L).  A1c level: less than 7%. Write down the times that you will check your blood sugar levels: Blood sugar checks  Time: _______________ Notes: ___________________________________  Time: _______________ Notes: ___________________________________  Time: _______________ Notes: ___________________________________  Time: _______________ Notes: ___________________________________  Time: _______________ Notes: ___________________________________  Time: _______________ Notes: ___________________________________  What do I need to know about low blood sugar? Low blood sugar is called hypoglycemia. This is when blood sugar is at or below 70 mg/dL (3.9 mmol/L). Symptoms may include:  Feeling: ? Hungry. ? Worried or nervous  (anxious). ? Sweaty and clammy. ? Confused. ? Dizzy. ? Sleepy. ? Sick to your stomach (nauseous).  Having: ? A fast heartbeat. ? A headache. ? A change in your vision. ? Tingling or no feeling (numbness) around the mouth, lips, or tongue. ? Jerky movements that you cannot control (seizure).  Having trouble with: ? Moving (coordination). ? Sleeping. ? Passing out (fainting). ? Getting upset easily (irritability). Treating low blood sugar To treat low blood sugar, eat or drink something sugary right away. If you can think clearly and swallow safely, follow the 15:15 rule:  Take 15 grams of a fast-acting carb (carbohydrate). Talk with your doctor about how much you should take.  Some fast-acting carbs are: ? Sugar tablets (glucose pills). Take 3-4 glucose pills. ? 6-8 pieces of hard candy. ? 4-6 oz (120-150 mL) of fruit juice. ? 4-6 oz (120-150 mL) of regular (not diet) soda. ? 1 Tbsp (15 mL) honey or sugar.  Check your blood sugar 15 minutes after you take the carb.  If your blood sugar is still at or below 70 mg/dL (3.9 mmol/L), take 15 grams of a carb again.  If your blood sugar does not go above 70 mg/dL (3.9 mmol/L) after 3 tries, get help right away.  After your blood sugar goes back to normal, eat a meal or a snack within 1 hour. Treating very low blood sugar If your blood sugar is at or below 54 mg/dL (3 mmol/L), you have very low blood sugar (severe hypoglycemia). This is an emergency. Do not wait to see if the symptoms will go away. Get medical help right away. Call your local emergency services (911 in the [place] Do not drive yourself to the hospital. Questions to ask your health care provider  Do I need to meet with a diabetes educator?  What equipment will I need to care for myself at home?  What diabetes medicines do I need? When should I take them?  How often do I need to check my blood sugar?  What number can I call if I have questions?  When is my  next doctor's visit?  Where can I find a support group for people with diabetes? Where to find more information  American Diabetes Association: www.diabetes.org  American Association of Diabetes Educators: www.diabeteseducator.org/patient-resources Contact a doctor if:  Your blood sugar is at or above 240 mg/dL (13.3 mmol/L) for 2 days in a row.  You have been sick or have had a fever for 2 days or more, and you are not getting better.  You have  any of these problems for more than 6 hours: ? You cannot eat or drink. ? You feel sick to your stomach (nauseous). ? You throw up (vomit). ? You have watery poop (diarrhea). Get help right away if:  Your blood sugar is lower than 54 mg/dL (3 mmol/L).  You get confused.  You have trouble: ? Thinking clearly. ? Breathing. Summary  Diabetes (diabetes mellitus) is a long-term (chronic) disease. It occurs when the body does not properly use sugar (glucose) that is released from food after digestion.  Take insulin and diabetes medicines as told.  Check your blood sugar every day, as often as told.  Keep all follow-up visits as told by your doctor. This is important. This information is not intended to replace advice given to you by your health care provider. Make sure you discuss any questions you have with your health care provider. Document Revised: 06/15/2019 Document Reviewed: 12/25/2017 Elsevier Patient Education  [place].

## 2020-05-03 ENCOUNTER — Encounter
Admission: RE | Admit: 2020-05-03 | Discharge: 2020-05-03 | Disposition: A | Discharge status: Home / Self Care | Payer: Medicare Other | Source: Ambulatory Visit | Attending: Internal Medicine | Admitting: Internal Medicine

## 2020-05-03 ENCOUNTER — Other Ambulatory Visit: Payer: Self-pay | Admitting: Family Medicine

## 2020-05-03 ENCOUNTER — Other Ambulatory Visit: Payer: Self-pay

## 2020-05-03 ENCOUNTER — Encounter: Payer: Self-pay | Admitting: Family Medicine

## 2020-05-03 DIAGNOSIS — R079 Chest pain, unspecified: Secondary | ICD-10-CM | POA: Insufficient documentation

## 2020-05-03 DIAGNOSIS — G4700 Insomnia, unspecified: Secondary | ICD-10-CM

## 2020-05-03 MED ORDER — REGADENOSON 0.4 MG/5ML IV SOLN
0.4000 mg | Freq: Once | INTRAVENOUS | Status: AC
  Administered 2020-05-03: 0.4 mg via INTRAVENOUS
  Filled 2020-05-03: qty 5

## 2020-05-03 MED ORDER — TECHNETIUM TC 99M TETROFOSMIN IV KIT
30.0000 | PACK | Freq: Once | INTRAVENOUS | Status: AC | PRN
  Administered 2020-05-03: 31.28 via INTRAVENOUS

## 2020-05-03 MED ORDER — TECHNETIUM TC 99M TETROFOSMIN IV KIT
10.0000 | PACK | Freq: Once | INTRAVENOUS | Status: AC | PRN
  Administered 2020-05-03: 10.301 via INTRAVENOUS

## 2020-05-03 NOTE — Telephone Encounter (Signed)
Pt is requesting a refill of Xanax - this has never been refilled by our office.   Upcoming appt With Family Medicine Elby Beck, FNP) 07/18/2020 at 9:00 AM

## 2020-05-04 LAB — NM MYOCAR MULTI W/SPECT W/WALL MOTION / EF
Estimated workload: 1 METS
Exercise duration (min): 0 min
Exercise duration (sec): 0 s
LV dias vol: 71 mL (ref 46–106)
LV sys vol: 19 mL
MPHR: 142 {beats}/min
Peak HR: 99 {beats}/min
Percent HR: 69 %
Rest HR: 48 {beats}/min
SDS: 0
SRS: 7
SSS: 1
TID: 0.8

## 2020-05-04 NOTE — Telephone Encounter (Signed)
Medication previously filled by me. Last refill two months ago.

## 2020-05-13 ENCOUNTER — Other Ambulatory Visit: Payer: Self-pay | Admitting: Family Medicine

## 2020-05-13 DIAGNOSIS — E785 Hyperlipidemia, unspecified: Secondary | ICD-10-CM

## 2020-05-21 ENCOUNTER — Encounter: Payer: Self-pay | Admitting: Family

## 2020-05-21 ENCOUNTER — Ambulatory Visit (INDEPENDENT_AMBULATORY_CARE_PROVIDER_SITE_OTHER): Payer: Medicare Other | Admitting: Family

## 2020-05-21 ENCOUNTER — Other Ambulatory Visit: Payer: Self-pay

## 2020-05-21 VITALS — BP 150/74 | HR 58 | Ht 63.0 in | Wt 161.0 lb

## 2020-05-21 DIAGNOSIS — R002 Palpitations: Secondary | ICD-10-CM | POA: Diagnosis not present

## 2020-05-21 DIAGNOSIS — I10 Essential (primary) hypertension: Secondary | ICD-10-CM

## 2020-05-21 DIAGNOSIS — E785 Hyperlipidemia, unspecified: Secondary | ICD-10-CM

## 2020-05-21 DIAGNOSIS — G459 Transient cerebral ischemic attack, unspecified: Secondary | ICD-10-CM | POA: Diagnosis not present

## 2020-05-21 DIAGNOSIS — R0609 Other forms of dyspnea: Secondary | ICD-10-CM

## 2020-05-21 DIAGNOSIS — R0600 Dyspnea, unspecified: Secondary | ICD-10-CM

## 2020-05-21 DIAGNOSIS — R001 Bradycardia, unspecified: Secondary | ICD-10-CM

## 2020-05-21 NOTE — Patient Instructions (Addendum)
Medication Instructions:  No medication changes today.  *If you need a refill on your cardiac medications before your next appointment, please call your pharmacy*   Lab Work: No lab work today.  Testing/Procedures: Your stress test was low risk and showed now significant blockages.  A zio monitor appt needs to be made after patient speaks to neurologist. It will remain on for 14 days. You will then return monitor and event diary in provided box. It takes 1-2 weeks for report to be downloaded and returned to [place]. We will call you with the results. If monitor falls of or has orange flashing light, please call Zio for further instructions.   Then, you will place second monitor at home and repeat process.  (When she comes in for monitor placement, please give additional monitor. She will be wearing a total of 4 weeks. Mail does not run at her house for [place]).    Follow-Up: At [facility], you and your health needs are our priority.  As part of our continuing mission to provide you with exceptional heart care, we have created designated Provider Care Teams.  These Care Teams include your primary Cardiologist (physician) and Advanced Practice Providers (APPs -  Physician Assistants and Nurse Practitioners) who all work together to provide you with the care you need, when you need it.  We recommend signing up for the patient portal called "MyChart".  Sign up information is provided on this After Visit Summary.  MyChart is used to connect with patients for Virtual Visits (Telemedicine).  Patients are able to view lab/test results, encounter notes, upcoming appointments, etc.  Non-urgent messages can be sent to your provider as well.   To learn more about what you can do with MyChart, go to [email or web address].    Your next appointment:   1- 1 week lab appt for monitor placement  2- 6 weeks with Dr. Saunders Revel or APP  Other Instructions  Please discuss your blood pressure medication with Dr.  Abigail Butts. If he prefers our office to manage your blood pressure, let us know. Our goal is for your blood pressure to be less than 130/80.   Check on a medication called Hydrochlorothiazide (HCTZ) and Furosemide (Lasix) at home to let us know how you are taking them.   We will plan to see you back next week to place the heart monitor. We will send a message to your neurologist to discuss as well. She recommended this be placed to rule out atrial fibrillation as contributing to your TIA.

## 2020-05-21 NOTE — Progress Notes (Signed)
Office Visit    Patient Name: Carla Lyons Date of Encounter: 05/21/2020  Primary Care Provider:  Elby Beck, Aurelia Primary Cardiologist:  No primary care provider on file. Electrophysiologist:  None   Chief Complaint    Carla Lyons is a 79 y.o. female with a hx of bradycardia, HTN, HLD, TIA, CKDIII, hypothyroidism, GERD, anxiety, degenerative joint disease presents today for follow-up after stress test  Past Medical History    Past Medical History:  Diagnosis Date  . Allergy   . 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 Cataract    REMOVED  . 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 Colon polyps 06-15-07  . Diverticulosis    hx of  . Hyperlipidemia   . Hypertension   . Iron deficiency anemia due to chronic blood loss 12/06/2019  . Large hiatal hernia 12/06/2019  . Osteoporosis   . Personal history of colonic adenomas 07/19/2007  . Renal insufficiency   . Thyroid disease   . TIA (transient ischemic attack)    Past Surgical History:  Procedure Laterality Date  . APPENDECTOMY    . CATARACT EXTRACTION W/ INTRAOCULAR LENS IMPLANT Right 01/08/2018  . CATARACT EXTRACTION W/ INTRAOCULAR LENS IMPLANT Left 02/05/2018  . CESAREAN SECTION     x 2  . COLONOSCOPY  11/17/2018  . ESOPHAGOGASTRODUODENOSCOPY ENDOSCOPY    . PARTIAL HYSTERECTOMY  age 7  . VAGINAL HYSTERECTOMY  age 78   partial    Allergies  Allergies  Allergen Reactions  . Fosamax [Alendronate Sodium]     Red and hot all over  . Niacin And Related     Rash and red all over  . Codeine Phosphate     REACTION: Nausea/vomiting    History of Present Illness    Carla Lyons is a 79 y.o. female with a hx of  bradycardia, HTN, HLD, TIA, CKDIII, hypothyroidism, GERD, anxiety, degenerative joint disease. She was last seen 04/18/20 by Dr. Saunders Revel.  She was hospitalized in May for possible TIA.TTE with bubble study 03/03/20 showed LVEF 60-65%, gr1DD, negative bubble study.   She was seen  in consultation for bradycardia with HR 50 bpm. Noted progressive exertional dyspnea over the prior 1-1.5 months as well as intermittent LE edema. She had a fleeting episode of chest pain which she attributed to stress over her cardiology appointment. She walked int he office and had appropriate chronotropic response to ambulation. Her bradycardia was noted to be longstanding dating back to 05/2017. She was recommended for d-dimer due to concern for PE/DVT. Her d-dimer was positive with subsequent CTA 04/18/20 without PE.   Seen by neurology 04/25/20. Her aspirin was changed to plavix. She was recommended for monitor.  She had a [place] Myoview 05/03/20 with no significant ischemia with CT attenuation images showing mild aortic atherosclerosis and moderate three-vessel coronary calcification.   She has had a twinge of chest discomfort when she is resting that feels like a "jump" that does not last. Not associated with shortness of breath. Reports no indigestion. Happens about once per week.   Reports she is short of breath mostly when she is moving. Reports this is about the same. Tells me this has been noted since early summer. Occurs intermittently. She has not been doing much which she attributes to some of her symptoms.   Labs 05/15/20 at nephrology:  Creatinine 1.36, GF 37, Hb 11  EKGs/Labs/Other Studies Reviewed:   The following studies were reviewed today:  Chest CTA  04/18/20 IMPRESSION: 1. No evidence of pulmonary embolism. 2. No acute findings in the thorax to account for the patient's symptoms. 3. Aortic atherosclerosis, in addition to left main and 3 vessel coronary artery disease. Assessment for potential risk factor modification, dietary therapy or pharmacologic therapy may be warranted, if clinically indicated. 4. Large hiatal hernia. 5. Mild diffuse bronchial wall thickening with mild centrilobular and paraseptal emphysema; imaging findings suggestive of underlying COPD.     Aortic Atherosclerosis (ICD10-I70.0) and Emphysema (ICD10-J43.9).  Lexiscan Myoview 05/03/20 Pharmacological myocardial perfusion imaging study with no significant  ischemia Normal wall motion, EF estimated at 78% No EKG changes concerning for ischemia at peak stress or in recovery. CT attenuation correction images with mild aortic atherosclerosis, moderate three-vessel coronary calcification Low risk scan  TTE with bubble study (03/03/2020): Normal LV size with moderate LVH. LVEF 60-65% with grade 1 diastolic dysfunction and elevated filling pressure. Normal RV size and function. Negative bubble study.   EKG: No EKG today.  Recent Labs: 03/02/2020: ALT 21 04/18/2020: BUN 27; Creatinine, Ser 1.27; Hemoglobin 11.6; Platelets 188; Potassium 3.9; Sodium 143; TSH 1.246  Recent Lipid Panel    Component Value Date/Time   CHOL 186 03/03/2020 0727   TRIG 42 03/03/2020 0727   HDL 61 03/03/2020 0727   CHOLHDL 3.0 03/03/2020 0727   VLDL 8 03/03/2020 0727   LDLCALC 117 (H) 03/03/2020 0727   LDLDIRECT 167.0 02/26/2017 1434    Home Medications   Current Meds  Medication Sig  . ALPRAZolam (XANAX) 0.25 MG tablet TAKE 1 TABLET (0.25 MG TOTAL) BY MOUTH AT BEDTIME.  Marland Kitchen atorvastatin (LIPITOR) 40 MG tablet Take 1 tablet (40 mg total) by mouth daily.  Marland Kitchen b complex vitamins tablet Take 1 tablet by mouth daily.  . benazepril (LOTENSIN) 10 MG tablet TAKE 1 TABLET BY MOUTH EVERY DAY (Patient taking differently: Take 10 mg by mouth every evening. )  . BIOTIN 5000 PO Take 10,000 mcg by mouth daily.  . butalbital-acetaminophen-caffeine (FIORICET) 50-325-40 MG tablet Take 1-2 tablets by mouth every 6 (six) hours as needed for headache. Do not take more than 2 times per week given possibility of rebound headaches.  . cholecalciferol (VITAMIN D) 1000 UNITS tablet Take 1,000 Units by mouth daily.   . clopidogrel (PLAVIX) 75 MG tablet Take 1 tablet (75 mg total) by mouth daily.  . Cyanocobalamin 1000 MCG SUBL  Place 1 tablet under the tongue daily.  . ferrous sulfate 325 (65 FE) MG tablet Take 325 mg by mouth at bedtime.  . furosemide (LASIX) 20 MG tablet Take 20 mg by mouth as needed for edema.  . hydrochlorothiazide (HYDRODIURIL) 12.5 MG tablet Take 12.5 mg by mouth daily.   Marland Kitchen levothyroxine (SYNTHROID) 88 MCG tablet Take 1 tablet (88 mcg total) by mouth daily before breakfast.  . Melatonin 3 MG TABS Take 3 mg by mouth at bedtime as needed (for sleep).   . Omega-3 Fatty Acids (FISH OIL) 1000 MG CAPS Take 1,000 mg by mouth in the morning and at bedtime.   Marland Kitchen omeprazole (PRILOSEC) 40 MG capsule Take 1 capsule (40 mg total) by mouth daily before breakfast.    Review of Systems    Review of Systems  Constitutional: Negative for chills, fever and malaise/fatigue.  Cardiovascular: Positive for dyspnea on exertion. Negative for chest pain, leg swelling, near-syncope, orthopnea, palpitations and syncope.  Respiratory: Negative for cough, shortness of breath and wheezing.   Gastrointestinal: Negative for nausea and vomiting.  Neurological: Negative for dizziness, light-headedness  and weakness.   All other systems reviewed and are otherwise negative except as noted above.  Physical Exam    VS:  BP (!) 150/74 (BP Location: Left Arm, Patient Position: Sitting, Cuff Size: Normal)   Pulse (!) 58   Ht 5\' 3"  (1.6 m)   Wt 161 lb (73 kg)   SpO2 90%   BMI 28.52 kg/m  , BMI Body mass index is 28.52 kg/m. GEN: Well nourished, well developed, in no acute distress. HEENT: normal. Neck: Supple, no JVD, carotid bruits, or masses. Cardiac: RRR, no murmurs, rubs, or gallops. No clubbing, cyanosis, edema.  Radials//PT 2+ and equal bilaterally.  Respiratory:  Respirations regular and unlabored, clear to auscultation bilaterally. GI: Soft, nontender, nondistended.. MS: No deformity or atrophy. Skin: Warm and dry, no rash. Neuro:  Strength and sensation are intact. Psych: Normal affect.  Assessment & Plan     1. ?TIA - Recent admission 02/2020 with probable TIA.  She has been following with neurology.  She has an EEG scheduled for Wednesday.  We reviewed indication for 4-week monitor in the setting of TIA with concern for atrial fibrillation.  However, she tells me she prefers to discuss with her neurologist first.  We reviewed that her neurologist was the one that sent the recommendation to Dr. Saunders Revel.  However, she was agreeable to be scheduled for an appointment next week to have monitor placed.  She did not want it mailed to her home as she tells me she has difficulty with her mailing.  As such, we will place 2 weeks ZIO XT at clinic next week and give her another monitor to place after 2 weeks.  Her neurologist changed her to Plavix and we reviewed the indication for this if she had questions.  2. Bradycardia - Longstanding history with appropriate chronotropic response.  No lightheadedness, dizziness, near-syncope, syncope.  Plan for ZIO monitor, as above.  3. LDL, LDL goal <70 - 03/03/20 LDL 117. Her neurologist increased her Atorvastatin to 40mg .  We reviewed the indication in depth.  She will need repeat lipid panel either by neurology, primary care, or ourselves in approximately 2 months.  4. HTN - BP mildly elevated including at home and in clinic today.  Discussed BP goal of less than 130/80.  She plans to discuss with Dr. Abigail Butts her nephrologist tomorrow.  She politely declines changes to her antihypertensive regimen today as Dr. Bradd Burner restarted her blood pressure medication and she would prefer to continue to follow with him.  5. DOE/Emphysema/Hiatal Hernia - Recent chest CTA with no PE, emphysema. Lexiscan myoview without evidence of ischemia. She was unaware of diagnosis of emphysema and encouraged to discuss with PCP. Likely multifactorial deconditioning, large hiatal hernia, emphysema.  Disposition: Appointment for monitor placement 05/29/20. Follow up in 6 week(s) with Dr. Saunders Revel or APP.    Loel Dubonnet, NP 05/21/2020, 10:55 AM

## 2020-05-22 ENCOUNTER — Other Ambulatory Visit: Payer: Self-pay

## 2020-05-22 ENCOUNTER — Encounter: Payer: Self-pay | Admitting: Emergency Medicine

## 2020-05-22 ENCOUNTER — Other Ambulatory Visit: Payer: Medicare Other

## 2020-05-22 ENCOUNTER — Emergency Department: Payer: Medicare Other

## 2020-05-22 DIAGNOSIS — R110 Nausea: Secondary | ICD-10-CM | POA: Diagnosis not present

## 2020-05-22 DIAGNOSIS — R0789 Other chest pain: Secondary | ICD-10-CM | POA: Diagnosis not present

## 2020-05-22 DIAGNOSIS — Z5321 Procedure and treatment not carried out due to patient leaving prior to being seen by health care provider: Secondary | ICD-10-CM | POA: Diagnosis not present

## 2020-05-22 LAB — BASIC METABOLIC PANEL
Anion gap: 8 (ref 5–15)
BUN: 42 mg/dL — ABNORMAL HIGH (ref 8–23)
CO2: 27 mmol/L (ref 22–32)
Calcium: 9.5 mg/dL (ref 8.9–10.3)
Chloride: 105 mmol/L (ref 98–111)
Creatinine, Ser: 1.53 mg/dL — ABNORMAL HIGH (ref 0.44–1.00)
GFR calc Af Amer: 37 mL/min — ABNORMAL LOW (ref >60)
GFR calc non Af Amer: 32 mL/min — ABNORMAL LOW (ref >60)
Glucose, Bld: 110 mg/dL — ABNORMAL HIGH (ref 70–99)
Potassium: 5.5 mmol/L — ABNORMAL HIGH (ref 3.5–5.1)
Sodium: 140 mmol/L (ref 135–145)

## 2020-05-22 LAB — CBC
HCT: 36.2 % (ref 36.0–46.0)
Hemoglobin: 11.7 g/dL — ABNORMAL LOW (ref 12.0–15.0)
MCH: 33.1 pg (ref 26.0–34.0)
MCHC: 32.3 g/dL (ref 30.0–36.0)
MCV: 102.5 fL — ABNORMAL HIGH (ref 80.0–100.0)
Platelets: 215 10*3/uL (ref 150–400)
RBC: 3.53 MIL/uL — ABNORMAL LOW (ref 3.87–5.11)
RDW: 12.4 % (ref 11.5–15.5)
WBC: 7.4 10*3/uL (ref 4.0–10.5)
nRBC: 0 % (ref 0.0–0.2)

## 2020-05-22 LAB — TROPONIN I (HIGH SENSITIVITY): Troponin I (High Sensitivity): 4 ng/L (ref <18)

## 2020-05-22 LAB — PROTIME-INR
INR: 1 (ref 0.8–1.2)
Prothrombin Time: 12.7 seconds (ref 11.4–15.2)

## 2020-05-22 NOTE — ED Triage Notes (Signed)
Pt to ED from home c/o mid chest pain started suddenly at 1630 intermittently, not doing anything specific at time of onset, pain radiates through to back, sharp aching pain with some nausea.  PT A&Ox4, chest rise unlabored and in NAD at this time.

## 2020-05-23 ENCOUNTER — Ambulatory Visit (INDEPENDENT_AMBULATORY_CARE_PROVIDER_SITE_OTHER): Payer: Medicare Other | Admitting: Neurology

## 2020-05-23 ENCOUNTER — Emergency Department
Admission: EM | Admit: 2020-05-23 | Discharge: 2020-05-23 | Disposition: A | Discharge status: Home / Self Care | Payer: Medicare Other | Attending: Emergency Medicine | Admitting: Emergency Medicine

## 2020-05-23 DIAGNOSIS — G459 Transient cerebral ischemic attack, unspecified: Secondary | ICD-10-CM

## 2020-05-23 DIAGNOSIS — R410 Disorientation, unspecified: Secondary | ICD-10-CM

## 2020-05-23 LAB — HEPATIC FUNCTION PANEL
ALT: 25 U/L (ref 0–44)
AST: 22 U/L (ref 15–41)
Albumin: 4.5 g/dL (ref 3.5–5.0)
Alkaline Phosphatase: 53 U/L (ref 38–126)
Bilirubin, Direct: <0.1 mg/dL (ref 0.0–0.2)
Total Bilirubin: 0.9 mg/dL (ref 0.3–1.2)
Total Protein: 7.5 g/dL (ref 6.5–8.1)

## 2020-05-23 LAB — LIPASE, BLOOD: Lipase: 51 U/L (ref 11–51)

## 2020-05-24 ENCOUNTER — Telehealth: Payer: Self-pay | Admitting: Emergency Medicine

## 2020-05-24 NOTE — Telephone Encounter (Signed)
Called patient due to lwot to inquire about condition and follow up plans.  No answer and no voicemail  

## 2020-05-24 NOTE — Procedures (Signed)
    History:  Carla Lyons is a 79 year old patient with a history of a recent emergency room visit with a headache and confusion.  Everything appeared to be too bright in her vision, and she felt confused.  She had a severe bifrontal headache.  The patient is being evaluated for this event.  This is a routine EEG.  No skull defects are noted.  Medications include Xanax, Lotensin, Fioricet, iron supplementation, Lasix, hydrochlorothiazide, Synthroid, melatonin, Prilosec, Lipitor, Plavix, and vitamin D.  EEG classification: Normal awake and asleep  Description of the recording: The background rhythms of this recording consists of a fairly well modulated medium amplitude background activity of 10 Hz. As the record progresses, the patient initially is in the waking state, but appears to enter the early stage II sleep during the recording, with rudimentary sleep spindles and vertex sharp wave activity seen. During the wakeful state, photic stimulation is performed, and this results in a bilateral and symmetric photic driving response. Hyperventilation was also performed, and this results in a minimal buildup of the background rhythm activities without significant slowing seen. At no time during the recording does there appear to be evidence of spike or spike wave discharges or evidence of focal slowing. EKG monitor shows no evidence of cardiac rhythm abnormalities with a heart rate of 42.  Impression: This is a normal EEG recording in the waking and sleeping state. No evidence of ictal or interictal discharges were seen at any time during the recording.  Please note significant bradycardia during the recording.

## 2020-05-29 ENCOUNTER — Ambulatory Visit (INDEPENDENT_AMBULATORY_CARE_PROVIDER_SITE_OTHER): Payer: Medicare Other

## 2020-05-29 ENCOUNTER — Other Ambulatory Visit: Payer: Self-pay

## 2020-05-29 DIAGNOSIS — G459 Transient cerebral ischemic attack, unspecified: Secondary | ICD-10-CM

## 2020-05-30 ENCOUNTER — Telehealth: Payer: Self-pay | Admitting: *Deleted

## 2020-05-30 NOTE — Telephone Encounter (Addendum)
I called the pt to discuss any concerns she has about the heart monitor. Pt aware Dr Jaynee Eagles does recommend she go ahead with the [facility] monitor which had been discussed by cardiology. Pt declined any concerns and she already picked up the first monitor yesterday that she will wear for 2 weeks, then will mail it back and pick up a second to wear for an additional 2 weeks. Pt verbalized appreciation for the call. She will follow up with our office on 08/13/20.

## 2020-06-12 ENCOUNTER — Other Ambulatory Visit: Payer: Self-pay

## 2020-06-12 ENCOUNTER — Ambulatory Visit (INDEPENDENT_AMBULATORY_CARE_PROVIDER_SITE_OTHER): Payer: Medicare Other

## 2020-06-12 DIAGNOSIS — G459 Transient cerebral ischemic attack, unspecified: Secondary | ICD-10-CM | POA: Diagnosis not present

## 2020-06-12 DIAGNOSIS — R001 Bradycardia, unspecified: Secondary | ICD-10-CM

## 2020-07-02 ENCOUNTER — Encounter: Payer: Self-pay | Admitting: Family

## 2020-07-02 ENCOUNTER — Ambulatory Visit (INDEPENDENT_AMBULATORY_CARE_PROVIDER_SITE_OTHER): Payer: Medicare Other | Admitting: Family

## 2020-07-02 ENCOUNTER — Other Ambulatory Visit: Payer: Self-pay

## 2020-07-02 VITALS — BP 148/80 | HR 78 | Ht 63.0 in | Wt 160.0 lb

## 2020-07-02 DIAGNOSIS — K449 Diaphragmatic hernia without obstruction or gangrene: Secondary | ICD-10-CM

## 2020-07-02 DIAGNOSIS — E785 Hyperlipidemia, unspecified: Secondary | ICD-10-CM | POA: Diagnosis not present

## 2020-07-02 DIAGNOSIS — G459 Transient cerebral ischemic attack, unspecified: Secondary | ICD-10-CM

## 2020-07-02 DIAGNOSIS — I10 Essential (primary) hypertension: Secondary | ICD-10-CM

## 2020-07-02 MED ORDER — AMLODIPINE BESYLATE 5 MG PO TABS
5.0000 mg | ORAL_TABLET | Freq: Every day | ORAL | 5 refills | Status: DC
Start: 2020-07-02 — End: 2020-10-25

## 2020-07-02 NOTE — Progress Notes (Signed)
Office Visit    Patient Name: Carla Lyons Date of Encounter: 07/02/2020  Primary Care Provider:  Elby Beck, FNP Primary Cardiologist:  Nelva Bush, MD Electrophysiologist:  None   Chief Complaint    Carla Lyons is a 79 y.o. female with a hx of bradycardia, HTN, HLD, TIA, CKDIII, hypothyroidism, GERD, anxiety, degenerative joint disease presents today for follow-up after stress test  Past Medical History    Past Medical History:  Diagnosis Date  . Allergy   . 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 Cataract    REMOVED  . 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 Colon polyps 06-15-07  . Diverticulosis    hx of  . Hyperlipidemia   . Hypertension   . Iron deficiency anemia due to chronic blood loss 12/06/2019  . Large hiatal hernia 12/06/2019  . Osteoporosis   . Personal history of colonic adenomas 07/19/2007  . Renal insufficiency   . Thyroid disease   . TIA (transient ischemic attack)    Past Surgical History:  Procedure Laterality Date  . APPENDECTOMY    . CATARACT EXTRACTION W/ INTRAOCULAR LENS IMPLANT Right 01/08/2018  . CATARACT EXTRACTION W/ INTRAOCULAR LENS IMPLANT Left 02/05/2018  . CESAREAN SECTION     x 2  . COLONOSCOPY  11/17/2018  . ESOPHAGOGASTRODUODENOSCOPY ENDOSCOPY    . PARTIAL HYSTERECTOMY  age 58  . VAGINAL HYSTERECTOMY  age 65   partial    Allergies  Allergies  Allergen Reactions  . Fosamax [Alendronate Sodium]     Red and hot all over  . Niacin And Related     Rash and red all over  . Codeine Phosphate     REACTION: Nausea/vomiting    History of Present Illness    Carla Lyons is a 79 y.o. female with a hx of  bradycardia, HTN, HLD, TIA, CKDIII, hypothyroidism, GERD, anxiety, degenerative joint disease. She was last seen 05/21/2020.  She was hospitalized in May for possible TIA.TTE with bubble study 03/03/20 showed LVEF 60-65%, gr1DD, negative bubble study.   She was seen in consultation for  bradycardia with HR 50 bpm. Noted progressive exertional dyspnea over the prior 1-1.5 months as well as intermittent LE edema. She had a fleeting episode of chest pain which she attributed to stress over her cardiology appointment. She walked int he office and had appropriate chronotropic response to ambulation. Her bradycardia was noted to be longstanding dating back to 05/2017. She was recommended for d-dimer due to concern for PE/DVT. Her d-dimer was positive with subsequent CTA 04/18/20 without PE.   Seen by neurology 04/25/20. Her aspirin was changed to plavix. She was recommended for telemetry monitor.  She had a [place] Myoview 05/03/20 with no significant ischemia with CT attenuation images showing mild aortic atherosclerosis and moderate three-vessel coronary calcification.   Seen in clinic 05/21/2020.  Reported no exertional chest pain.  Endorsed some dyspnea on exertion which she attributed to being less active than usual.  She was set up for 4 week ZIO monitor.  Labs 05/15/20 at nephrology:  Creatinine 1.36, GF 37, Hb 11  Her first ZIO monitor preliminary report reviewed.  Shows predominantly sinus rhythm.  Average heart rate 65 bpm.  She had 1 run of VT lasting 15 beats.  She had 34 runs of SVT longest 7 beats with rate of 197 bpm and longest 17 beats with rate 108 bpm.  She had rare (less than 1%) PVC/PAC.  She had no triggered  events.  The second monitor is being downloaded by the company.  Reports feeling overall well.  We did discuss her ED visit from 05/23/2020 which she left without being seen.  Tells me she had pain between her breast that went through to her back which self resolved.  Tells me this pain happens intermittently nothing seems to make it better nor worse.  It occurs predominantly at rest.  We discussed her moderate hiatal hernia and that it may be contributory.  The pain is not exertional and is not associated with shortness of breath nor diaphoresis.  Endorses continued  dyspnea on exertion.  No shortness of breath at rest.  Reports no chest pain, pressure, tightness.  Reports no palpitations, lightheadedness, dizziness.  She has noted no recurrent symptoms similar to her previous TIA.  We reviewed her preliminary report of her for first ZIO monitor.  She denies any awareness of rapid heart rates.  Her blood pressure has continued to be high at home.  EKGs/Labs/Other Studies Reviewed:   The following studies were reviewed today:  Chest CTA 04/18/20 IMPRESSION: 1. No evidence of pulmonary embolism. 2. No acute findings in the thorax to account for the patient's symptoms. 3. Aortic atherosclerosis, in addition to left main and 3 vessel coronary artery disease. Assessment for potential risk factor modification, dietary therapy or pharmacologic therapy may be warranted, if clinically indicated. 4. Large hiatal hernia. 5. Mild diffuse bronchial wall thickening with mild centrilobular and paraseptal emphysema; imaging findings suggestive of underlying COPD.   Aortic Atherosclerosis (ICD10-I70.0) and Emphysema (ICD10-J43.9).  Lexiscan Myoview 05/03/20 Pharmacological myocardial perfusion imaging study with no significant  ischemia Normal wall motion, EF estimated at 78% No EKG changes concerning for ischemia at peak stress or in recovery. CT attenuation correction images with mild aortic atherosclerosis, moderate three-vessel coronary calcification Low risk scan  TTE with bubble study (03/03/2020): Normal LV size with moderate LVH. LVEF 60-65% with grade 1 diastolic dysfunction and elevated filling pressure. Normal RV size and function. Negative bubble study.   EKG: No EKG today.  Recent Labs: 04/18/2020: TSH 1.246 05/22/2020: ALT 25; BUN 42; Creatinine, Ser 1.53; Hemoglobin 11.7; Platelets 215; Potassium 5.5; Sodium 140  Recent Lipid Panel    Component Value Date/Time   CHOL 186 03/03/2020 0727   TRIG 42 03/03/2020 0727   HDL 61 03/03/2020 0727    CHOLHDL 3.0 03/03/2020 0727   VLDL 8 03/03/2020 0727   LDLCALC 117 (H) 03/03/2020 0727   LDLDIRECT 167.0 02/26/2017 1434    Home Medications   Current Meds  Medication Sig  . ALPRAZolam (XANAX) 0.25 MG tablet TAKE 1 TABLET (0.25 MG TOTAL) BY MOUTH AT BEDTIME.  Marland Kitchen atorvastatin (LIPITOR) 40 MG tablet Take 1 tablet (40 mg total) by mouth daily.  Marland Kitchen b complex vitamins tablet Take 1 tablet by mouth daily.  . benazepril (LOTENSIN) 10 MG tablet TAKE 1 TABLET BY MOUTH EVERY DAY (Patient taking differently: Take 10 mg by mouth every evening. )  . BIOTIN 5000 PO Take 10,000 mcg by mouth daily.  . butalbital-acetaminophen-caffeine (FIORICET) 50-325-40 MG tablet Take 1-2 tablets by mouth every 6 (six) hours as needed for headache. Do not take more than 2 times per week given possibility of rebound headaches.  . cholecalciferol (VITAMIN D) 1000 UNITS tablet Take 1,000 Units by mouth daily.   . clopidogrel (PLAVIX) 75 MG tablet Take 1 tablet (75 mg total) by mouth daily.  . Cyanocobalamin 1000 MCG SUBL Place 1 tablet under the tongue daily.  Marland Kitchen  ferrous sulfate 325 (65 FE) MG tablet Take 325 mg by mouth at bedtime.  . furosemide (LASIX) 20 MG tablet Take 20 mg by mouth as needed for edema.  . hydrochlorothiazide (HYDRODIURIL) 12.5 MG tablet Take 12.5 mg by mouth daily.   Marland Kitchen levothyroxine (SYNTHROID) 88 MCG tablet Take 1 tablet (88 mcg total) by mouth daily before breakfast.  . Melatonin 3 MG TABS Take 3 mg by mouth at bedtime as needed (for sleep).   . Omega-3 Fatty Acids (FISH OIL) 1000 MG CAPS Take 1,000 mg by mouth in the morning and at bedtime.   Marland Kitchen omeprazole (PRILOSEC) 40 MG capsule Take 1 capsule (40 mg total) by mouth daily before breakfast.    Review of Systems   Review of Systems  Constitutional: Negative for chills, fever and malaise/fatigue.  Cardiovascular: Positive for dyspnea on exertion. Negative for chest pain, leg swelling, near-syncope, orthopnea, palpitations and syncope.    Respiratory: Negative for cough, shortness of breath and wheezing.   Gastrointestinal: Negative for nausea and vomiting.  Neurological: Negative for dizziness, light-headedness and weakness.   All other systems reviewed and are otherwise negative except as noted above.  Physical Exam    VS:  BP (!) 148/80 (BP Location: Left Arm, Patient Position: Sitting, Cuff Size: Normal)   Pulse 78   Ht 5\' 3"  (1.6 m)   Wt 160 lb (72.6 kg)   SpO2 92%   BMI 28.34 kg/m  , BMI Body mass index is 28.34 kg/m. GEN: Well nourished, well developed, in no acute distress. HEENT: normal. Neck: Supple, no JVD, carotid bruits, or masses. Cardiac: RRR, no murmurs, rubs, or gallops. No clubbing, cyanosis, edema.  Radials//PT 2+ and equal bilaterally.  Respiratory:  Respirations regular and unlabored, clear to auscultation bilaterally. GI: Soft, nontender, nondistended.. MS: No deformity or atrophy. Skin: Warm and dry, no rash. Neuro:  Strength and sensation are intact. Psych: Normal affect.  Assessment & Plan    1. TIA - Admission 02/2020 with probable TIA.  Following with neurology.   Her neurologist changed her to Plavix. She completed 4 week ZIO monitor.  Initial 2-week monitor preliminary report reviewed with sinus rhythm, 34 episodes of SVT which are asymptomatic, 1 run of VT.  No evidence of atrial fibrillation or flutter nor pause.  Will await second monitor before consideration of adding beta-blocker for prevention of episodes of SVT.  Presently her episodes of SVT are asymptomatic with no palpitations, sensation of rapid heart rate.  2. Bradycardia - Longstanding history with appropriate chronotropic response.  No lightheadedness, dizziness, near-syncope, syncope.  No indication for further evaluation at this time.  Heart rate 70 bpm today.  3. LDL, LDL goal <70 - 03/03/20 LDL 117. Her neurologist increased her Atorvastatin to 40mg .  Lipid panel today for monitoring.  If LDL not at goal consider  increased dose to 80 mg versus addition of Zetia.  4. HTN - BP goal of less than 130/80.  BP remains elevated.  Start amlodipine 5 mg daily.  May require further up titration which can be done at her upcoming follow-up with primary care if needed.   5. DOE/Emphysema/Hiatal Hernia - Recent chest CTA with no PE, emphysema. Lexiscan myoview without evidence of ischemia.  Encouraged to discuss diagnosis of emphysema and hiatal hernia with primary care provider.  Both can be contributory to her dyspnea.  She is compliant with her omeprazole daily.  We did discuss adding famotidine at bedtime to see if it helps with her symptoms.  Disposition: Follow up in 3 month(s) with Dr. Saunders Revel or APP.   Loel Dubonnet, NP 07/02/2020, 4:56 PM

## 2020-07-02 NOTE — Patient Instructions (Addendum)
Medication Instructions:  Your physician has recommended you make the following change in your medication:   START Amlodipine 5mg  daily  You may try Famotidine (Pepcid) at night to help with your hiatal hernia.   *If you need a refill on your cardiac medications before your next appointment, please call your pharmacy*  Lab Work: Your physician recommends that you return for lab work today: lipid panel, direct LDL, CMP  If you have labs (blood work) drawn today and your tests are completely normal, you will receive your results only by: Marland Kitchen MyChart Message (if you have MyChart) OR . A paper copy in the mail If you have any lab test that is abnormal or we need to change your treatment, we will call you to review the results.  Testing/Procedures: Your 1st ZIO monitor showed mostly normal sinus rhythm with an occasional fast heart beat. This is a good result! No evidence of atrial fibrillation or significant pauses.   Follow-Up: At [facility], you and your health needs are our priority.  As part of our continuing mission to provide you with exceptional heart care, we have created designated Provider Care Teams.  These Care Teams include your primary Cardiologist (physician) and Advanced Practice Providers (APPs -  Physician Assistants and Nurse Practitioners) who all work together to provide you with the care you need, when you need it.  We recommend signing up for the patient portal called "MyChart".  Sign up information is provided on this After Visit Summary.  MyChart is used to connect with patients for Virtual Visits (Telemedicine).  Patients are able to view lab/test results, encounter notes, upcoming appointments, etc.  Non-urgent messages can be sent to your provider as well.   To learn more about what you can do with MyChart, go to [email or web address].    Your next appointment:   3 month(s)  The format for your next appointment:   In Person  Provider:   You may see  Nelva Bush, MD or one of the following Advanced Practice Providers on your designated Care Team:    Murray Hodgkins, NP  Christell Faith, PA-C  Marrianne Mood, PA-C  Laurann Montana, NP  Other Instructions  Hiatal Hernia  A hiatal hernia occurs when part of the stomach slides above the muscle that separates the abdomen from the chest (diaphragm). A person can be born with a hiatal hernia (congenital), or it may develop over time. In almost all cases of hiatal hernia, only the top part of the stomach pushes through the diaphragm. Many people have a hiatal hernia with no symptoms. The larger the hernia, the more likely it is that you will have symptoms. In some cases, a hiatal hernia allows stomach acid to flow back into the tube that carries food from your mouth to your stomach (esophagus). This may cause heartburn symptoms. Severe heartburn symptoms may mean that you have developed a condition called gastroesophageal reflux disease (GERD). What are the causes? This condition is caused by a weakness in the opening (hiatus) where the esophagus passes through the diaphragm to attach to the upper part of the stomach. A person may be born with a weakness in the hiatus, or a weakness can develop over time. What increases the risk? This condition is more likely to develop in:  Older people. Age is a major risk factor for a hiatal hernia, especially if you are over the age of 34.  Pregnant women.  People who are overweight.  People who have frequent constipation. What  are the signs or symptoms? Symptoms of this condition usually develop in the form of GERD symptoms. Symptoms include:  Heartburn.  Belching.  Indigestion.  Trouble swallowing.  Coughing or wheezing.  Sore throat.  Hoarseness.  Chest pain.  Nausea and vomiting. How is this diagnosed? This condition may be diagnosed during testing for GERD. Tests that may be done include:  X-rays of your stomach or  chest.  An upper gastrointestinal (GI) series. This is an X-ray exam of your GI tract that is taken after you swallow a chalky liquid that shows up clearly on the X-ray.  Endoscopy. This is a procedure to look into your stomach using a thin, flexible tube that has a tiny camera and light on the end of it. How is this treated? This condition may be treated by:  Dietary and lifestyle changes to help reduce GERD symptoms.  Medicines. These may include: ? Over-the-counter antacids. ? Medicines that make your stomach empty more quickly. ? Medicines that block the production of stomach acid (H2 blockers). ? Stronger medicines to reduce stomach acid (proton pump inhibitors).  Surgery to repair the hernia, if other treatments are not helping. If you have no symptoms, you may not need treatment. Follow these instructions at home: Lifestyle and activity  Do not use any products that contain nicotine or tobacco, such as cigarettes and e-cigarettes. If you need help quitting, ask your health care provider.  Try to achieve and maintain a healthy body weight.  Avoid putting pressure on your abdomen. Anything that puts pressure on your abdomen increases the amount of acid that may be pushed up into your esophagus. ? Avoid bending over, especially after eating. ? Raise the head of your bed by putting blocks under the legs. This keeps your head and esophagus higher than your stomach. ? Do not wear tight clothing around your chest orfile:///C:/[email or web address] [email or web address].bmp stomach. ? Try not to strain when having a bowel movement, when urinating, or when lifting heavy objects. Eating and drinking  Avoid foods that can worsen GERD symptoms. These may include: ? Fatty foods, like fried foods. ? Citrus fruits, like oranges or lemon. ? Other foods and drinks that contain acid, like orange juice or tomatoes. ? Spicy food. ? Chocolate.  Eat frequent small  meals instead of three large meals a day. This helps prevent your stomach from getting too full. ? Eat slowly. ? Do not lie down right after eating. ? Do not eat 1-2 hours before bed.  Do not drink beverages with caffeine. These include cola, coffee, cocoa, and tea.  Do not drink alcohol. General instructions  Take over-the-counter and prescription medicines only as told by your health care provider.  Keep all follow-up visits as told by your health care provider. This is important. Contact a health care provider if:  Your symptoms are not controlled with medicines or lifestyle changes.  You are having trouble swallowing.  You have coughing or wheezing that will not go away. Get help right away if:  Your pain is getting worse.  Your pain spreads to your arms, neck, jaw, teeth, or back.  You have shortness of breath.  You sweat for no reason.  You feel sick to your stomach (nauseous) or you vomit.  You vomit blood.  You have bright red blood in your stools.  You have black, tarry stools. This information is not intended to replace advice given to you by your health care provider. Make sure you discuss any questions you have with your  health care provider. Document Revised: 09/04/2017 Document Reviewed: 04/27/2017 Elsevier Patient Education  [employer].

## 2020-07-03 ENCOUNTER — Telehealth: Payer: Self-pay

## 2020-07-03 DIAGNOSIS — E782 Mixed hyperlipidemia: Secondary | ICD-10-CM

## 2020-07-03 LAB — COMPREHENSIVE METABOLIC PANEL
ALT: 16 IU/L (ref 0–32)
AST: 16 IU/L (ref 0–40)
Albumin/Globulin Ratio: 1.8 (ref 1.2–2.2)
Albumin: 4.3 g/dL (ref 3.7–4.7)
Alkaline Phosphatase: 66 IU/L (ref 44–121)
BUN/Creatinine Ratio: 14 (ref 12–28)
BUN: 18 mg/dL (ref 8–27)
Bilirubin Total: 0.8 mg/dL (ref 0.0–1.2)
CO2: 25 mmol/L (ref 20–29)
Calcium: 9.5 mg/dL (ref 8.7–10.3)
Chloride: 105 mmol/L (ref 96–106)
Creatinine, Ser: 1.28 mg/dL — ABNORMAL HIGH (ref 0.57–1.00)
GFR calc Af Amer: 46 mL/min/{1.73_m2} — ABNORMAL LOW (ref >59)
GFR calc non Af Amer: 40 mL/min/{1.73_m2} — ABNORMAL LOW (ref >59)
Globulin, Total: 2.4 g/dL (ref 1.5–4.5)
Glucose: 99 mg/dL (ref 65–99)
Potassium: 4.7 mmol/L (ref 3.5–5.2)
Sodium: 143 mmol/L (ref 134–144)
Total Protein: 6.7 g/dL (ref 6.0–8.5)

## 2020-07-03 LAB — LIPID PANEL
Chol/HDL Ratio: 2.8 ratio (ref 0.0–4.4)
Cholesterol, Total: 156 mg/dL (ref 100–199)
HDL: 55 mg/dL (ref >39)
LDL Chol Calc (NIH): 82 mg/dL (ref 0–99)
Triglycerides: 107 mg/dL (ref 0–149)
VLDL Cholesterol Cal: 19 mg/dL (ref 5–40)

## 2020-07-03 LAB — LDL CHOLESTEROL, DIRECT: LDL Direct: 74 mg/dL (ref 0–99)

## 2020-07-03 MED ORDER — ATORVASTATIN CALCIUM 80 MG PO TABS: 80.0000 mg | ORAL_TABLET | Freq: Every day | ORAL | 1 refills | Status: DC

## 2020-07-03 NOTE — Telephone Encounter (Signed)
Patient made aware of lab results and Laurann Montana, NP recommendation. Patient is agreeable with increasing atorvastatin to 80 mg daily.  She will [facility] 2 mo fasting lipid [facility]  Patient verbalized understanding and voiced appreciation for the call.

## 2020-07-03 NOTE — Telephone Encounter (Signed)
-----   Message from Loel Dubonnet, NP sent at 07/03/2020  8:26 AM EDT ----- Stable kidney function. Normal liver function & electrolytes. LDL (bad cholesterol) remains above goal of 70. Though has improve from 117 to 75.  Remainder of cholesterol numbers look good.   Her neurologist recommended LDL <70 due to history of TIA in May. Recommend increased dose of Atorvastatin to 80mg  daily & repeat lipid/liver in 8 weeks.

## 2020-07-16 ENCOUNTER — Telehealth: Payer: Self-pay | Admitting: Family

## 2020-07-16 NOTE — Telephone Encounter (Signed)
Loel Dubonnet, NP  07/13/2020 3:31 PM EDT Back to Top    Monitor showed predominantly NSR. Rare early beats called PVC/PAC. Single episode of NSVT and 47 runs of atrial tachycardia (fast but regular heart beat). No atrial fibrillation nor significant pause. Very similar findings to the first monitor.   Due to episodes of atrial tachycardia, recommend starting Toprol 25mg  daily for prevention of atrial tachycardia.

## 2020-07-16 NOTE — Telephone Encounter (Signed)
I spoke with the patient regarding her monitor results and Laurann Montana, NP's recommendations to: 1) Start metoprolol succinate 25 mg once daily for her atrial tachycardia.  The patient voices understanding of results and recommendations. However, she would like to hold off on starting the metoprolol at this time as she feels she has had several medication changes recently and did not want to add another drug.  I have advised her she may call us back at any time if the fast heart rates are bothering her and we can send in the metoprolol for her.   Otherwise, she is aware we will see her back in January at her appointment with Dr. Saunders Revel.  The patient voices understanding and is agreeable.  To Urban Gibson, NP as an FYI only.

## 2020-07-16 NOTE — Telephone Encounter (Signed)
Thanks for the update! Agree with plan.   Loel Dubonnet, NP

## 2020-07-18 ENCOUNTER — Ambulatory Visit (INDEPENDENT_AMBULATORY_CARE_PROVIDER_SITE_OTHER): Payer: Medicare Other | Admitting: Family Medicine

## 2020-07-18 ENCOUNTER — Encounter: Payer: Self-pay | Admitting: Family Medicine

## 2020-07-18 ENCOUNTER — Other Ambulatory Visit: Payer: Self-pay

## 2020-07-18 VITALS — BP 122/70 | HR 66 | Temp 97.7°F | Ht 63.0 in | Wt 153.5 lb

## 2020-07-18 DIAGNOSIS — F5104 Psychophysiologic insomnia: Secondary | ICD-10-CM | POA: Diagnosis not present

## 2020-07-18 DIAGNOSIS — E663 Overweight: Secondary | ICD-10-CM

## 2020-07-18 DIAGNOSIS — R0602 Shortness of breath: Secondary | ICD-10-CM | POA: Diagnosis not present

## 2020-07-18 DIAGNOSIS — N1832 Chronic kidney disease, stage 3b: Secondary | ICD-10-CM

## 2020-07-18 DIAGNOSIS — G4700 Insomnia, unspecified: Secondary | ICD-10-CM

## 2020-07-18 MED ORDER — ALPRAZOLAM 0.25 MG PO TABS: 0.2500 mg | ORAL_TABLET | Freq: Every evening | ORAL | 1 refills | Status: DC | PRN

## 2020-07-18 NOTE — Patient Instructions (Addendum)
Good to see you today  Keep up the good work with your activity level, diet  Follow up in 6 months

## 2020-07-18 NOTE — Progress Notes (Signed)
   Subjective:    Patient ID: Carla Lyons, female    DOB: 04-16-1941, 79 y.o.   MRN: [id number]  HPI . Chief Complaint  Patient presents with  . Follow-up    bradycardia, SOB   This is a 79 yo female who presents today for follow up of SOB, bradycardia. Has been following up with cardiology, had long term monitor which showed 47 runs of atrial tachycardia. She does not notice palpitations. Cardiology recommended that she start low dose metoprolol. She is waiting for follow up in Jan to discuss further. She has not wanted to start additional medication since she has been asymptomatic.   Overweight- has been watching carbohydrate intake and reducing snacking.   DOE- significantly improved with weight loss and increased walking.   Bradycardia- reason for cardiology referral. Patient denies chest pain, lightheadedness.   LDL not at goal, has gone up on atorvastatin to 80 mg.   Insomnia- her sister has pulmonary fibrosis, progressing. Patient using alprazolam 3-4 times a week for sleep with good results.   Review of Systems Per HPI    Objective:   Physical Exam Physical Exam  Constitutional: Oriented to person, place, and time. Appears well-developed and well-nourished.  HENT:  Head: Normocephalic and atraumatic.  Eyes: Conjunctivae are normal.  Neck: Normal range of motion. Neck supple.  Cardiovascular: Normal rate, regular rhythm and normal heart sounds.   Pulmonary/Chest: Effort normal and breath sounds normal.  Musculoskeletal: No lower extremity edema.   Neurological: Alert and oriented to person, place, and time.  Skin: Skin is warm and dry.  Psychiatric: Normal mood and affect. Behavior is normal. Judgment and thought content normal.  Vitals reviewed.    BP 122/70   Pulse 66   Temp 97.7 F (36.5 C) (Temporal)   Ht 5\' 3"  (1.6 m)   Wt 153 lb 8 oz (69.6 kg)   SpO2 94%   BMI 27.19 kg/m   Wt Readings from Last 3 Encounters:  07/18/20 153 lb 8 oz (69.6 kg)  07/02/20  160 lb (72.6 kg)  05/22/20 161 lb (73 kg)       Assessment & Plan:  1. Psychophysiological insomnia - discussed importance of using sparingl - ALPRAZolam (XANAX) 0.25 MG tablet; Take 1 tablet (0.25 mg total) by mouth at bedtime as needed for anxiety.  Dispense: 30 tablet; Refill: 1  2. Stage 3b chronic kidney disease ([place]) - continued follow up with nephrology  3. Shortness of breath - improved with weight loss and increased walking  4. Overweight (BMI 25.0-29.9) - improved with diet, walking, encouraged her to continue to make healthy food choices  This visit occurred during the SARS-CoV-2 public health emergency.  Safety protocols were in place, including screening questions prior to the visit, additional usage of staff PPE, and extensive cleaning of exam room while observing appropriate contact time as indicated for disinfecting solutions.      Clarene Reamer, FNP-BC  [facility]  07/19/2020 6:50 AM

## 2020-08-08 ENCOUNTER — Ambulatory Visit: Payer: Medicare Other | Admitting: Family Medicine

## 2020-08-09 ENCOUNTER — Telehealth: Payer: Self-pay | Admitting: Family Medicine

## 2020-08-09 NOTE — Telephone Encounter (Signed)
Pt called to schedule appointment with Dr Lorelei Pont today.  She fell 1 week ago and injured right knee.  Dr copland schedule is full offered 1:45 with dr Silvio Pate or appointment with debbie tomorrow 11/5  Pt declined.    Offered to call [place] to see if I can get pt into one of the sports meds dr there today.  Pt declined she only wanted to see dr copland.  Offered Monday appointment with dr copland. Pt stated she didn't want appointment Monday.  She would keep doing what she was doing if that didn't work she would go to er and hung up.

## 2020-08-09 NOTE — Telephone Encounter (Signed)
Noted  

## 2020-08-13 ENCOUNTER — Ambulatory Visit (INDEPENDENT_AMBULATORY_CARE_PROVIDER_SITE_OTHER): Payer: Medicare Other | Admitting: Adult Health

## 2020-08-13 ENCOUNTER — Encounter: Payer: Self-pay | Admitting: Adult Health

## 2020-08-13 VITALS — BP 147/72 | HR 55 | Ht 63.0 in | Wt 156.0 lb

## 2020-08-13 DIAGNOSIS — E785 Hyperlipidemia, unspecified: Secondary | ICD-10-CM | POA: Diagnosis not present

## 2020-08-13 DIAGNOSIS — R7303 Prediabetes: Secondary | ICD-10-CM

## 2020-08-13 DIAGNOSIS — I10 Essential (primary) hypertension: Secondary | ICD-10-CM

## 2020-08-13 DIAGNOSIS — G459 Transient cerebral ischemic attack, unspecified: Secondary | ICD-10-CM | POA: Diagnosis not present

## 2020-08-13 NOTE — Patient Instructions (Addendum)
Continue clopidogrel 75 mg daily  and atorvastatin for secondary stroke prevention  Please ensure follow up with your cardiologist regarding your prior heart monitor and episodes of fast heart rate (non-sustained ventricular tachycardiac) with additional information provided below for your review  Continue to follow up with PCP regarding cholesterol, blood pressure and prediabetes management  Maintain strict control of hypertension with blood pressure goal below 130/90, pre-diabetes with hemoglobin A1c goal below 7 % and cholesterol with LDL cholesterol (bad cholesterol) goal below 70 mg/dL.     Followup in the future with me in 6 months or call earlier if needed     Thank you for coming to see [place] at [facility]. I hope we have been able to provide you high quality care today.  You may receive a patient satisfaction survey over the next few weeks. We would appreciate your feedback and comments so that we may continue to improve ourselves and the health of our patients.    Ventricular Tachycardia  Ventricular tachycardia is a fast heartbeat that begins in the lower chambers of the heart (ventricles). It is a type of abnormal heart rhythm (arrhythmia). A normal heartbeat usually starts when an area in the heart called the sinoatrial (SA) node releases an electrical signal. With ventricular tachycardia, electrical signals in the lower part of the heart fire abnormally and interfere with the electrical signals sent out by the SA node. A normal heart rate is 60-100 beats per minute. During an episode of ventricular tachycardia, the heart reaches 100 beats per minute or higher. This condition can be life-threatening and should be treated immediately. What are the causes? This condition is caused by abnormal electrical activity in the lower part of the heart. This may result from:  Medicines.  Diseases of the heart muscle (cardiomyopathy).  The heart not getting enough  oxygen. This may be caused by blood flow problems in the arteries.  An inflammatory disease that affects multiple areas of the body (sarcoidosis).  Drug use, such as cocaine, amphetamine, or anabolic steroid use. What increases the risk? You are more likely to develop this condition if:  You have had a heart attack.  You have: ? Heart failure or cardiomyopathy. ? Heart defects that you were born with (congenital heart defects). ? Abnormal heart tissue. ? Heart valves that leak or are narrow. ? Diabetes. ? An infection that affects the heart. ? High blood pressure. ? An overactive or underactive thyroid. ? Sleep apnea. ? A family history of stopped heartbeat (cardiac arrest) or coronary artery disease. ? High cholesterol.  You smoke.  You drink alcohol heavily.  You use drugs, such as cocaine. What are the signs or symptoms? Symptoms of this condition include:  A pounding heartbeat.  Feeling as if your heart is skipping beats or fluttering (palpitations).  Shortness of breath.  Anxiety.  Dizziness.  Light-headedness.  Fainting.  Chest pain.  Cardiac arrest caused by an irregular heartbeat (arrhythmia). How is this diagnosed? This condition may be diagnosed based on:  Your symptoms and medical history.  A physical exam.  Electrocardiogram (ECG). This test is done to check for problems with electrical activity in the heart.  Holter monitor or event monitor test. This test involves wearing a portable device that monitors your heart rate over time. You may also have other tests, including:  Blood tests.  Chest X-ray.  Echocardiogram. This test involves using sound waves to create images of the heart.  Angiogram. During this test, dye is  injected into your bloodstream, and then X-rays are taken. The dye lets your health care provider see how blood flows through your arteries.  Exercise stress test. During this test, an ECG is done while you exercise on a  treadmill.  Cardiac CT scan or cardiac MRI. How is this treated? Treatment for this condition depends on the cause. Treatment may include:  Medicines that slow the heart rate and return it to a normal rhythm (anti-arrhythmics).  An electric shock (cardioversion) that makes the heart go back to a normal rhythm.  An electrophysiology study. This procedure can help locate areas of heart tissue that are causing rapid heartbeats. ? In this procedure, a thin, flexible tube (catheter) is inserted into one of your veins and moved to your heart to evaluate your heart's electrical activity. ? In some cases, the heart tissue that is causing problems may be killed with radiofrequency energy delivered through the catheter (radiofrequency ablation). This may help your heart keep a normal rhythm.  An implantable cardioverter defibrillator (ICD). This is a small device that monitors your heartbeat. When it senses an irregular heartbeat, it sends a shock to bring the heartbeat back to normal. The ICD is implanted under the skin in the chest.  Surgery to improve blood flow to the heart.  Genetic counseling to check whether your family members are at risk for ventricular tachycardia. Follow these instructions at home: Lifestyle  Do not use any products that contain nicotine or tobacco, such as cigarettes and e-cigarettes. If you need help quitting, ask your health care provider.  Do not use stimulant drugs, such as cocaine or methamphetamines.  Maintain a healthy weight.  Manage stress. Try to do this with relaxation exercises, yoga, quiet time, or meditation. Eating and drinking  Eat a healthy diet. This includes plenty of fruits and vegetables, whole grains, lean meats, and low-fat or fat-free dairy products.  Avoid eating foods that are high in saturated fat, trans fat, sugar, or salt (sodium).  Ask your health care provider if you may drink alcohol. ? If alcohol triggers episodes of ventricular  tachycardia, do not drink alcohol. ? If alcohol does not trigger episodes, limit alcohol intake to no more than 1 drink a day for nonpregnant women and 2 drinks a day for men. One drink equals 12 oz of beer, 5 oz of wine, or 1 oz of hard liquor. General instructions  Take over-the-counter and prescription medicines only as told by your health care provider.  Exercise regularly. Aim for 150 minutes of moderate exercise or 75 minutes of vigorous exercise per week. Ask your health care provider what exercises are safe for you.  Keep all follow-up visits as told by your health care provider. This is important. Contact a health care provider if:  Your symptoms get worse.  You develop new symptoms, such as new palpitations.  You feel depressed. Get help right away if:  You have an episode of ventricular tachycardia that lasts 30 seconds or more.  You have chest pain.  You have trouble breathing. These symptoms may represent a serious problem that is an emergency. Do not wait to see if the symptoms will go away. Get medical help right away. Call your local emergency services (911 in the [place] Do not drive yourself to the hospital. Summary  Ventricular tachycardia is a fast heartbeat that begins in the lower chambers of the heart. This condition can be life-threatening and should be treated immediately.  This condition may be treated with medicines,  electric shock, radiofrequency energy, surgery, or insertion of an implantable cardioverter defibrillator (ICD).  Get help right away if you have chest pain, trouble breathing, or ventricular tachycardia symptoms that last more than 30 seconds. This information is not intended to replace advice given to you by your health care provider. Make sure you discuss any questions you have with your health care provider. Document Revised: 09/04/2017 Document Reviewed: 11/13/2016 Elsevier Patient Education  [place].
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
## 2020-08-28 ENCOUNTER — Ambulatory Visit (INDEPENDENT_AMBULATORY_CARE_PROVIDER_SITE_OTHER)
Admission: RE | Admit: 2020-08-28 | Discharge: 2020-08-28 | Disposition: A | Discharge status: Home / Self Care | Payer: Medicare Other | Source: Ambulatory Visit | Attending: Family Medicine | Admitting: Family Medicine

## 2020-08-28 ENCOUNTER — Encounter: Payer: Self-pay | Admitting: Family Medicine

## 2020-08-28 ENCOUNTER — Other Ambulatory Visit: Payer: Self-pay

## 2020-08-28 ENCOUNTER — Ambulatory Visit (INDEPENDENT_AMBULATORY_CARE_PROVIDER_SITE_OTHER): Payer: Medicare Other | Admitting: Family Medicine

## 2020-08-28 VITALS — BP 142/80 | HR 63 | Temp 97.9°F | Ht 63.0 in | Wt 155.2 lb

## 2020-08-28 DIAGNOSIS — M7502 Adhesive capsulitis of left shoulder: Secondary | ICD-10-CM

## 2020-08-28 DIAGNOSIS — M25512 Pain in left shoulder: Secondary | ICD-10-CM | POA: Diagnosis not present

## 2020-08-28 MED ORDER — TRIAMCINOLONE ACETONIDE 40 MG/ML IJ SUSP
40.0000 mg | Freq: Once | INTRAMUSCULAR | Status: AC
  Administered 2020-08-28: 40 mg via INTRA_ARTICULAR

## 2020-08-28 NOTE — Progress Notes (Signed)
Makinley Muscato T. Zeth Buday, MD, [place]  [facility] [place] [facility] [place], [address]  Phone: [phone]  FAX: [phone]  Carla Lyons - 79 y.o. female  MRN [id number]  Date of Birth: [birth year]  Date: 08/28/2020  PCP: Elby Beck, FNP  Referral: Elby Beck, FNP  Chief Complaint  Patient presents with  . Shoulder Pain    Left    This visit occurred during the SARS-CoV-2 public health emergency.  Safety protocols were in place, including screening questions prior to the visit, additional usage of staff PPE, and extensive cleaning of exam room while observing appropriate contact time as indicated for disinfecting solutions.   Subjective:   Carla Lyons is a 79 y.o. very pleasant female patient with Body mass index is 27.5 kg/m. who presents with the following:  L Shoulder pain: She is a very pleasant lady, and I have seen her for some left-sided adhesive capsulitis before.  Last time I saw her was May 2021, and at that point I did do a shoulder injection, and I started her on [facility] range of motion protocol for frozen shoulder.  She did quite well and her pain and range of motion did improve, but she has had a minor fall several weeks ago and she has since had some pain in her shoulder and continued restriction of motion.  She fell on her side on the right, and she does not think that she hit her shoulder at all.  She has retained strength and all sensation.  [place] a few weeks ago, did not hit her shoulder at all.   Picking up coffee.   UE.  Will hurt more at night when she is trying to go to sleep.  Review of Systems is noted in the HPI, as appropriate   Objective:   BP (!) 142/80   Pulse 63   Temp 97.9 F (36.6 C) (Temporal)   Ht 5\' 3"  (1.6 m)   Wt 155 lb 4 oz (70.4 kg)   SpO2 92%   BMI 27.50 kg/m    Shoulder: R and L Inspection: No muscle wasting or  winging Ecchymosis/edema: neg  AC joint, scapula, clavicle: NT Cervical spine: NT, full ROM Spurling's: neg ABNORMAL SIDE TESTED: Left UNLESS OTHERWISE NOTED, THE CONTRALATERAL SIDE HAS FULL RANGE OF MOTION. Abduction: 5/5, LIMITED TO 100 DEGREES Flexion: 5/5, LIMITED TO 105 DEGNO ROM  IR, lift-off: 5/5. TESTED AT 90 DEGREES OF ABDUCTION, LIMITED TO 10 DEGREES ER at neutral:  5/5, TESTED AT 90 DEGREES OF ABDUCTION, LIMITED TO 40 DEGREES AC crossover and compression: PAIN Drop Test: neg Empty Can: neg Supraspinatus insertion: NT Bicipital groove: NT ALL OTHER SPECIAL TESTING EQUIVOCAL GIVEN LOSS OF MOTION C5-T1 intact Sensation intact Grip 5/5     Radiology: DG Shoulder Left  Result Date: 08/28/2020 CLINICAL DATA:  Loss of motion, osteoarthritis EXAM: LEFT SHOULDER - 2+ VIEW COMPARISON:  None. FINDINGS: There is no evidence of fracture or dislocation. There is no evidence of arthropathy or other focal bone abnormality. Soft tissues are unremarkable. IMPRESSION: No fracture or dislocation of the left shoulder. Joint spaces are preserved. Electronically Signed   By: Eddie Candle M.D.   On: 08/28/2020 15:48     Assessment and Plan:     ICD-10-CM   1. Adhesive capsulitis of left shoulder  M75.02 triamcinolone acetonide (KENALOG-40) injection 40 mg  2. Acute pain of left shoulder  M25.512 DG Shoulder  Left   Total encounter time: 30 minutes. This includes total time spent on the day of encounter.  Chart review as well as independent review of x-ray, discussion with the patient, and anatomical review.  She has classic frozen shoulder, I am seeing her before, and I think that she has ignited this again after her recent fall.  She is well versed in capsular range of motion work from her prior office visit with me, she is going to resume this in approximately 2 to 3 days.  Her strength is 5/5 in all directions, and the primary pain driver here is her adhesive capsulitis.  Status post  injection, she did have some significant improvement with the range of motion, but there was only approximately 30% increase of motion, and this was still notably decreased compared to what would be expected.  On her plain films, the patient has no significant glenohumeral arthritis.  Intraarticular Shoulder Aspiration/Injection Procedure Note Carla Lyons 1940/11/27 Date of procedure: 08/28/2020  Procedure: Large Joint Aspiration / Injection of Shoulder, Intraarticular, L Indications: Pain  Procedure Details Verbal consent was obtained from the patient. Risks explained and contrasted with benefits and alternatives. Patient prepped with Chloraprep and Ethyl Chloride used for anesthesia. An intraarticular shoulder injection was performed using the posterior approach; needle placed into joint capsule without difficulty. The patient tolerated the procedure well and had decreased pain post injection. No complications. Injection: 9 cc of Lidocaine 1% and 1 mL Kenalog 40 mg. Needle: 21 gauge, 2 inch   Meds ordered this encounter  Medications  . triamcinolone acetonide (KENALOG-40) injection 40 mg   There are no discontinued medications. Orders Placed This Encounter  Procedures  . DG Shoulder Left    Follow-up: No follow-ups on file.  Signed,  Maud Deed. Keshun Berrett, MD   Outpatient Encounter Medications as of 08/28/2020  Medication Sig  . ALPRAZolam (XANAX) 0.25 MG tablet Take 1 tablet (0.25 mg total) by mouth at bedtime as needed for anxiety.  Marland Kitchen amLODipine (NORVASC) 5 MG tablet Take 1 tablet (5 mg total) by mouth daily.  Marland Kitchen atorvastatin (LIPITOR) 80 MG tablet Take 1 tablet (80 mg total) by mouth daily.  Marland Kitchen b complex vitamins tablet Take 1 tablet by mouth daily.  . benazepril (LOTENSIN) 10 MG tablet TAKE 1 TABLET BY MOUTH EVERY DAY (Patient taking differently: Take 10 mg by mouth every evening. )  . BIOTIN 5000 PO Take 10,000 mcg by mouth daily.  . butalbital-acetaminophen-caffeine  (FIORICET) 50-325-40 MG tablet Take 1-2 tablets by mouth every 6 (six) hours as needed for headache. Do not take more than 2 times per week given possibility of rebound headaches.  . cholecalciferol (VITAMIN D) 1000 UNITS tablet Take 1,000 Units by mouth daily.   . clopidogrel (PLAVIX) 75 MG tablet Take 1 tablet (75 mg total) by mouth daily.  . Cyanocobalamin 1000 MCG SUBL Place 1 tablet under the tongue daily.  . ferrous sulfate 325 (65 FE) MG tablet Take 325 mg by mouth at bedtime.  . furosemide (LASIX) 20 MG tablet Take 20 mg by mouth as needed for edema.  . hydrochlorothiazide (HYDRODIURIL) 12.5 MG tablet Take 12.5 mg by mouth daily.   Marland Kitchen levothyroxine (SYNTHROID) 88 MCG tablet Take 1 tablet (88 mcg total) by mouth daily before breakfast.  . Melatonin 3 MG TABS Take 3 mg by mouth at bedtime as needed (for sleep).   . Omega-3 Fatty Acids (FISH OIL) 1000 MG CAPS Take 1,000 mg by mouth in the morning and at  bedtime.   Marland Kitchen omeprazole (PRILOSEC) 40 MG capsule Take 1 capsule (40 mg total) by mouth daily before breakfast.  . [EXPIRED] triamcinolone acetonide (KENALOG-40) injection 40 mg    No facility-administered encounter medications on file as of 08/28/2020.

## 2020-09-19 ENCOUNTER — Encounter: Payer: Self-pay | Admitting: Family Medicine

## 2020-09-19 ENCOUNTER — Ambulatory Visit (INDEPENDENT_AMBULATORY_CARE_PROVIDER_SITE_OTHER): Payer: Medicare Other | Admitting: Family Medicine

## 2020-09-19 ENCOUNTER — Telehealth: Payer: Self-pay | Admitting: *Deleted

## 2020-09-19 ENCOUNTER — Other Ambulatory Visit: Payer: Self-pay

## 2020-09-19 ENCOUNTER — Telehealth: Payer: Self-pay | Admitting: Adult Health

## 2020-09-19 VITALS — BP 136/78 | HR 89 | Temp 98.2°F | Ht 63.0 in | Wt 153.8 lb

## 2020-09-19 DIAGNOSIS — E782 Mixed hyperlipidemia: Secondary | ICD-10-CM

## 2020-09-19 DIAGNOSIS — Z79899 Other long term (current) drug therapy: Secondary | ICD-10-CM

## 2020-09-19 DIAGNOSIS — H9201 Otalgia, right ear: Secondary | ICD-10-CM

## 2020-09-19 DIAGNOSIS — R519 Headache, unspecified: Secondary | ICD-10-CM | POA: Diagnosis not present

## 2020-09-19 LAB — SEDIMENTATION RATE: Sed Rate: 27 mm/hr (ref 0–30)

## 2020-09-19 LAB — COMPREHENSIVE METABOLIC PANEL
ALT: 25 U/L (ref 0–35)
AST: 22 U/L (ref 0–37)
Albumin: 4.3 g/dL (ref 3.5–5.2)
Alkaline Phosphatase: 49 U/L (ref 39–117)
BUN: 22 mg/dL (ref 6–23)
CO2: 27 mEq/L (ref 19–32)
Calcium: 9.3 mg/dL (ref 8.4–10.5)
Chloride: 106 mEq/L (ref 96–112)
Creatinine, Ser: 1.36 mg/dL — ABNORMAL HIGH (ref 0.40–1.20)
GFR: 37.31 mL/min — ABNORMAL LOW (ref >60.00)
Glucose, Bld: 90 mg/dL (ref 70–99)
Potassium: 4.3 mEq/L (ref 3.5–5.1)
Sodium: 139 mEq/L (ref 135–145)
Total Bilirubin: 0.6 mg/dL (ref 0.2–1.2)
Total Protein: 7 g/dL (ref 6.0–8.3)

## 2020-09-19 LAB — HIGH SENSITIVITY CRP: CRP, High Sensitivity: 1.27 mg/L (ref 0.000–5.000)

## 2020-09-19 LAB — LIPID PANEL
Cholesterol: 185 mg/dL (ref 0–200)
HDL: 59.4 mg/dL (ref >39.00)
LDL Cholesterol: 95 mg/dL (ref 0–99)
NonHDL: 125.38
Total CHOL/HDL Ratio: 3
Triglycerides: 152 mg/dL — ABNORMAL HIGH (ref 0.0–149.0)
VLDL: 30.4 mg/dL (ref 0.0–40.0)

## 2020-09-19 NOTE — Telephone Encounter (Signed)
Per review of PCP note, no focal neurological deficits and in the process of doing appropriate work-up. If PCP feels patient needs to be seen/further evaluated from a neurological standpoint, request f/u visit with Dr. Jaynee Eagles for further evaluation, again, only if indicated from PCP perspective.

## 2020-09-19 NOTE — Telephone Encounter (Signed)
Pt has called to report Right side of head is sore and ear aches pt has pain on side of face and near temple.  Pt asked if she needs to go to ED and she said no.  Pt would like a call to discuss and see if RN would suggest a f/u before May
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
## 2020-09-19 NOTE — Telephone Encounter (Signed)
Spoke to pt. Notified of lab results per Laurann Montana, NP result note:  Lipid panel 2 months ago showed direct LDL of 75 and we increased her atorvastatin to 80 mg daily. However, lipid panel today shows LDL of 95. Can we please confirm whether her lab work today was fasting? Also confirm that she is taking atorvastatin 80 mg daily as prescribed?   Suspect either she is not taking medicine as prescribed or was not true fasting value. If she has not been taking, educated to resume atorvastatin 80 mg daily. If was not a fasting lab, we can have [facility] for true fasting lipid pane with direct LDL or it can be collected at her upcoming appt with Dr. Saunders Revel. If she has been taking as prescribed and was fasting, let me know.    Pt states that she was not fasting, she had toast and peanut butter this morning prior to labs. She did confirm that she is taking Atorvastatin 80mg  daily as prescribed. Pt states that she will have labs repeated on Monday 12/20. Orders placed for lipid panel with direct LDL. Pt has no further questions at this time.

## 2020-09-19 NOTE — Telephone Encounter (Signed)
Attempted to call pt to discuss lab results and recc below. No answer. LMOM TCB.

## 2020-09-19 NOTE — Patient Instructions (Signed)
Good to see you today  I will notify you of lab results

## 2020-09-19 NOTE — Progress Notes (Signed)
Subjective:    Patient ID: Carla Lyons, female    DOB: [birth year], 79 y.o.   MRN: [id number]  HPI Chief Complaint  Patient presents with   Ear Pain    Pt stated that she has been experiencing Rt ear pain x4days. 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  Has had right sided scalp tenderness x 6 days. Pain is intermittent, sharp. Was blowing leaves 6 days ago and felt like something blew into her ear. No fever, myalgias, rash.  Has had shingles a couple of times, left upper trunk, right side of face.  Has tried acetaminophen and heat with some relief.   Her BIL passed away recently. Her sister has pulmonary fibrosis and is not doing well.   Review of Systems Per HPI    Objective:   Physical Exam Vitals reviewed.  Constitutional:      General: She is not in acute distress.    Appearance: Normal appearance. She is normal weight. She is not ill-appearing, toxic-appearing or diaphoretic.  HENT:     Head: Normocephalic and atraumatic.     Right Ear: Tympanic membrane and external ear normal.     Left Ear: Tympanic membrane, ear canal and external ear normal.     Ears:     Comments: Right canal with mild erythema, lower left aspect. No foreign body.     Nose: Nose normal.     Mouth/Throat:     Mouth: Mucous membranes are moist.     Pharynx: Oropharynx is clear.  Eyes:     Conjunctiva/sclera: Conjunctivae normal.  Cardiovascular:     Rate and Rhythm: Normal rate.  Pulmonary:      Effort: Pulmonary effort is normal.  Musculoskeletal:     Cervical back: Normal range of motion.  Skin:    General: Skin is warm and dry.  Neurological:     Mental Status: She is alert.     Comments: No facial asymmetry.        BP 136/78 (BP Location: Right Arm, Patient Position: Sitting, Cuff Size: Normal)    Pulse 89    Temp 98.2 F (36.8 C) (Temporal)    Ht 5\' 3"  (1.6 m)    Wt 153 lb 12.8 oz (69.8 kg)    SpO2 97%    BMI 27.24 kg/m  Wt Readings from Last 3 Encounters:  09/19/20 153 lb 12.8 oz (69.8 kg)  08/28/20 155 lb 4 oz (70.4 kg)  08/13/20 156 lb (70.8 kg)       Assessment & Plan:  1. Scalp pain - unclear etiology, could be mild case of shingles, will check labs to r/o inflammatory process like trigeminal neuralgia or temporal arteritis - High sensitivity CRP - Sedimentation rate - Comprehensive metabolic panel  2. Right ear pain - no worrisome findings on exam, per #1 - High sensitivity CRP - Sedimentation rate - Comprehensive metabolic panel  3. HYPERLIPIDEMIA, MIXED - atorvastatin dose increased several months ago, will check lipid/ lft - Hepatic function panel - Lipid Panel - Comprehensive metabolic panel  This visit occurred during the  SARS-CoV-2 public health emergency.  Safety protocols were in place, including screening questions prior to the visit, additional usage of staff PPE, and extensive cleaning of exam room while observing appropriate contact time as indicated for disinfecting solutions.    Clarene Reamer, FNP-BC  [facility]  09/19/2020 9:00 AM

## 2020-09-19 NOTE — Telephone Encounter (Signed)
-----   Message from Loel Dubonnet, NP sent at 09/19/2020  2:55 PM EST ----- Lipid panel 2 months ago showed direct LDL of 75 and we increased her atorvastatin to 80 mg daily.  However, lipid panel today shows LDL of 95.  Can we please confirm whether her lab work today was fasting?  Also confirm that she is taking atorvastatin 80 mg daily as prescribed?  Suspect either she is not taking medicine as prescribed or was not true fasting value.  If she has not been taking, educated to resume atorvastatin 80 mg daily.  If was not a fasting lab, we can [facility] for true fasting lipid pane with direct LDL or it can be collected at her upcoming appt with Dr. Saunders Revel. If she has been taking as prescribed and was fasting, let me know.

## 2020-09-20 ENCOUNTER — Telehealth: Payer: Self-pay | Admitting: Family Medicine

## 2020-09-20 ENCOUNTER — Emergency Department (HOSPITAL_COMMUNITY): Payer: Medicare Other

## 2020-09-20 ENCOUNTER — Emergency Department (HOSPITAL_COMMUNITY)
Admission: EM | Admit: 2020-09-20 | Discharge: 2020-09-20 | Disposition: A | Discharge status: Home / Self Care | Payer: Medicare Other | Attending: Emergency Medicine | Admitting: Emergency Medicine

## 2020-09-20 DIAGNOSIS — E039 Hypothyroidism, unspecified: Secondary | ICD-10-CM | POA: Insufficient documentation

## 2020-09-20 DIAGNOSIS — Z79899 Other long term (current) drug therapy: Secondary | ICD-10-CM | POA: Diagnosis not present

## 2020-09-20 DIAGNOSIS — Z8673 Personal history of transient ischemic attack (TIA), and cerebral infarction without residual deficits: Secondary | ICD-10-CM | POA: Diagnosis not present

## 2020-09-20 DIAGNOSIS — G510 Bell's palsy: Secondary | ICD-10-CM | POA: Insufficient documentation

## 2020-09-20 DIAGNOSIS — Z87891 Personal history of nicotine dependence: Secondary | ICD-10-CM | POA: Diagnosis not present

## 2020-09-20 DIAGNOSIS — Z7901 Long term (current) use of anticoagulants: Secondary | ICD-10-CM | POA: Insufficient documentation

## 2020-09-20 DIAGNOSIS — R29810 Facial weakness: Secondary | ICD-10-CM | POA: Diagnosis present

## 2020-09-20 DIAGNOSIS — N183 Chronic kidney disease, stage 3 unspecified: Secondary | ICD-10-CM | POA: Diagnosis not present

## 2020-09-20 DIAGNOSIS — I129 Hypertensive chronic kidney disease with stage 1 through stage 4 chronic kidney disease, or unspecified chronic kidney disease: Secondary | ICD-10-CM | POA: Diagnosis not present

## 2020-09-20 LAB — CBG MONITORING, ED: Glucose-Capillary: 87 mg/dL (ref 70–99)

## 2020-09-20 LAB — CBC
HCT: 37.5 % (ref 36.0–46.0)
Hemoglobin: 12.4 g/dL (ref 12.0–15.0)
MCH: 33.2 pg (ref 26.0–34.0)
MCHC: 33.1 g/dL (ref 30.0–36.0)
MCV: 100.5 fL — ABNORMAL HIGH (ref 80.0–100.0)
Platelets: 199 10*3/uL (ref 150–400)
RBC: 3.73 MIL/uL — ABNORMAL LOW (ref 3.87–5.11)
RDW: 12.5 % (ref 11.5–15.5)
WBC: 4.6 10*3/uL (ref 4.0–10.5)
nRBC: 0 % (ref 0.0–0.2)

## 2020-09-20 LAB — COMPREHENSIVE METABOLIC PANEL
ALT: 27 U/L (ref 0–44)
AST: 26 U/L (ref 15–41)
Albumin: 3.8 g/dL (ref 3.5–5.0)
Alkaline Phosphatase: 48 U/L (ref 38–126)
Anion gap: 9 (ref 5–15)
BUN: 17 mg/dL (ref 8–23)
CO2: 24 mmol/L (ref 22–32)
Calcium: 9.4 mg/dL (ref 8.9–10.3)
Chloride: 108 mmol/L (ref 98–111)
Creatinine, Ser: 1.25 mg/dL — ABNORMAL HIGH (ref 0.44–1.00)
GFR, Estimated: 44 mL/min — ABNORMAL LOW (ref >60)
Glucose, Bld: 104 mg/dL — ABNORMAL HIGH (ref 70–99)
Potassium: 4.4 mmol/L (ref 3.5–5.1)
Sodium: 141 mmol/L (ref 135–145)
Total Bilirubin: 1.2 mg/dL (ref 0.3–1.2)
Total Protein: 6.7 g/dL (ref 6.5–8.1)

## 2020-09-20 LAB — DIFFERENTIAL
Abs Immature Granulocytes: 0.01 10*3/uL (ref 0.00–0.07)
Basophils Absolute: 0 10*3/uL (ref 0.0–0.1)
Basophils Relative: 1 %
Eosinophils Absolute: 0.1 10*3/uL (ref 0.0–0.5)
Eosinophils Relative: 3 %
Immature Granulocytes: 0 %
Lymphocytes Relative: 20 %
Lymphs Abs: 0.9 10*3/uL (ref 0.7–4.0)
Monocytes Absolute: 0.5 10*3/uL (ref 0.1–1.0)
Monocytes Relative: 11 %
Neutro Abs: 3 10*3/uL (ref 1.7–7.7)
Neutrophils Relative %: 65 %

## 2020-09-20 LAB — I-STAT CHEM 8, ED
BUN: 23 mg/dL (ref 8–23)
Calcium, Ion: 1.13 mmol/L — ABNORMAL LOW (ref 1.15–1.40)
Chloride: 109 mmol/L (ref 98–111)
Creatinine, Ser: 1.2 mg/dL — ABNORMAL HIGH (ref 0.44–1.00)
Glucose, Bld: 98 mg/dL (ref 70–99)
HCT: 38 % (ref 36.0–46.0)
Hemoglobin: 12.9 g/dL (ref 12.0–15.0)
Potassium: 4.3 mmol/L (ref 3.5–5.1)
Sodium: 141 mmol/L (ref 135–145)
TCO2: 24 mmol/L (ref 22–32)

## 2020-09-20 LAB — C-REACTIVE PROTEIN: CRP: <0.5 mg/dL (ref <1.0)

## 2020-09-20 LAB — ETHANOL: Alcohol, Ethyl (B): <10 mg/dL (ref <10)

## 2020-09-20 LAB — APTT: aPTT: 24 seconds (ref 24–36)

## 2020-09-20 LAB — PROTIME-INR
INR: 1 (ref 0.8–1.2)
Prothrombin Time: 13.1 seconds (ref 11.4–15.2)

## 2020-09-20 LAB — SEDIMENTATION RATE: Sed Rate: 24 mm/hr — ABNORMAL HIGH (ref 0–22)

## 2020-09-20 MED ORDER — PREDNISONE 20 MG PO TABS: ORAL_TABLET | ORAL | 0 refills | Status: DC

## 2020-09-20 MED ORDER — PREDNISONE 20 MG PO TABS
60.0000 mg | ORAL_TABLET | Freq: Once | ORAL | Status: AC
  Administered 2020-09-20: 13:00:00 60 mg via ORAL
  Filled 2020-09-20: qty 3

## 2020-09-20 MED ORDER — TETRACAINE HCL 0.5 % OP SOLN
2.0000 [drp] | Freq: Once | OPHTHALMIC | Status: AC
  Administered 2020-09-20: 10:00:00 2 [drp] via OPHTHALMIC
  Filled 2020-09-20: qty 4

## 2020-09-20 MED ORDER — VALACYCLOVIR HCL 500 MG PO TABS
1000.0000 mg | ORAL_TABLET | Freq: Once | ORAL | Status: AC
  Administered 2020-09-20: 13:00:00 1000 mg via ORAL
  Filled 2020-09-20: qty 2

## 2020-09-20 MED ORDER — VALACYCLOVIR HCL 1 G PO TABS: 1000.0000 mg | ORAL_TABLET | Freq: Three times a day (TID) | ORAL | 0 refills | Status: DC

## 2020-09-20 MED ORDER — FLUORESCEIN SODIUM 1 MG OP STRP
1.0000 | ORAL_STRIP | Freq: Once | OPHTHALMIC | Status: AC
  Administered 2020-09-20: 10:00:00 1 via OPHTHALMIC
  Filled 2020-09-20: qty 1

## 2020-09-20 MED ORDER — ACETAMINOPHEN 325 MG PO TABS
650.0000 mg | ORAL_TABLET | Freq: Once | ORAL | Status: AC
  Administered 2020-09-20: 13:00:00 650 mg via ORAL
  Filled 2020-09-20: qty 2

## 2020-09-20 MED ORDER — ARTIFICIAL TEARS OPHTHALMIC OINT: TOPICAL_OINTMENT | Freq: Every evening | OPHTHALMIC | 0 refills | Status: DC | PRN

## 2020-09-20 NOTE — ED Triage Notes (Signed)
Pt to ED via EMS from home . Pt has a PCP apt yesterday at 0815 c/o HA and ear pain. Apparently has had these syptoms 2-3 days prior to apt. After her apt around 0900 she went to eat breakfast and was unable to drink from straw. Pt last known well around 0900 at breakfast. Right sided facial droop and slurred speech noted. No other deficit noted by EMS. # 20 RAC, no medications given by EMS. Last VS: 186/76, hr 78, 97%RA, CBG 122. Medical HX: Anxiety, CHF, GERD, HTN, TIA. E&UM3

## 2020-09-20 NOTE — ED Notes (Signed)
Pt daughter to bedside, informs that pt called her last night that her eye hurt ,swollen, burning and blood shot, that symptoms progressively gotten worse over the past couple of days.
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
## 2020-09-20 NOTE — Discharge Instructions (Addendum)
If you develop continued, recurrent, or worsening headache, fever, neck stiffness, vomiting, blurry or double vision, weakness or numbness in your arms or legs, trouble speaking, or any other new/concerning symptoms then return to the ER for evaluation.  

## 2020-09-20 NOTE — ED Notes (Signed)
Pt transported to MRI 

## 2020-09-20 NOTE — Telephone Encounter (Signed)
Patient's daughter, Juliann Pulse, called.  She said patient was seen by Jackelyn Poling yesterday and told she has shingles. Juliann Pulse said when [facility] came home yesterday, she was crying because she felt her concerns weren't heard.  Juliann Pulse said [facility] called [facility] yesterday and told her Jackelyn Poling asked them to look at patient's records.  They told her they looked at the records and agreed with Jackelyn Poling and if she had any further problems, to call her PCP. Juliann Pulse said she just put [facility] in an ambulance to go to the hospital with a possible stroke.  Juliann Pulse is very upset. When I asked for her phone number, she refused because she said no one wants to talk to her right now because she's too upset.

## 2020-09-20 NOTE — ED Notes (Signed)
Pt transported to CT ?

## 2020-09-20 NOTE — ED Notes (Signed)
Pt d/c home per MD order. Discharge summary reviewed, pt verbalizes understanding. Pt off unit via [place], Discharged home with daughter. No s/s of acute distress noted at discharge.

## 2020-09-21 NOTE — Telephone Encounter (Signed)
Pt called states she plans to come for repeat fasting labs 10/13/20 prior to her follow up appt 10/17/20.  Assured pt that will be fine for these labs.

## 2020-09-21 NOTE — Telephone Encounter (Signed)
Called and spoke with patient. She was not upset with me and did not have any issues with the care she received. She is doing well, starting meds today. Feels good. Is able to eat and drink without difficulty. Wearing eye patch for discomfort.

## 2020-09-24 ENCOUNTER — Telehealth: Payer: Self-pay | Admitting: Family Medicine

## 2020-09-24 NOTE — Telephone Encounter (Signed)
Patient called in stating she was seen in ER recently and was told to follow up with PCP. Please advise what you would like to have done as in paperwork she is told to follow up with opthalmology.

## 2020-09-26 NOTE — Telephone Encounter (Signed)
Called and spoke to patient, she is doing much better. Does not feel like she needs to be seen at this time. She has TOC appointment in early Feb. With Dr. Einar Pheasant and was reminded to call the office if she needs anything prior to her appointment.

## 2020-10-11 ENCOUNTER — Other Ambulatory Visit: Payer: Self-pay

## 2020-10-11 DIAGNOSIS — I10 Essential (primary) hypertension: Secondary | ICD-10-CM

## 2020-10-11 DIAGNOSIS — N183 Chronic kidney disease, stage 3 unspecified: Secondary | ICD-10-CM

## 2020-10-11 MED ORDER — BENAZEPRIL HCL 10 MG PO TABS: 10.0000 mg | ORAL_TABLET | Freq: Every day | ORAL | 1 refills | Status: DC

## 2020-10-11 NOTE — Progress Notes (Signed)
Pharmacy requests refill on: Benazepril HCL 10 mg   LAST REFILL: 03/01/2020 (Q-90, R-1) LAST OV: 09/19/2020 NEXT OV: 11/12/2020 PHARMACY: [id number] [place], [place]

## 2020-10-12 ENCOUNTER — Other Ambulatory Visit
Admission: RE | Admit: 2020-10-12 | Discharge: 2020-10-12 | Disposition: A | Discharge status: Home / Self Care | Payer: Medicare Other | Attending: Family | Admitting: Family

## 2020-10-12 DIAGNOSIS — E782 Mixed hyperlipidemia: Secondary | ICD-10-CM | POA: Insufficient documentation

## 2020-10-12 DIAGNOSIS — Z79899 Other long term (current) drug therapy: Secondary | ICD-10-CM | POA: Insufficient documentation

## 2020-10-12 LAB — LIPID PANEL
Cholesterol: 156 mg/dL (ref 0–200)
HDL: 59 mg/dL (ref >40)
LDL Cholesterol: 79 mg/dL (ref 0–99)
Total CHOL/HDL Ratio: 2.6 RATIO
Triglycerides: 90 mg/dL (ref <150)
VLDL: 18 mg/dL (ref 0–40)

## 2020-10-12 LAB — LDL CHOLESTEROL, DIRECT: Direct LDL: 82.4 mg/dL (ref 0–99)

## 2020-10-15 ENCOUNTER — Telehealth: Payer: Self-pay | Admitting: *Deleted

## 2020-10-15 DIAGNOSIS — E782 Mixed hyperlipidemia: Secondary | ICD-10-CM

## 2020-10-15 MED ORDER — EZETIMIBE 10 MG PO TABS: 10.0000 mg | ORAL_TABLET | Freq: Every day | ORAL | 3 refills | Status: DC

## 2020-10-15 NOTE — Telephone Encounter (Signed)
Spoke to pt, notified of lab results and recc. Pt verbalized understanding, she will start Zetia 10mg  daily and will have repeat fasting lipid panel [facility] in 2 months. Rx sent to pt's local pharmacy. Lab orders placed for medical mall.

## 2020-10-15 NOTE — Telephone Encounter (Signed)
-----   Message from Arvil Chaco, PA-C sent at 10/13/2020 12:10 AM EST ----- LDL still not at goal. Recommend start Zetia 10mg  daily and recheck of lipids again in 2 months to ensure at goal.

## 2020-10-17 ENCOUNTER — Encounter: Payer: Self-pay | Admitting: Internal Medicine

## 2020-10-17 ENCOUNTER — Other Ambulatory Visit: Payer: Self-pay

## 2020-10-17 ENCOUNTER — Ambulatory Visit (INDEPENDENT_AMBULATORY_CARE_PROVIDER_SITE_OTHER): Payer: Medicare Other | Admitting: Internal Medicine

## 2020-10-17 VITALS — BP 122/70 | HR 62 | Ht 63.0 in | Wt 156.0 lb

## 2020-10-17 DIAGNOSIS — G459 Transient cerebral ischemic attack, unspecified: Secondary | ICD-10-CM

## 2020-10-17 DIAGNOSIS — R001 Bradycardia, unspecified: Secondary | ICD-10-CM

## 2020-10-17 DIAGNOSIS — I4729 Other ventricular tachycardia: Secondary | ICD-10-CM

## 2020-10-17 DIAGNOSIS — I471 Supraventricular tachycardia: Secondary | ICD-10-CM

## 2020-10-17 DIAGNOSIS — I472 Ventricular tachycardia: Secondary | ICD-10-CM

## 2020-10-17 DIAGNOSIS — R079 Chest pain, unspecified: Secondary | ICD-10-CM

## 2020-10-17 DIAGNOSIS — E785 Hyperlipidemia, unspecified: Secondary | ICD-10-CM

## 2020-10-17 NOTE — Patient Instructions (Signed)

## 2020-10-17 NOTE — Progress Notes (Signed)
Follow-up Outpatient Visit Date: 10/17/2020  Primary Care Provider: Lesleigh Noe, [place] [address]  Chief Complaint: Follow-up shortness of breath and bradycardia  HPI:  Carla. Carla Lyons is a 80 y.o. female with history of hypertension, hyperlipidemia, TIA, chronic kidney disease stage III, hypothyroidism, GERD, anxiety disorder, and degenerative joint disease, who presents for follow-up of bradycardia.  I met Carla Lyons in July for urgent evaluation of bradycardia accompanied by exertional dyspnea and fatigue.  EKG showed sinus bradycardia in the upper 40s, which was not a new finding for her.  She also complained of calf swelling and shortness of breath.  DVT was elevated but subsequent CTA of the chest without evidence of PE.  Large hiatal hernia was noted.  Subsequent myocardial perfusion stress test was low risk without evidence of ischemia.  Echo showed normal LVEF with moderate LVH, grade 1 diastolic dysfunction, and no significant valvular abnormality.  Event monitors showed sinus rhythm with rare PACs and PVCs as well as multiple brief episodes of PSVT and a few runs of NSVT.  Average heart rates were in the mid 60s.  There is no evidence of atrial fibrillation.  Today, Carla Lyons reports that she is feeling relatively well.  She recently had shingles involving her right ear complicated by Bell's palsy.  She notes that her speech and facial weakness has just returned back to normal.  She feels relatively well from a heart standpoint though she notes occasional funny feelings in her chest radiating to her back.  These happen randomly and only last a few seconds.  She has not had any frank chest pain nor shortness of breath, palpitations, or lightheadedness.  Carla. needs notes a slight dull headache the last few days following titration of her blood pressure medications by her PCP.  She also just started taking ezetimibe based on results of recent lipid panel, at the  recommendation of Marrianne Mood, [place], in our office.   --------------------------------------------------------------------------------------------------  Past Medical History:  Diagnosis Date  . Allergy   . 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 Cataract    REMOVED  . 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 Colon polyps 06-15-07  . Diverticulosis    hx of  . Hyperlipidemia   . Hypertension   . Iron deficiency anemia due to chronic blood loss 12/06/2019  . Large hiatal hernia 12/06/2019  . Osteoporosis   . Personal history of colonic adenomas 07/19/2007  . Renal insufficiency   . Thyroid disease   . TIA (transient ischemic attack)    Past Surgical History:  Procedure Laterality Date  . APPENDECTOMY    . CATARACT EXTRACTION W/ INTRAOCULAR LENS IMPLANT Right 01/08/2018  . CATARACT EXTRACTION W/ INTRAOCULAR LENS IMPLANT Left 02/05/2018  . CESAREAN SECTION     x 2  . COLONOSCOPY  11/17/2018  . ESOPHAGOGASTRODUODENOSCOPY ENDOSCOPY    . PARTIAL HYSTERECTOMY  age 40  . VAGINAL HYSTERECTOMY  age 80   partial    Current Meds  Medication Sig  . ALPRAZolam (XANAX) 0.25 MG tablet Take 1 tablet (0.25 mg total) by mouth at bedtime as needed for anxiety.  Marland Kitchen amLODipine (NORVASC) 5 MG tablet Take 1 tablet (5 mg total) by mouth daily.  Marland Kitchen atorvastatin (LIPITOR) 80 MG tablet Take 1 tablet (80 mg total) by mouth daily.  Marland Kitchen b complex vitamins tablet Take 1 tablet by mouth daily.  . benazepril (LOTENSIN) 10 MG tablet Take 1 tablet (10  mg total) by mouth daily.  Marland Kitchen BIOTIN 5000 PO Take 10,000 mcg by mouth daily.  . butalbital-acetaminophen-caffeine (FIORICET) 50-325-40 MG tablet Take 1-2 tablets by mouth every 6 (six) hours as needed for headache. Do not take more than 2 times per week given possibility of rebound headaches.  . cholecalciferol (VITAMIN D) 1000 UNITS tablet Take 1,000 Units by mouth daily.   . clopidogrel (PLAVIX) 75 MG tablet Take 1 tablet (75 mg  total) by mouth daily.  . Cyanocobalamin 1000 MCG SUBL Place 1 tablet under the tongue daily.  Marland Kitchen ezetimibe (ZETIA) 10 MG tablet Take 1 tablet (10 mg total) by mouth daily.  . ferrous sulfate 325 (65 FE) MG tablet Take 325 mg by mouth at bedtime.  . furosemide (LASIX) 20 MG tablet Take 20 mg by mouth as needed for edema.  . hydrochlorothiazide (HYDRODIURIL) 12.5 MG tablet Take 12.5 mg by mouth daily.   Marland Kitchen levothyroxine (SYNTHROID) 88 MCG tablet Take 1 tablet (88 mcg total) by mouth daily before breakfast.  . Melatonin 3 MG TABS Take 3 mg by mouth at bedtime as needed (for sleep).   . Omega-3 Fatty Acids (FISH OIL) 1000 MG CAPS Take 1,000 mg by mouth in the morning and at bedtime.   Marland Kitchen omeprazole (PRILOSEC) 40 MG capsule Take 1 capsule (40 mg total) by mouth daily before breakfast.  . predniSONE (DELTASONE) 20 MG tablet 3 tabs po daily x 2 days, then 2 tabs x 3 days, then 1.5 tabs x 3 days, then 1 tab x 3 days, then 0.5 tabs x 3 days  . valACYclovir (VALTREX) 1000 MG tablet Take 1 tablet (1,000 mg total) by mouth 3 (three) times daily.    Allergies: Fosamax [alendronate sodium], Niacin and related, and Codeine phosphate  Social History   Tobacco Use  . Smoking status: Former Smoker    Types: Cigarettes    Quit date: 10/07/2001    Years since quitting: 19.0  . Smokeless tobacco: Never Used  Vaping Use  . Vaping Use: Never used  Substance Use Topics  . Alcohol use: Not Currently  . Drug use: No    Family History  Problem Relation Age of Onset  . Coronary artery disease Mother   . Other Mother        bypass surgery  . Dementia Mother   . Osteoarthritis Father   . Heart disease Father   . Heart attack Maternal Grandfather   . Lung disease Brother   . Hypertension Brother   . Lung disease Sister   . Lung disease Sister   . Colon cancer Neg Hx   . Esophageal cancer Neg Hx   . Rectal cancer Neg Hx   . Stomach cancer Neg Hx   . Colon polyps Neg Hx     Review of Systems: A  12-system review of systems was performed and was negative except as noted in the HPI.  --------------------------------------------------------------------------------------------------  Physical Exam: BP 122/70   Pulse 62   Ht $R'5\' 3"'aF$  (1.6 m)   Wt 156 lb (70.8 kg)   BMI 27.63 kg/m   General:  NAD. Neck: No JVD or HJR. Lungs: Clear to auscultation bilaterally without wheezes or crackles. Heart: Regular rate and rhythm without murmurs, rubs, or gallops. Abdomen: Soft, nontender, nondistended. Extremities: No lower extremity edema.  EKG (09/20/2020): Normal sinus rhythm with poor R wave progression that may reflect lead placement.  Otherwise, no significant change from prior tracing in 05/2020.  Lab Results  Component Value Date  WBC 4.6 09/20/2020   HGB 12.9 09/20/2020   HCT 38.0 09/20/2020   MCV 100.5 (H) 09/20/2020   PLT 199 09/20/2020    Lab Results  Component Value Date   NA 141 09/20/2020   K 4.3 09/20/2020   CL 109 09/20/2020   CO2 24 09/20/2020   BUN 23 09/20/2020   CREATININE 1.20 (H) 09/20/2020   GLUCOSE 98 09/20/2020   ALT 27 09/20/2020    Lab Results  Component Value Date   CHOL 156 10/12/2020   HDL 59 10/12/2020   LDLCALC 79 10/12/2020   LDLDIRECT 82.4 10/12/2020   TRIG 90 10/12/2020   CHOLHDL 2.6 10/12/2020    --------------------------------------------------------------------------------------------------  ASSESSMENT AND PLAN: TIA: Patient with recent concern for stroke but ultimately diagnosed with Bell's palsy.  Facial weakness has now resolved.  Prior event monitor showed no evidence of atrial fibrillation/flutter.  I recommend continuation of aspirin and atorvastatin for secondary prevention as well as recent addition of ezetimibe to target LDL less than 70.  Bradycardia: Heart rate is normal today without any symptoms to suggest symptomatic bradycardia.  Continue to avoid rate controlling agents such as beta-blockers and nondihydropyridine  calcium channel blockers.  No further work-up recommended at this time.  Chest pain: Carla Lyons reports sporadic episodes of a "funny" feeling in her chest radiating to the back lasting only a few seconds.  She does not have any exertional pain.  Myocardial perfusion stress test in 04/2020 was low risk without evidence of ischemia or scar though coronary artery calcification and aortic atherosclerosis were noted.  Patient also has a known large hiatal hernia that may be contributing to her symptoms.  We will defer additional testing and intervention at this time.  PSVT and NSVT: Incidentally noted on event monitors last year during work-up of TIA.  Episodes were brief and self-limited.  Carla Lyons denies significant symptoms.  Given history of bradycardia, I think it is best to defer addition of a beta-blocker or calcium channel blocker.  Prior echo and myocardial perfusion stress test were reassuring.  No further work-up or intervention is recommended unless symptoms develop.  Hyperlipidemia: LDL above goal on recent check prompting addition of ezetimibe 10 mg daily to atorvastatin 80 mg daily.  We will plan for follow-up lipid panel in about 2 months to ensure LDL is below 70.  Lifestyle modifications were reinforced.  Follow-up: Return to clinic in 6 months.  Nelva Bush, MD 10/17/2020 4:15 PM

## 2020-10-18 ENCOUNTER — Encounter: Payer: Self-pay | Admitting: Internal Medicine

## 2020-10-18 DIAGNOSIS — I472 Ventricular tachycardia: Secondary | ICD-10-CM | POA: Insufficient documentation

## 2020-10-18 DIAGNOSIS — I4729 Other ventricular tachycardia: Secondary | ICD-10-CM | POA: Insufficient documentation

## 2020-10-18 DIAGNOSIS — I471 Supraventricular tachycardia: Secondary | ICD-10-CM | POA: Insufficient documentation

## 2020-10-23 ENCOUNTER — Other Ambulatory Visit: Payer: Self-pay | Admitting: Internal Medicine

## 2020-10-25 ENCOUNTER — Other Ambulatory Visit: Payer: Self-pay | Admitting: Family

## 2020-10-25 DIAGNOSIS — E782 Mixed hyperlipidemia: Secondary | ICD-10-CM

## 2020-10-25 NOTE — Telephone Encounter (Signed)
Rx request sent to pharmacy.  

## 2020-11-12 ENCOUNTER — Encounter: Payer: Medicare Other | Admitting: Family Medicine

## 2020-11-12 ENCOUNTER — Telehealth: Payer: Self-pay

## 2020-11-12 NOTE — Telephone Encounter (Signed)
[facility] [facility] Night - Client Nonclinical Telephone Record AccessNurse Client [facility] Night - Client Client Site [place] Physician Waunita Schooner- MD Contact Type Call Who Is Calling Patient / Member / Family / Caregiver Caller Name Vincent Phone Number [phone] Patient Name Carla Lyons Patient DOB Dec 07, 1940 Call Type Message Only Information Provided Reason for Call Request to Reschedule Office Appointment Initial Comment Caller states she will need to cancel appt she has today at 840am and will not drive in inclement weather. Additional Comment Caller declined triage. Provided caller with office hours. Disp. Time Disposition Final User 11/12/2020 7:18:20 AM General Information Provided Yes Harrie Foreman Call Closed By: Harrie Foreman Transaction Date/Time: 11/12/2020 7:14:38 AM (ET)

## 2020-12-13 ENCOUNTER — Other Ambulatory Visit
Admission: RE | Admit: 2020-12-13 | Discharge: 2020-12-13 | Disposition: A | Discharge status: Home / Self Care | Payer: Medicare Other | Source: Ambulatory Visit | Attending: Physician Assistant | Admitting: Physician Assistant

## 2020-12-13 DIAGNOSIS — E782 Mixed hyperlipidemia: Secondary | ICD-10-CM | POA: Insufficient documentation

## 2020-12-13 LAB — LIPID PANEL
Cholesterol: 138 mg/dL (ref 0–200)
HDL: 61 mg/dL (ref >40)
LDL Cholesterol: 65 mg/dL (ref 0–99)
Total CHOL/HDL Ratio: 2.3 RATIO
Triglycerides: 61 mg/dL (ref <150)
VLDL: 12 mg/dL (ref 0–40)

## 2021-01-08 ENCOUNTER — Encounter: Payer: Self-pay | Admitting: Family Medicine

## 2021-01-08 ENCOUNTER — Ambulatory Visit (INDEPENDENT_AMBULATORY_CARE_PROVIDER_SITE_OTHER): Payer: Medicare Other | Admitting: Family Medicine

## 2021-01-08 ENCOUNTER — Other Ambulatory Visit: Payer: Self-pay

## 2021-01-08 VITALS — BP 118/62 | HR 57 | Temp 97.3°F | Ht 63.0 in | Wt 159.0 lb

## 2021-01-08 DIAGNOSIS — D631 Anemia in chronic kidney disease: Secondary | ICD-10-CM

## 2021-01-08 DIAGNOSIS — K219 Gastro-esophageal reflux disease without esophagitis: Secondary | ICD-10-CM | POA: Diagnosis not present

## 2021-01-08 DIAGNOSIS — B002 Herpesviral gingivostomatitis and pharyngotonsillitis: Secondary | ICD-10-CM | POA: Diagnosis not present

## 2021-01-08 DIAGNOSIS — E785 Hyperlipidemia, unspecified: Secondary | ICD-10-CM

## 2021-01-08 DIAGNOSIS — G4700 Insomnia, unspecified: Secondary | ICD-10-CM

## 2021-01-08 DIAGNOSIS — E039 Hypothyroidism, unspecified: Secondary | ICD-10-CM

## 2021-01-08 DIAGNOSIS — I10 Essential (primary) hypertension: Secondary | ICD-10-CM

## 2021-01-08 DIAGNOSIS — F4320 Adjustment disorder, unspecified: Secondary | ICD-10-CM | POA: Insufficient documentation

## 2021-01-08 DIAGNOSIS — F4329 Adjustment disorder with other symptoms: Secondary | ICD-10-CM

## 2021-01-08 DIAGNOSIS — M810 Age-related osteoporosis without current pathological fracture: Secondary | ICD-10-CM

## 2021-01-08 DIAGNOSIS — Z8673 Personal history of transient ischemic attack (TIA), and cerebral infarction without residual deficits: Secondary | ICD-10-CM

## 2021-01-08 DIAGNOSIS — N1832 Chronic kidney disease, stage 3b: Secondary | ICD-10-CM

## 2021-01-08 NOTE — Assessment & Plan Note (Signed)
Following with Dr. Juleen China. Taking lasix prn for left leg edema

## 2021-01-08 NOTE — Assessment & Plan Note (Addendum)
Taking vit D and calcium. No recent Dexa. She will consider getting and reassess medication

## 2021-01-08 NOTE — Progress Notes (Signed)
Subjective:     Carla Lyons is a 80 y.o. female presenting for Transitions Of Care     HPI  #family stress - sister with end stage lung disease - helps care for her - covid has been hard - pulmonary fibrosis  - feels this is something she needs to deal with  Taking medications as prescribed no concerns or side effects Feels well   Review of Systems   Social History   Tobacco Use  Smoking Status Former Smoker  . Packs/day: 0.25  . Years: 15.00  . Pack years: 3.75  . Types: Cigarettes  . Quit date: 10/07/2001  . Years since quitting: 19.2  Smokeless Tobacco Never Used        Objective:    BP Readings from Last 3 Encounters:  01/08/21 118/62  10/17/20 122/70  09/20/20 (!) 150/78   Wt Readings from Last 3 Encounters:  01/08/21 159 lb (72.1 kg)  10/17/20 156 lb (70.8 kg)  09/20/20 153 lb 12.8 oz (69.8 kg)    BP 118/62   Pulse (!) 57   Temp (!) 97.3 F (36.3 C) (Temporal)   Ht 5\' 3"  (1.6 m)   Wt 159 lb (72.1 kg)   SpO2 98%   BMI 28.17 kg/m    Physical Exam Constitutional:      General: She is not in acute distress.    Appearance: She is well-developed. She is not diaphoretic.  HENT:     Right Ear: External ear normal.     Left Ear: External ear normal.     Nose: Nose normal.  Eyes:     Conjunctiva/sclera: Conjunctivae normal.  Cardiovascular:     Rate and Rhythm: Normal rate and regular rhythm.     Heart sounds: Murmur heard.    Pulmonary:     Effort: Pulmonary effort is normal. No respiratory distress.     Breath sounds: Normal breath sounds. No wheezing.  Musculoskeletal:     Cervical back: Neck supple.  Skin:    General: Skin is warm and dry.     Capillary Refill: Capillary refill takes less than 2 seconds.  Neurological:     Mental Status: She is alert. Mental status is at baseline.  Psychiatric:        Mood and Affect: Mood normal. Affect is tearful (when discussing sister).        Behavior: Behavior normal.      Depression  screen [facility] 2/9 01/08/2021 09/19/2020 04/18/2020  Decreased Interest 0 0 0  Down, Depressed, Hopeless 0 0 0  PHQ - 2 Score 0 0 0  Altered sleeping - - 1  Tired, decreased energy - - 0  Change in appetite - - 0  Feeling bad or failure about yourself  - - 0  Trouble concentrating - - 0  Moving slowly or fidgety/restless - - 0  Suicidal thoughts - - 0  PHQ-9 Score - - 1  Difficult doing work/chores - - Not difficult at all  Some recent data might be hidden        Assessment & Plan:   Problem List Items Addressed This Visit      Cardiovascular and Mediastinum   Essential hypertension    Well controlled. Follows with Dr. Saunders Revel. Taking hctz 12.5 mg, amlodipine 5 mg, and benazepril 10 mg.         Digestive   GERD without esophagitis - Primary    Controlled. Cont prilosec 40 mg      Oral herpes  Responds to prn valtrex.         Endocrine   Hypothyroidism    Stable. Cont levothyroxine 88 mcg Lab Results  Component Value Date   TSH 1.246 04/18/2020           Musculoskeletal and Integument   Osteoporosis    Taking vit D and calcium. No recent Dexa. She will consider getting and reassess medication        Genitourinary   CKD (chronic kidney disease), stage III ([place])    Following with Dr. Juleen China. Taking lasix prn for left leg edema        Other   Hyperlipidemia LDL goal <70    Well controlled. On zetia 10 mg and atorvastatin 80 mg.       Anemia in chronic renal disease    Taking daily iron supplement.       History of TIA (transient ischemic attack)    On plavix 75 mg      Insomnia    Responds to rare use of 1/2 xanax 0.25 mg      Adjustment disorder    Pt tearful regarding her sisters health state with worsening lung disease. Also emotional about things since covid with general feeling more depressed. Declined therapy today. Not interested in any support today. Will cont to monitor.           Return in about 4 months (around 05/10/2021).  Lesleigh Noe, MD  This visit occurred during the SARS-CoV-2 public health emergency.  Safety protocols were in place, including screening questions prior to the visit, additional usage of staff PPE, and extensive cleaning of exam room while observing appropriate contact time as indicated for disinfecting solutions.

## 2021-01-08 NOTE — Assessment & Plan Note (Signed)
Stable. Cont levothyroxine 88 mcg Lab Results  Component Value Date   TSH 1.246 04/18/2020

## 2021-01-08 NOTE — Assessment & Plan Note (Signed)
Well controlled. On zetia 10 mg and atorvastatin 80 mg.

## 2021-01-08 NOTE — Assessment & Plan Note (Signed)
Controlled. Cont prilosec 40 mg

## 2021-01-08 NOTE — Assessment & Plan Note (Signed)
Taking daily iron supplement.

## 2021-01-08 NOTE — Assessment & Plan Note (Signed)
On plavix 75 mg

## 2021-01-08 NOTE — Assessment & Plan Note (Signed)
Well controlled. Follows with Dr. Saunders Revel. Taking hctz 12.5 mg, amlodipine 5 mg, and benazepril 10 mg.

## 2021-01-08 NOTE — Assessment & Plan Note (Signed)
Responds to prn valtrex.

## 2021-01-08 NOTE — Assessment & Plan Note (Signed)
Responds to rare use of 1/2 xanax 0.25 mg

## 2021-01-08 NOTE — Assessment & Plan Note (Signed)
Pt tearful regarding her sisters health state with worsening lung disease. Also emotional about things since covid with general feeling more depressed. Declined therapy today. Not interested in any support today. Will cont to monitor.

## 2021-01-08 NOTE — Patient Instructions (Addendum)
If you feel you want help with your mood return and we can discuss medications or therapy  Return for annual wellness

## 2021-01-16 ENCOUNTER — Ambulatory Visit: Payer: Medicare Other | Admitting: Family Medicine

## 2021-01-30 ENCOUNTER — Encounter: Payer: Self-pay | Admitting: Family Medicine

## 2021-01-30 ENCOUNTER — Other Ambulatory Visit: Payer: Self-pay

## 2021-01-30 ENCOUNTER — Ambulatory Visit (INDEPENDENT_AMBULATORY_CARE_PROVIDER_SITE_OTHER): Payer: Medicare Other | Admitting: Family Medicine

## 2021-01-30 VITALS — BP 126/78 | HR 63 | Temp 97.3°F | Ht 63.0 in | Wt 159.0 lb

## 2021-01-30 DIAGNOSIS — M7502 Adhesive capsulitis of left shoulder: Secondary | ICD-10-CM | POA: Diagnosis not present

## 2021-01-30 DIAGNOSIS — G8929 Other chronic pain: Secondary | ICD-10-CM

## 2021-01-30 DIAGNOSIS — M25512 Pain in left shoulder: Secondary | ICD-10-CM
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
## 2021-01-30 NOTE — Patient Instructions (Signed)
Voltaren 1% gel. Over the counter You can apply up to 4 times a day Minimal is absorbed in the bloodstream Cost is about 9 dollars  Try either some Salonpas Patches or 4% cream Get the generic 4% lidocaine - equivalent

## 2021-01-31 ENCOUNTER — Encounter: Payer: Self-pay | Admitting: Family Medicine

## 2021-02-11 ENCOUNTER — Other Ambulatory Visit: Payer: Self-pay

## 2021-02-11 ENCOUNTER — Ambulatory Visit (INDEPENDENT_AMBULATORY_CARE_PROVIDER_SITE_OTHER): Payer: Medicare Other | Admitting: Adult Health

## 2021-02-11 ENCOUNTER — Encounter: Payer: Self-pay | Admitting: Adult Health

## 2021-02-11 VITALS — BP 120/62 | HR 48 | Ht 63.0 in | Wt 158.0 lb

## 2021-02-11 DIAGNOSIS — G459 Transient cerebral ischemic attack, unspecified: Secondary | ICD-10-CM | POA: Diagnosis not present

## 2021-02-11 DIAGNOSIS — E785 Hyperlipidemia, unspecified: Secondary | ICD-10-CM | POA: Diagnosis not present

## 2021-02-11 DIAGNOSIS — I10 Essential (primary) hypertension: Secondary | ICD-10-CM

## 2021-02-11 DIAGNOSIS — G510 Bell's palsy: Secondary | ICD-10-CM

## 2021-02-11 DIAGNOSIS — R001 Bradycardia, unspecified: Secondary | ICD-10-CM
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
## 2021-02-11 NOTE — Progress Notes (Signed)
I agree with the above plan 

## 2021-02-11 NOTE — Patient Instructions (Addendum)
Continue clopidogrel 75 mg daily  and atorvastatin for secondary stroke prevention  Continue to follow up with PCP regarding cholesterol and blood pressure management  Maintain strict control of hypertension with blood pressure goal below 130/90 and cholesterol with LDL cholesterol (bad cholesterol) goal below 70 mg/dL.       Thank you for coming to see [place] at [facility]. I hope we have been able to provide you high quality care today.  You may receive a patient satisfaction survey over the next few weeks. We would appreciate your feedback and comments so that we may continue to improve ourselves and the health of our patients.      Bell's Palsy, Adult  Bell's palsy is a short-term inability to move muscles in a part of the face. The inability to move, also called paralysis, results from inflammation or compression of the seventh cranial nerve. This nerve travels along the skull and under the ear to the side of the face. This nerve is responsible for facial movements that include blinking, closing the eyes, smiling, and frowning. What are the causes? The exact cause of this condition is not known. It may be caused by an infection from a virus, such as the chickenpox (herpes zoster), Epstein-Barr, or mumps virus. What increases the risk? You are more likely to develop this condition if:  You are pregnant.  You have diabetes.  You have had a recent infection in your nose, throat, or airways.  You have a weakened body defense system (immune system).  You have had a facial injury, such as a fracture.  You have a family history of Bell's palsy. What are the signs or symptoms? Symptoms of this condition include:  Weakness on one side of the face.  Drooping eyelid and corner of the mouth.  Excessive tearing in one eye.  Difficulty closing the eyelid.  Dry eye.  Drooling.  Dry mouth.  Changes in taste.  Change in facial appearance.  Pain behind one  ear.  Ringing in one or both ears.  Sensitivity to sound in one ear.  Facial twitching.  Headache.  Impaired speech.  Dizziness.  Difficulty eating or drinking. Most of the time, only one side of the face is affected. In rare cases, Bell's palsy may affect the whole face. How is this diagnosed? This condition is diagnosed based on:  Your symptoms.  Your medical history.  A physical exam. You may also have to see health care providers who specialize in disorders of the nerves (neurologist) or diseases and conditions of the eye (ophthalmologist). You may have tests, such as:  A test to check for nerve damage (electromyogram).  Imaging studies, such as a CT scan or an MRI.  Blood tests. How is this treated? This condition affects every person differently. Sometimes symptoms go away without treatment within a couple weeks. If treatment is needed, it varies from person to person. The goal of treatment is to reduce inflammation and protect the eye from damage. Treatment for Bell's palsy may include:  Medicines, such as: ? Steroids to reduce swelling and inflammation. ? Antiviral medicines. ? Pain relievers, including aspirin, acetaminophen, or ibuprofen.  Eye drops or ointment to keep your eye moist.  Eye protection, if you cannot close your eye.  Exercises or massage to regain muscle strength and function (physical therapy). Follow these instructions at home:  Take over-the-counter and prescription medicines only as told by your health care provider.  If your eye is affected: ? Keep your  eye moist with eye drops or ointment as told by your health care provider. ? Follow instructions for eye care and protection as told by your health care provider.  Do any physical therapy exercises as told by your health care provider.  Keep all follow-up visits. This is important.   Contact a health care provider if:  You have a fever or chills.  Your symptoms do not get better  within 2-3 weeks, or your symptoms get worse.  Your eye is red, irritated, or painful.  You have new symptoms. Get help right away if:  You have weakness or numbness in a part of your body other than your face.  You have trouble swallowing.  You develop neck pain or stiffness.  You develop dizziness or shortness of breath. Summary  Bell's palsy is a short-term inability to move muscles in a part of the face. The inability to move results from inflammation or compression of the facial nerve.  This condition affects every person differently. Sometimes symptoms go away without treatment within a couple weeks.  If treatment is needed, it varies from person to person. The goal of treatment is to reduce inflammation and protect the eye from damage.  Contact your health care provider if your symptoms do not get better within 2-3 weeks, or your symptoms get worse. This information is not intended to replace advice given to you by your health care provider. Make sure you discuss any questions you have with your health care provider. Document Revised: 06/21/2020 Document Reviewed: 06/21/2020 Elsevier Patient Education  2021 [employer].

## 2021-02-13 ENCOUNTER — Ambulatory Visit
Admission: RE | Admit: 2021-02-13 | Discharge: 2021-02-13 | Disposition: A | Discharge status: Home / Self Care | Payer: Medicare Other | Source: Ambulatory Visit | Attending: Family Medicine | Admitting: Family Medicine

## 2021-02-13 ENCOUNTER — Other Ambulatory Visit: Payer: Self-pay

## 2021-02-13 DIAGNOSIS — M7502 Adhesive capsulitis of left shoulder: Secondary | ICD-10-CM | POA: Diagnosis present

## 2021-02-13 DIAGNOSIS — M25512 Pain in left shoulder: Secondary | ICD-10-CM | POA: Insufficient documentation

## 2021-02-13 DIAGNOSIS — G8929 Other chronic pain: Secondary | ICD-10-CM | POA: Insufficient documentation

## 2021-02-15 ENCOUNTER — Telehealth: Payer: Self-pay | Admitting: Family Medicine

## 2021-02-15 MED ORDER — TRAMADOL HCL 50 MG PO TABS: 50.0000 mg | ORAL_TABLET | Freq: Three times a day (TID) | ORAL | 0 refills | Status: DC | PRN

## 2021-02-15 NOTE — Telephone Encounter (Signed)
We talked on the phone quite a bit right now, and she is doing ok.  Able to do gardening, housework, and take care of her sister in a wheelchair without much problems. (primary caretaker)  Pain is mostly at night.  She is taking Tylenol 1,000 mg before bed, but she still has some.  I am going to give her some tramadol to take at bedtime prn pain.   She wants to hold off on RTC repair with age and function now.  She will likely have some pain and decreased strength in terminal abduction indefinitely.    Meds ordered this encounter  Medications  . traMADol (ULTRAM) 50 MG tablet    Sig: Take 1 tablet (50 mg total) by mouth every 8 (eight) hours as needed for moderate pain.    Dispense:  20 tablet    Refill:  0     MR Shoulder Left Wo Contrast  Result Date: 02/14/2021 CLINICAL DATA:  Left shoulder pain and severely limited range of motion. No known injury. EXAM: MRI OF THE LEFT SHOULDER WITHOUT CONTRAST TECHNIQUE: Multiplanar, multisequence MR imaging of the shoulder was performed. No intravenous contrast was administered. COMPARISON:  None. FINDINGS: Rotator cuff: Rotator cuff tendinopathy is worst in the supraspinatus. There is a near full-thickness tear of the far lateral supraspinatus measuring 1.6 cm from front to back. Only a thin strand of tendon is intact. Retraction is 2.5-3.5 cm. Muscles:  No atrophy or focal lesion. Biceps long head:  Intact. Acromioclavicular Joint: Moderate osteoarthritis. Type 2 acromion. A moderate volume of fluid is seen in the subacromial/subdeltoid bursa. Glenohumeral Joint: Negative. Labrum:  Intact. Bones:  No fracture focal lesion. Other: None. IMPRESSION: Rotator cuff tendinopathy with a 1.6 cm from front to back near full-thickness tear of the anterior supraspinatus. Only a thin strand of intact tendon is present. Retraction is 2.5-3.5 cm. No atrophy. Moderate acromioclavicular osteoarthritis. Moderate volume of subacromial/subdeltoid fluid consistent with  bursitis. Electronically Signed   By: Inge Rise M.D.   On: 02/14/2021 12:09

## 2021-02-15 NOTE — Telephone Encounter (Signed)
Appreciate the update and thank you for caring for her.

## 2021-02-20 ENCOUNTER — Other Ambulatory Visit: Payer: Self-pay

## 2021-02-20 DIAGNOSIS — F5104 Psychophysiologic insomnia: Secondary | ICD-10-CM

## 2021-02-20 MED ORDER — ALPRAZOLAM 0.25 MG PO TABS
0.2500 mg | ORAL_TABLET | Freq: Every evening | ORAL | 1 refills | Status: AC | PRN
Start: 2021-02-20 — End: ?

## 2021-02-20 NOTE — Telephone Encounter (Signed)
Pharmacy requests refill on: Alprazolam 0.25 mg  LAST REFILL: 07/18/2020 LAST OV: 01/08/2021 NEXT OV: 05/15/2021 PHARMACY: [id number] [place], [place]

## 2021-03-05 NOTE — Progress Notes (Signed)
Amere Bricco T. Ascher Schroepfer, MD, [place]  Primary Care and [place] at [facility] [place], [address]  Phone: [phone]  FAX: [phone]  Carla Lyons - 80 y.o. female  MRN [id number]  Date of Birth: 05/03/1941  Date: 03/06/2021  PCP: Lesleigh Noe, MD  Referral: Lesleigh Noe, MD  Chief Complaint  Patient presents with  . joint injection  . Shoulder Pain    This visit occurred during the SARS-CoV-2 public health emergency.  Safety protocols were in place, including screening questions prior to the visit, additional usage of staff PPE, and extensive cleaning of exam room while observing appropriate contact time as indicated for disinfecting solutions.   Subjective:   Carla Lyons is a 80 y.o. very pleasant female patient with Body mass index is 27.81 kg/m. who presents with the following:  She is here to follow-up about her left shoulder there is a been chronic in character.  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  Our last office visit, I was worried that she had a torn rotator cuff, and she does have a near full-thickness rotator cuff tear on the left.  We talked about this on the telephone, and she is doing somewhat better, so she would not consider any operative intervention at this point.  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  She is here to talk about all of these things, and she contacted me about getting a shoulder injection as well.  MRI review, discussion of non-op management of rotator cuff tears. Pain at night, aside from this, the patient is really relatively pain-free and strong during the day.  Sub ac and intra inj, L shoulder Lidocaine patch at night  Review of Systems is noted in the HPI, as appropriate   Objective:   BP 128/70   Pulse 60   Temp (!)  97.5 F (36.4 C) (Temporal)   Ht 5\' 3"  (1.6 m)   Wt 157 lb (71.2 kg)   SpO2 98%   BMI 27.81 kg/m    Shoulder: L Inspection: No muscle wasting or winging Ecchymosis/edema: neg  AC joint, scapula, clavicle: NT Cervical spine: NT, full ROM Spurling's: neg Abduction: She lacks about 25 degrees of motion, 4+/5 Flexion: full, 5/5 IR, full, lift-off: 5/5 ER at neutral: full, 5/5 AC crossover and compression: neg Hawkins: neg Drop Test: neg Empty Can: neg Supraspinatus insertion: NT Bicipital groove: NT Speed's: neg Scapular dyskinesis: none C5-T1 intact Sensation intact Grip 5/5   Radiology: MR Shoulder Left Wo Contrast  Result Date: 02/14/2021 CLINICAL DATA:  Left shoulder pain and severely limited range of motion. No known injury. EXAM: MRI OF THE LEFT SHOULDER WITHOUT CONTRAST TECHNIQUE: Multiplanar, multisequence MR imaging of the shoulder was performed. No intravenous contrast was administered. COMPARISON:  None. FINDINGS: Rotator cuff: Rotator cuff tendinopathy is worst in the supraspinatus. There is a near full-thickness tear of the far lateral supraspinatus measuring 1.6 cm from front to back. Only a thin strand of tendon is intact. Retraction is 2.5-3.5 cm. Muscles:  No atrophy or focal lesion. Biceps long head:  Intact. Acromioclavicular Joint: Moderate osteoarthritis. Type 2 acromion. A moderate volume of fluid is seen in the subacromial/subdeltoid bursa. Glenohumeral Joint: Negative. Labrum:  Intact. Bones:  No fracture focal lesion. Other: None. IMPRESSION: Rotator cuff tendinopathy with  a 1.6 cm from front to back near full-thickness tear of the anterior supraspinatus. Only a thin strand of intact tendon is present. Retraction is 2.5-3.5 cm. No atrophy. Moderate acromioclavicular osteoarthritis. Moderate volume of subacromial/subdeltoid fluid consistent with bursitis. Electronically Signed   By: Inge Rise M.D.   On: 02/14/2021 12:09    Assessment and Plan:      ICD-10-CM   1. Traumatic incomplete tear of left rotator cuff, initial encounter  S46.012A triamcinolone acetonide (KENALOG-40) injection 20 mg    triamcinolone acetonide (KENALOG-40) injection 20 mg  2. Adhesive capsulitis of left shoulder  M75.02 triamcinolone acetonide (KENALOG-40) injection 20 mg    triamcinolone acetonide (KENALOG-40) injection 20 mg   Total encounter time: 20 minutes. This includes total time spent on the day of encounter outside of procedure.  We reviewed her MRI together face-to-face and reviewed her films together.  She has a large near full-thickness rotator cuff tear of the easily seen on the T2 weighted coronal images.  There is significant retraction.  Clinically, my best guess is that this was injured after her fall in November.  She has been doing physical therapy before, and I gave her some rehab to work on today as well.  We talked about limitations with management of nonoperative treatment of rotator cuff tear including some weakness in abduction, but her strength is actually quite good.  Her limitation right now is pain at nighttime.  A combined intra-articular and subacromial injection of the left shoulder is considered 1 large joint injection.  Aspiration/Injection Procedure Note Carla Lyons Mar 24, 1941 Date of procedure: 03/06/2021  Procedure: Large Joint Aspiration / Injection of Shoulder, Intraarticular, L Indications: Pain  Procedure Details Verbal consent was obtained from the patient. Risks explained and contrasted with benefits and alternatives. Patient prepped with Chloraprep and Ethyl Chloride used for anesthesia. An intraarticular shoulder injection was performed using the posterior approach. The patient tolerated the procedure well and had decreased pain post injection. No complications. Injection: 4 cc of Lidocaine 1% and 1/2 mL Kenalog 40 mg. Needle: 21 gauge, 2 inch Medication: 1/2 cc of Kenalog 40 mg (equaling Kenalog 20  mg)  Aspiration/Injection Procedure Note Carla Lyons Jun 21, 1941 Date of procedure: 03/06/2021  Procedure: Large Joint Aspiration / Injection of Shoulder, Subacromial, L Indications: Pain  Procedure Details Verbal consent was obtained from the patient. Risks, benefits, and alternatives were explained. Patient prepped with Chloraprep and Ethyl Chloride used for anesthesia. The subacromial space was injected using the posterior approach. The patient tolerated the procedure well and had decreased pain post injection. No complications. Injection: 4 cc of Lidocaine 1% and 1/2 mL of Kenalog 40 mg. Needle: 22 gauge, 1 1/2 inch Medication: 1/2 cc of Kenalog 40 mg (equaling Kenalog 20 mg)   Meds ordered this encounter  Medications  . triamcinolone acetonide (KENALOG-40) injection 20 mg  . triamcinolone acetonide (KENALOG-40) injection 20 mg   There are no discontinued medications. No orders of the defined types were placed in this encounter.   Follow-up: No follow-ups on file.  Signed,  Maud Deed. Tifini Reeder, MD   Outpatient Encounter Medications as of 03/06/2021  Medication Sig  . ALPRAZolam (XANAX) 0.25 MG tablet Take 1 tablet (0.25 mg total) by mouth at bedtime as needed for anxiety.  Marland Kitchen amLODipine (NORVASC) 5 MG tablet TAKE 1 TABLET BY MOUTH EVERY DAY  . atorvastatin (LIPITOR) 80 MG tablet TAKE 1 TABLET BY MOUTH EVERY DAY  . b complex vitamins tablet Take 1 tablet by mouth daily.  Marland Kitchen  benazepril (LOTENSIN) 10 MG tablet Take 1 tablet (10 mg total) by mouth daily.  Marland Kitchen BIOTIN 5000 PO Take 10,000 mcg by mouth daily.  . cholecalciferol (VITAMIN D) 1000 UNITS tablet Take 1,000 Units by mouth daily.   . clopidogrel (PLAVIX) 75 MG tablet Take 1 tablet (75 mg total) by mouth daily.  . Cyanocobalamin 1000 MCG SUBL Place 1 tablet under the tongue daily.  Marland Kitchen ezetimibe (ZETIA) 10 MG tablet Take 1 tablet (10 mg total) by mouth daily.  . ferrous sulfate 325 (65 FE) MG tablet Take 325 mg by mouth at  bedtime.  . furosemide (LASIX) 20 MG tablet Take 20 mg by mouth as needed for edema.  . hydrochlorothiazide (HYDRODIURIL) 12.5 MG tablet Take 12.5 mg by mouth daily.   Marland Kitchen levothyroxine (SYNTHROID) 88 MCG tablet Take 1 tablet (88 mcg total) by mouth daily before breakfast.  . Omega-3 Fatty Acids (FISH OIL) 1000 MG CAPS Take 1,000 mg by mouth in the morning and at bedtime.   Marland Kitchen omeprazole (PRILOSEC) 40 MG capsule TAKE 1 CAPSULE BY MOUTH EVERY DAY BEFORE BREAKFAST  . traMADol (ULTRAM) 50 MG tablet Take 1 tablet (50 mg total) by mouth every 8 (eight) hours as needed for moderate pain.  . valACYclovir (VALTREX) 1000 MG tablet Take 1 tablet (1,000 mg total) by mouth 3 (three) times daily.  . [EXPIRED] triamcinolone acetonide (KENALOG-40) injection 20 mg   . [EXPIRED] triamcinolone acetonide (KENALOG-40) injection 20 mg    No facility-administered encounter medications on file as of 03/06/2021.

## 2021-03-06 ENCOUNTER — Ambulatory Visit (INDEPENDENT_AMBULATORY_CARE_PROVIDER_SITE_OTHER): Payer: Medicare Other | Admitting: Family Medicine

## 2021-03-06 ENCOUNTER — Other Ambulatory Visit: Payer: Self-pay

## 2021-03-06 ENCOUNTER — Encounter: Payer: Self-pay | Admitting: Family Medicine

## 2021-03-06 VITALS — BP 128/70 | HR 60 | Temp 97.5°F | Ht 63.0 in | Wt 157.0 lb

## 2021-03-06 DIAGNOSIS — S46012A Strain of muscle(s) and tendon(s) of the rotator cuff of left shoulder, initial encounter: Secondary | ICD-10-CM

## 2021-03-06 DIAGNOSIS — M7502 Adhesive capsulitis of left shoulder: Secondary | ICD-10-CM | POA: Diagnosis not present

## 2021-03-06 MED ORDER — TRIAMCINOLONE ACETONIDE 40 MG/ML IJ SUSP
20.0000 mg | Freq: Once | INTRAMUSCULAR | Status: AC
  Administered 2021-03-06: 20 mg via INTRA_ARTICULAR

## 2021-03-20 ENCOUNTER — Other Ambulatory Visit: Payer: Self-pay | Admitting: Family Medicine

## 2021-03-20 DIAGNOSIS — E039 Hypothyroidism, unspecified: Secondary | ICD-10-CM

## 2021-03-20 NOTE — Telephone Encounter (Signed)
  LAST APPOINTMENT DATE: 03/06/2021   NEXT APPOINTMENT DATE:@8 /12/2020  MEDICATION: levothyroxine (SYNTHROID) 88 MCG tablet  [facility]  [address]   Let patient know to contact pharmacy at the end of the day to make sure medication is ready.  Please notify patient to allow 48-72 hours to process  Encourage patient to contact the pharmacy for refills or they can request refills through [facility]:   LAST REFILL:  QTY:  REFILL DATE:    OTHER COMMENTS:    Okay for refill?  Please advise

## 2021-04-11 ENCOUNTER — Other Ambulatory Visit: Payer: Self-pay | Admitting: Family Medicine

## 2021-04-11 DIAGNOSIS — I10 Essential (primary) hypertension: Secondary | ICD-10-CM

## 2021-04-11 DIAGNOSIS — N183 Chronic kidney disease, stage 3 unspecified: Secondary | ICD-10-CM

## 2021-04-18 ENCOUNTER — Other Ambulatory Visit: Payer: Self-pay | Admitting: Neurology

## 2021-05-08 ENCOUNTER — Ambulatory Visit: Payer: Medicare Other

## 2021-05-13 ENCOUNTER — Other Ambulatory Visit: Payer: Self-pay

## 2021-05-13 ENCOUNTER — Emergency Department: Payer: Medicare Other

## 2021-05-13 ENCOUNTER — Encounter: Payer: Self-pay | Admitting: Emergency Medicine

## 2021-05-13 DIAGNOSIS — E039 Hypothyroidism, unspecified: Secondary | ICD-10-CM | POA: Insufficient documentation

## 2021-05-13 DIAGNOSIS — Z79899 Other long term (current) drug therapy: Secondary | ICD-10-CM | POA: Diagnosis not present

## 2021-05-13 DIAGNOSIS — Z87891 Personal history of nicotine dependence: Secondary | ICD-10-CM | POA: Diagnosis not present

## 2021-05-13 DIAGNOSIS — Z7902 Long term (current) use of antithrombotics/antiplatelets: Secondary | ICD-10-CM | POA: Insufficient documentation

## 2021-05-13 DIAGNOSIS — M545 Low back pain, unspecified: Secondary | ICD-10-CM | POA: Insufficient documentation

## 2021-05-13 DIAGNOSIS — I129 Hypertensive chronic kidney disease with stage 1 through stage 4 chronic kidney disease, or unspecified chronic kidney disease: Secondary | ICD-10-CM | POA: Diagnosis not present

## 2021-05-13 DIAGNOSIS — N183 Chronic kidney disease, stage 3 unspecified: Secondary | ICD-10-CM | POA: Diagnosis not present

## 2021-05-13 NOTE — ED Triage Notes (Signed)
Pt is from home via [facility]. Per medic report, pt was getting something out of the fridge, twisted wrong, heard a pop in the left hip, unable to bear weight no shortening or rotation pelvis stable. Non tender to palpation. Hurts to stand. 130/70, hr 74, 97% RA, 97.9. RR 18-20. Alert and oriented.

## 2021-05-13 NOTE — ED Triage Notes (Signed)
Pt reports that she was at her sisters house trying to clean out her refrigerator while reaching back she heard a pop. It is non tender to palpation but it does hurt to stand.

## 2021-05-14 ENCOUNTER — Emergency Department
Admission: EM | Admit: 2021-05-14 | Discharge: 2021-05-14 | Disposition: A | Discharge status: Home / Self Care | Payer: Medicare Other | Attending: Emergency Medicine | Admitting: Emergency Medicine

## 2021-05-14 ENCOUNTER — Other Ambulatory Visit: Payer: Self-pay | Admitting: Family

## 2021-05-14 DIAGNOSIS — E782 Mixed hyperlipidemia: Secondary | ICD-10-CM

## 2021-05-14 DIAGNOSIS — M5442 Lumbago with sciatica, left side: Secondary | ICD-10-CM

## 2021-05-14 MED ORDER — KETOROLAC TROMETHAMINE 60 MG/2ML IM SOLN
60.0000 mg | Freq: Once | INTRAMUSCULAR | Status: AC
  Administered 2021-05-14: 60 mg via INTRAMUSCULAR
  Filled 2021-05-14: qty 2

## 2021-05-14 MED ORDER — PREDNISONE 10 MG PO TABS: 10.0000 mg | ORAL_TABLET | Freq: Every day | ORAL | 0 refills | Status: DC

## 2021-05-14 MED ORDER — KETOROLAC TROMETHAMINE 10 MG PO TABS: 10.0000 mg | ORAL_TABLET | Freq: Four times a day (QID) | ORAL | 0 refills | Status: DC | PRN

## 2021-05-14 MED ORDER — CLASSICS ROLLING WALKER MISC: 0 refills | Status: DC

## 2021-05-14 NOTE — ED Notes (Signed)
Pt A&OX4 at d/c; wheeled out of ED via wheelchair. Pts daughter will be driving her home. Pt verbalized understanding of d/c instructions, prescriptions and follow up care.

## 2021-05-14 NOTE — ED Notes (Signed)
Pt able to ambulate in her room with the assistance of a walker. She states it was still painful but the IM injection of Toradol decreased her pain.
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
## 2021-05-15 ENCOUNTER — Encounter: Payer: Medicare Other | Admitting: Family Medicine

## 2021-05-22 ENCOUNTER — Other Ambulatory Visit: Payer: Self-pay

## 2021-05-22 ENCOUNTER — Ambulatory Visit (INDEPENDENT_AMBULATORY_CARE_PROVIDER_SITE_OTHER): Payer: Medicare Other | Admitting: Family Medicine

## 2021-05-22 ENCOUNTER — Encounter: Payer: Self-pay | Admitting: Family Medicine

## 2021-05-22 VITALS — BP 130/68 | HR 50 | Temp 97.7°F | Ht 62.0 in | Wt 145.5 lb

## 2021-05-22 DIAGNOSIS — M810 Age-related osteoporosis without current pathological fracture: Secondary | ICD-10-CM | POA: Diagnosis not present

## 2021-05-22 DIAGNOSIS — E039 Hypothyroidism, unspecified: Secondary | ICD-10-CM | POA: Diagnosis not present

## 2021-05-22 DIAGNOSIS — E785 Hyperlipidemia, unspecified: Secondary | ICD-10-CM | POA: Diagnosis not present

## 2021-05-22 DIAGNOSIS — N1832 Chronic kidney disease, stage 3b: Secondary | ICD-10-CM

## 2021-05-22 DIAGNOSIS — Z0000 Encounter for general adult medical examination without abnormal findings: Secondary | ICD-10-CM

## 2021-05-22 DIAGNOSIS — E2839 Other primary ovarian failure: Secondary | ICD-10-CM

## 2021-05-22 DIAGNOSIS — D539 Nutritional anemia, unspecified: Secondary | ICD-10-CM | POA: Diagnosis not present

## 2021-05-22 DIAGNOSIS — Z1159 Encounter for screening for other viral diseases: Secondary | ICD-10-CM

## 2021-05-22 LAB — LIPID PANEL
Cholesterol: 124 mg/dL (ref 0–200)
HDL: 53.4 mg/dL (ref >39.00)
LDL Cholesterol: 58 mg/dL (ref 0–99)
NonHDL: 71.02
Total CHOL/HDL Ratio: 2
Triglycerides: 65 mg/dL (ref 0.0–149.0)
VLDL: 13 mg/dL (ref 0.0–40.0)

## 2021-05-22 LAB — COMPREHENSIVE METABOLIC PANEL
ALT: 49 U/L — ABNORMAL HIGH (ref 0–35)
AST: 30 U/L (ref 0–37)
Albumin: 3.7 g/dL (ref 3.5–5.2)
Alkaline Phosphatase: 61 U/L (ref 39–117)
BUN: 32 mg/dL — ABNORMAL HIGH (ref 6–23)
CO2: 28 mEq/L (ref 19–32)
Calcium: 9.7 mg/dL (ref 8.4–10.5)
Chloride: 105 mEq/L (ref 96–112)
Creatinine, Ser: 1.42 mg/dL — ABNORMAL HIGH (ref 0.40–1.20)
GFR: 35.26 mL/min — ABNORMAL LOW (ref >60.00)
Glucose, Bld: 79 mg/dL (ref 70–99)
Potassium: 4.7 mEq/L (ref 3.5–5.1)
Sodium: 141 mEq/L (ref 135–145)
Total Bilirubin: 1 mg/dL (ref 0.2–1.2)
Total Protein: 5.7 g/dL — ABNORMAL LOW (ref 6.0–8.3)

## 2021-05-22 LAB — CBC
HCT: 34.1 % — ABNORMAL LOW (ref 36.0–46.0)
Hemoglobin: 11.4 g/dL — ABNORMAL LOW (ref 12.0–15.0)
MCHC: 33.3 g/dL (ref 30.0–36.0)
MCV: 98 fl (ref 78.0–100.0)
Platelets: 257 10*3/uL (ref 150.0–400.0)
RBC: 3.48 Mil/uL — ABNORMAL LOW (ref 3.87–5.11)
RDW: 13.4 % (ref 11.5–15.5)
WBC: 10.2 10*3/uL (ref 4.0–10.5)

## 2021-05-22 LAB — TSH: TSH: 3.58 u[IU]/mL (ref 0.35–5.50)

## 2021-05-22 NOTE — Patient Instructions (Signed)
Preventive Care 79 Years and Older, Female Preventive care refers to lifestyle choices and visits with your health care provider that can promote health and wellness. This includes: A yearly physical exam. This is also called an annual wellness visit. Regular dental and eye exams. Immunizations. Screening for certain conditions. Healthy lifestyle choices, such as: Eating a healthy diet. Getting regular exercise. Not using drugs or products that contain nicotine and tobacco. Limiting alcohol use. What can I expect for my preventive care visit? Physical exam Your health care provider will check your: Height and weight. These may be used to calculate your BMI (body mass index). BMI is a measurement that tells if you are at a healthy weight. Heart rate and blood pressure. Body temperature. Skin for abnormal spots. Counseling Your health care provider may ask you questions about your: Past medical problems. Family's medical history. Alcohol, tobacco, and drug use. Emotional well-being. Home life and relationship well-being. Sexual activity. Diet, exercise, and sleep habits. History of falls. Memory and ability to understand (cognition). Work and work Statistician. Pregnancy and menstrual history. Access to firearms. What immunizations do I need?  Vaccines are usually given at various ages, according to a schedule. Your health care provider will recommend vaccines for you based on your age, medicalhistory, and lifestyle or other factors, such as travel or where you work. What tests do I need? Blood tests Lipid and cholesterol levels. These may be checked every 5 years, or more often depending on your overall health. Hepatitis C test. Hepatitis B test. Screening Lung cancer screening. You may have this screening every year starting at age 79 if you have a 30-pack-year history of smoking and currently smoke or have quit within the past 15 years. Colorectal cancer screening. All  adults should have this screening starting at age 79 and continuing until age 65. Your health care provider may recommend screening at age 61 if you are at increased risk. You will have tests every 1-10 years, depending on your results and the type of screening test. Diabetes screening. This is done by checking your blood sugar (glucose) after you have not eaten for a while (fasting). You may have this done every 1-3 years. Mammogram. This may be done every 1-2 years. Talk with your health care provider about how often you should have regular mammograms. Abdominal aortic aneurysm (AAA) screening. You may need this if you are a current or former smoker. BRCA-related cancer screening. This may be done if you have a family history of breast, ovarian, tubal, or peritoneal cancers. Other tests STD (sexually transmitted disease) testing, if you are at risk. Bone density scan. This is done to screen for osteoporosis. You may have this done starting at age 79. Talk with your health care provider about your test results, treatment options,and if necessary, the need for more tests. Follow these instructions at home: Eating and drinking  Eat a diet that includes fresh fruits and vegetables, whole grains, lean protein, and low-fat dairy products. Limit your intake of foods with high amounts of sugar, saturated fats, and salt. Take vitamin and mineral supplements as recommended by your health care provider. Do not drink alcohol if your health care provider tells you not to drink. If you drink alcohol: Limit how much you have to 0-1 drink a day. Be aware of how much alcohol is in your drink. In the [place] one drink equals one 12 oz bottle of beer (355 mL), one 5 oz glass of wine (148 mL), or one 1  oz glass of hard liquor (44 mL).  Lifestyle Take daily care of your teeth and gums. Brush your teeth every morning and night with fluoride toothpaste. Floss one time each day. Stay active. Exercise for at  least 30 minutes 5 or more days each week. Do not use any products that contain nicotine or tobacco, such as cigarettes, e-cigarettes, and chewing tobacco. If you need help quitting, ask your health care provider. Do not use drugs. If you are sexually active, practice safe sex. Use a condom or other form of protection in order to prevent STIs (sexually transmitted infections). Talk with your health care provider about taking a low-dose aspirin or statin. Find healthy ways to cope with stress, such as: Meditation, yoga, or listening to music. Journaling. Talking to a trusted person. Spending time with friends and family. Safety Always wear your seat belt while driving or riding in a vehicle. Do not drive: If you have been drinking alcohol. Do not ride with someone who has been drinking. When you are tired or distracted. While texting. Wear a helmet and other protective equipment during sports activities. If you have firearms in your house, make sure you follow all gun safety procedures. What's next? Visit your health care provider once a year for an annual wellness visit. Ask your health care provider how often you should have your eyes and teeth checked. Stay up to date on all vaccines. This information is not intended to replace advice given to you by your health care provider. Make sure you discuss any questions you have with your healthcare provider. Document Revised: 09/12/2020 Document Reviewed: 09/16/2018 Elsevier Patient Education  2022 [employer].

## 2021-05-22 NOTE — Progress Notes (Signed)
Subjective:   Carla Lyons is a 80 y.o. female who presents for Medicare Annual (Subsequent) preventive examination.  Review of Systems    Review of Systems  Constitutional:  Negative for chills and fever.  HENT:  Negative for congestion and sore throat.   Eyes:  Negative for blurred vision and double vision.  Respiratory:  Negative for shortness of breath.   Cardiovascular:  Negative for chest pain.  Gastrointestinal:  Negative for heartburn, nausea and vomiting.  Genitourinary: Negative.   Musculoskeletal: Negative.  Negative for myalgias.  Skin:  Negative for rash.  Neurological:  Negative for dizziness and headaches.  Endo/Heme/Allergies:  Does not bruise/bleed easily.  Psychiatric/Behavioral:  Negative for depression. The patient is not nervous/anxious.    Cardiac Risk Factors include: advanced age (>29mn, >>20women);dyslipidemia     Objective:    Today's Vitals   05/22/21 0836 05/22/21 0853  BP: 130/68   Pulse: (!) 50   Temp: 97.7 F (36.5 C)   TempSrc: Temporal   SpO2: 98%   Weight: 145 lb 8 oz (66 kg)   Height: '5\' 2"'$  (1.575 m)   PainSc:  3    Body mass index is 26.61 kg/m.  Advanced Directives 05/22/2021 05/13/2021 05/22/2020 03/03/2020 03/02/2020 04/01/2019 03/05/2018  Does Patient Have a Medical Advance Directive? No No Yes No No Yes Yes  Type of Advance Directive - -Public librarianLiving will - - [place] will [place]  Does patient want to make changes to medical advance directive? Yes (MAU/Ambulatory/Procedural Areas - Information given) - No - Patient declined - - No - Patient declined -  Copy of [place] Chart? - - No - copy requested - - No - copy requested Yes  Would patient like information on creating a medical advance directive? Yes (MAU/Ambulatory/Procedural Areas - Information given) - - No - Guardian declined No - Guardian declined - -    Current Medications  (verified) Outpatient Encounter Medications as of 05/22/2021  Medication Sig   ALPRAZolam (XANAX) 0.25 MG tablet Take 1 tablet (0.25 mg total) by mouth at bedtime as needed for anxiety.   amLODipine (NORVASC) 5 MG tablet TAKE 1 TABLET BY MOUTH EVERY DAY   atorvastatin (LIPITOR) 80 MG tablet TAKE 1 TABLET BY MOUTH EVERY DAY   b complex vitamins tablet Take 1 tablet by mouth daily.   benazepril (LOTENSIN) 10 MG tablet TAKE 1 TABLET BY MOUTH EVERY DAY   BIOTIN 5000 PO Take 10,000 mcg by mouth daily.   cholecalciferol (VITAMIN D) 1000 UNITS tablet Take 1,000 Units by mouth daily.    clopidogrel (PLAVIX) 75 MG tablet TAKE 1 TABLET BY MOUTH EVERY DAY   Cyanocobalamin 1000 MCG SUBL Place 1 tablet under the tongue daily.   ezetimibe (ZETIA) 10 MG tablet Take 1 tablet (10 mg total) by mouth daily.   ferrous sulfate 325 (65 FE) MG tablet Take 325 mg by mouth at bedtime.   furosemide (LASIX) 20 MG tablet Take 20 mg by mouth as needed for edema.   hydrochlorothiazide (HYDRODIURIL) 12.5 MG tablet Take 12.5 mg by mouth daily.    levothyroxine (SYNTHROID) 88 MCG tablet TAKE 1 TABLET (88 MCG TOTAL) BY MOUTH DAILY BEFORE BREAKFAST.   Omega-3 Fatty Acids (FISH OIL) 1000 MG CAPS Take 1,000 mg by mouth in the morning and at bedtime.    omeprazole (PRILOSEC) 40 MG capsule TAKE 1 CAPSULE BY MOUTH EVERY DAY BEFORE BREAKFAST   predniSONE (  DELTASONE) 10 MG tablet Take 1 tablet (10 mg total) by mouth daily. Day 1-3: take 4 tablets PO daily Day 4-6: take 3 tablets PO daily Day 7-9: take 2 tablets PO daily Day 10-12: take 1 tablet PO daily   traMADol (ULTRAM) 50 MG tablet Take 1 tablet (50 mg total) by mouth every 8 (eight) hours as needed for moderate pain.   valACYclovir (VALTREX) 1000 MG tablet Take 1 tablet (1,000 mg total) by mouth 3 (three) times daily.   [DISCONTINUED] ketorolac (TORADOL) 10 MG tablet Take 1 tablet (10 mg total) by mouth every 6 (six) hours as needed.   [DISCONTINUED] Misc. Devices (CLASSICS  ROLLING WALKER) MISC Please provide 1 rolling walker.   No facility-administered encounter medications on file as of 05/22/2021.    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   Colon polyps 06-15-07   Diverticulosis    hx of   Hyperlipidemia    Hypertension    Iron deficiency anemia due to chronic blood loss 12/06/2019   Large hiatal hernia 12/06/2019   Osteoporosis    Personal history of colonic adenomas 07/19/2007   Renal insufficiency    Thyroid disease    TIA (transient ischemic attack)    Trigeminal neuralgia 01/04/2015   Past Surgical History:  Procedure Laterality Date   APPENDECTOMY     CATARACT EXTRACTION W/ INTRAOCULAR LENS IMPLANT Right 01/08/2018   CATARACT EXTRACTION W/ INTRAOCULAR LENS IMPLANT Left 02/05/2018   CESAREAN SECTION     x 2   COLONOSCOPY  11/17/2018   ESOPHAGOGASTRODUODENOSCOPY ENDOSCOPY     PARTIAL HYSTERECTOMY  age 12   VAGINAL HYSTERECTOMY  age 59   partial   Family History  Problem Relation Age of Onset   Coronary artery disease Mother    Other Mother        bypass surgery   Dementia Mother    Osteoarthritis Father    Heart disease Father    Heart attack Maternal Grandfather    Lung disease Brother    Hypertension Brother    Lung disease Sister    Lung disease Sister    Colon cancer Neg Hx    Esophageal cancer Neg Hx    Rectal cancer Neg Hx    Stomach cancer Neg Hx    Colon polyps Neg Hx    Social History   Socioeconomic History   Marital status: Widowed    Spouse name: Not on file   Number of children: 2   Years of education: Not on file   Highest education level: Not on file  Occupational History   Occupation: Retired     Fish farm manager: RETIRED  Tobacco Use   Smoking status: Former     Packs/day: 0.25    Years: 15.00    Pack years: 3.75    Types: Cigarettes    Quit date: 10/07/2001    Years since quitting: 19.6   Smokeless tobacco: Never  Vaping Use   Vaping Use: Never used  Substance and Sexual Activity   Alcohol use: Not Currently   Drug use: No   Sexual activity: Not Currently  Other Topics Concern   Not on file  Social History Narrative   Does have a living will.   Daughter is HPOA- does  not want any extra ordinary measures.  Would want CPR.      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      Family: Daughter - Avel Peace and [place]wick - 3 grandsons and 1 granddaughter - 2 great-grandchildren -- close      Enjoys: sewing, reading, yardwork, being outside      Exercise: walking - treadmill          Social Determinants of Radio broadcast assistant Strain: Not on file  Food Insecurity: Not on file  Transportation Needs: Not on file  Physical Activity: Not on file  Stress: Not on file  Social Connections: Not on file    Tobacco Counseling Counseling given: Not Answered   Clinical Intake:  Pre-visit preparation completed: No  Pain : 0-10 Pain Score: 3  Pain Type: Acute pain, Chronic pain Pain Location: Back Pain Orientation: Lower Pain Onset: 1 to 4 weeks ago Pain Frequency: Intermittent     Nutritional Status: BMI 25 -29 Overweight Nutritional Risks: None Diabetes: No  How often do you need to have someone help you when you read instructions, pamphlets, or other written materials from your doctor or pharmacy?: 2 - Rarely What is the last grade level you completed in school?: high school  Diabetic?no  Interpreter Needed?: No      Activities of Daily Living In your present state of health, do you have any difficulty performing the following activities: 05/22/2021 03/06/2021  Hearing? N N  Vision? N N  Difficulty concentrating or making decisions? N N  Walking or climbing stairs? N N   Dressing or bathing? N N  Doing errands, shopping? N N  Preparing Food and eating ? N -  Using the Toilet? N -  In the past six months, have you accidently leaked urine? N -  Do you have problems with loss of bowel control? N -  Managing your Medications? N -  Managing your Finances? N -  Housekeeping or managing your Housekeeping? N -  Some recent data might be hidden    Patient Care Team: Lesleigh Noe, MD as PCP - General (Family Medicine) End, Harrell Gave, MD as PCP - Cardiology (Cardiology)  Indicate any recent Medical Services you may have received from other than [facility] providers in the past year (date may be approximate).     Assessment:   This is a routine wellness examination for [facility].  Hearing/Vision screen Hearing Screening   '250Hz'$  '500Hz'$  '1000Hz'$  '2000Hz'$  '4000Hz'$   Right ear '20 20 20 20 20  '$ Left ear 20 40 40 20 40  Vision Screening - Comments:: Last eye exam May 2022 @ [facility], [place]  Dietary issues and exercise activities discussed: Current Exercise Habits: Home exercise routine, Type of exercise: walking, Time (Minutes): 25, Frequency (Times/Week): 5, Weekly Exercise (Minutes/Week): 125, Intensity: Mild, Exercise limited by: orthopedic condition(s)   Goals Addressed             This Visit's Progress    DIET - EAT MORE FRUITS AND VEGETABLES        Depression Screen PHQ 2/9 Scores 05/22/2021 01/08/2021 09/19/2020 04/18/2020 03/05/2018 03/05/2017 02/26/2017  PHQ - 2 Score 2 0 0 0 1 0 0  PHQ- 9 Score - - - 1 4 - -    Fall Risk Fall Risk  05/22/2021 09/19/2020 04/25/2020 04/18/2020 12/14/2019  Falls in the past year? 0 0 0 0 0  Number falls in past yr: 0 0 - 0 -  Injury with Fall? 1 0 - 0 -  Risk for fall due to : Impaired balance/gait;Impaired mobility;Orthopedic patient - - - -  Follow up Falls evaluation completed Falls evaluation completed - - -    FALL RISK PREVENTION PERTAINING TO THE HOME:  Any stairs in or around the home? No  If so,  are there any without handrails?  N/a Home free of loose throw rugs in walkways, pet beds, electrical cords, etc? No  Adequate lighting in your home to reduce risk of falls? Yes   ASSISTIVE DEVICES UTILIZED TO PREVENT FALLS:  Life alert? No  Use of a cane, walker or w/c? Yes  Grab bars in the bathroom? Yes  Shower chair or bench in shower? Yes  Elevated toilet seat or a handicapped toilet? No     Cognitive Function: MMSE - Mini Mental State Exam 03/05/2018 02/26/2017  Orientation to time 5 5  Orientation to Place 5 5  Registration 3 3  Attention/ Calculation 0 0  Recall 3 3  Language- name 2 objects 0 0  Language- repeat 1 1  Language- follow 3 step command 3 3  Language- read & follow direction 0 0  Write a sentence 0 0  Copy design 0 0  Total score 20 20       Mini-Cog - 05/22/21 0904     Normal clock drawing test? yes    How many words correct? 3                Immunizations Immunization History  Administered Date(s) Administered   Influenza Split 08/05/2011   Influenza Whole 07/17/2004, 07/10/2009, 07/02/2010   Influenza, High Dose Seasonal PF 07/27/2018, 05/20/2019   Influenza,inj,Quad PF,6+ Mos 06/16/2013   Influenza-Unspecified 06/19/2014, 05/25/2015, 06/11/2017   PFIZER(Purple Top)SARS-COV-2 Vaccination 10/26/2019, 11/16/2019, 10/19/2020   Pneumococcal Conjugate-13 03/09/2014   Pneumococcal Polysaccharide-23 07/17/2004, 02/26/2017   Td 06/20/2006   Tdap 09/06/2020   Zoster, Live 03/09/2014    TDAP status: Up to date  Flu Vaccine status: Due, Education has been provided regarding the importance of this vaccine. Advised may receive this vaccine at local pharmacy or Health Dept. Aware to provide a copy of the vaccination record if obtained from local pharmacy or Health Dept. Verbalized acceptance and understanding.  Pneumococcal vaccine status: Up to date  Covid-19 vaccine status: Completed vaccines  Qualifies for Shingles Vaccine? Yes   Zostavax  completed Yes   Shingrix Completed?: No.    Education has been provided regarding the importance of this vaccine. Patient has been advised to call insurance company to determine out of pocket expense if they have not yet received this vaccine. Advised may also receive vaccine at local pharmacy or Health Dept. Verbalized acceptance and understanding.  Screening Tests Health Maintenance  Topic Date Due   Hepatitis C Screening  Never done   Zoster Vaccines- Shingrix (1 of 2) Never done   COVID-19 Vaccine (4 - Booster for Pfizer series) 01/17/2021   MAMMOGRAM  03/20/2021   INFLUENZA VACCINE  05/06/2021   COLONOSCOPY (Pts 45-89yr Insurance coverage will need to be confirmed)  11/18/2023   TETANUS/TDAP  09/06/2030   DEXA SCAN  Completed   PNA vac Low Risk Adult  Completed   HPV VACCINES  Aged Out    Health Maintenance  Health Maintenance Due  Topic Date Due   Hepatitis C Screening  Never done   Zoster Vaccines- Shingrix (1 of 2) Never done  COVID-19 Vaccine (4 - Booster for Pfizer series) 01/17/2021   MAMMOGRAM  03/20/2021   INFLUENZA VACCINE  05/06/2021    Colorectal cancer screening: Type of screening: Colonoscopy. Completed 2020. Repeat every not indicated years  Mammogram status: Ordered pt will schedule. Pt provided with contact info and advised to call to schedule appt.   Bone Density status: Ordered for [facility] for update. Pt provided with contact info and advised to call to schedule appt.  Lung Cancer Screening: (Low Dose CT Chest recommended if Age 51-80 years, 30 pack-year currently smoking OR have quit w/in 15years.) does not qualify.   Lung Cancer Screening Referral: n/a  Additional Screening:  Hepatitis C Screening: does qualify; Completed today  Vision Screening: Recommended annual ophthalmology exams for early detection of glaucoma and other disorders of the eye. Is the patient up to date with their annual eye exam?  Yes    Dental Screening: Recommended annual  dental exams for proper oral hygiene  Community Resource Referral / Chronic Care Management: CRR required this visit?  No   CCM required this visit?  No      Plan:     I have personally reviewed and noted the following in the patient's chart:   Medical and social history Use of alcohol, tobacco or illicit drugs  Current medications and supplements including opioid prescriptions.  Functional ability and status Nutritional status Physical activity Advanced directives List of other physicians Hospitalizations, surgeries, and ER visits in previous 12 months Vitals Screenings to include cognitive, depression, and falls Referrals and appointments  In addition, I have reviewed and discussed with patient certain preventive protocols, quality metrics, and best practice recommendations. A written personalized care plan for preventive services as well as general preventive health recommendations were provided to patient.     Lesleigh Noe, MD   05/22/2021

## 2021-05-23 LAB — HEPATITIS C ANTIBODY
Hepatitis C Ab: NONREACTIVE
SIGNAL TO CUT-OFF: 0 (ref <1.00)

## 2021-05-29 IMAGING — CR DG CHEST 2V
2 series · 2 of 2 positions shown · non-contrast
Comparison: Radiograph 05/27/2011.  CT 04/18/2020

CLINICAL DATA: Mid chest pain.

EXAM:
CHEST - 2 VIEW

[chest pa]
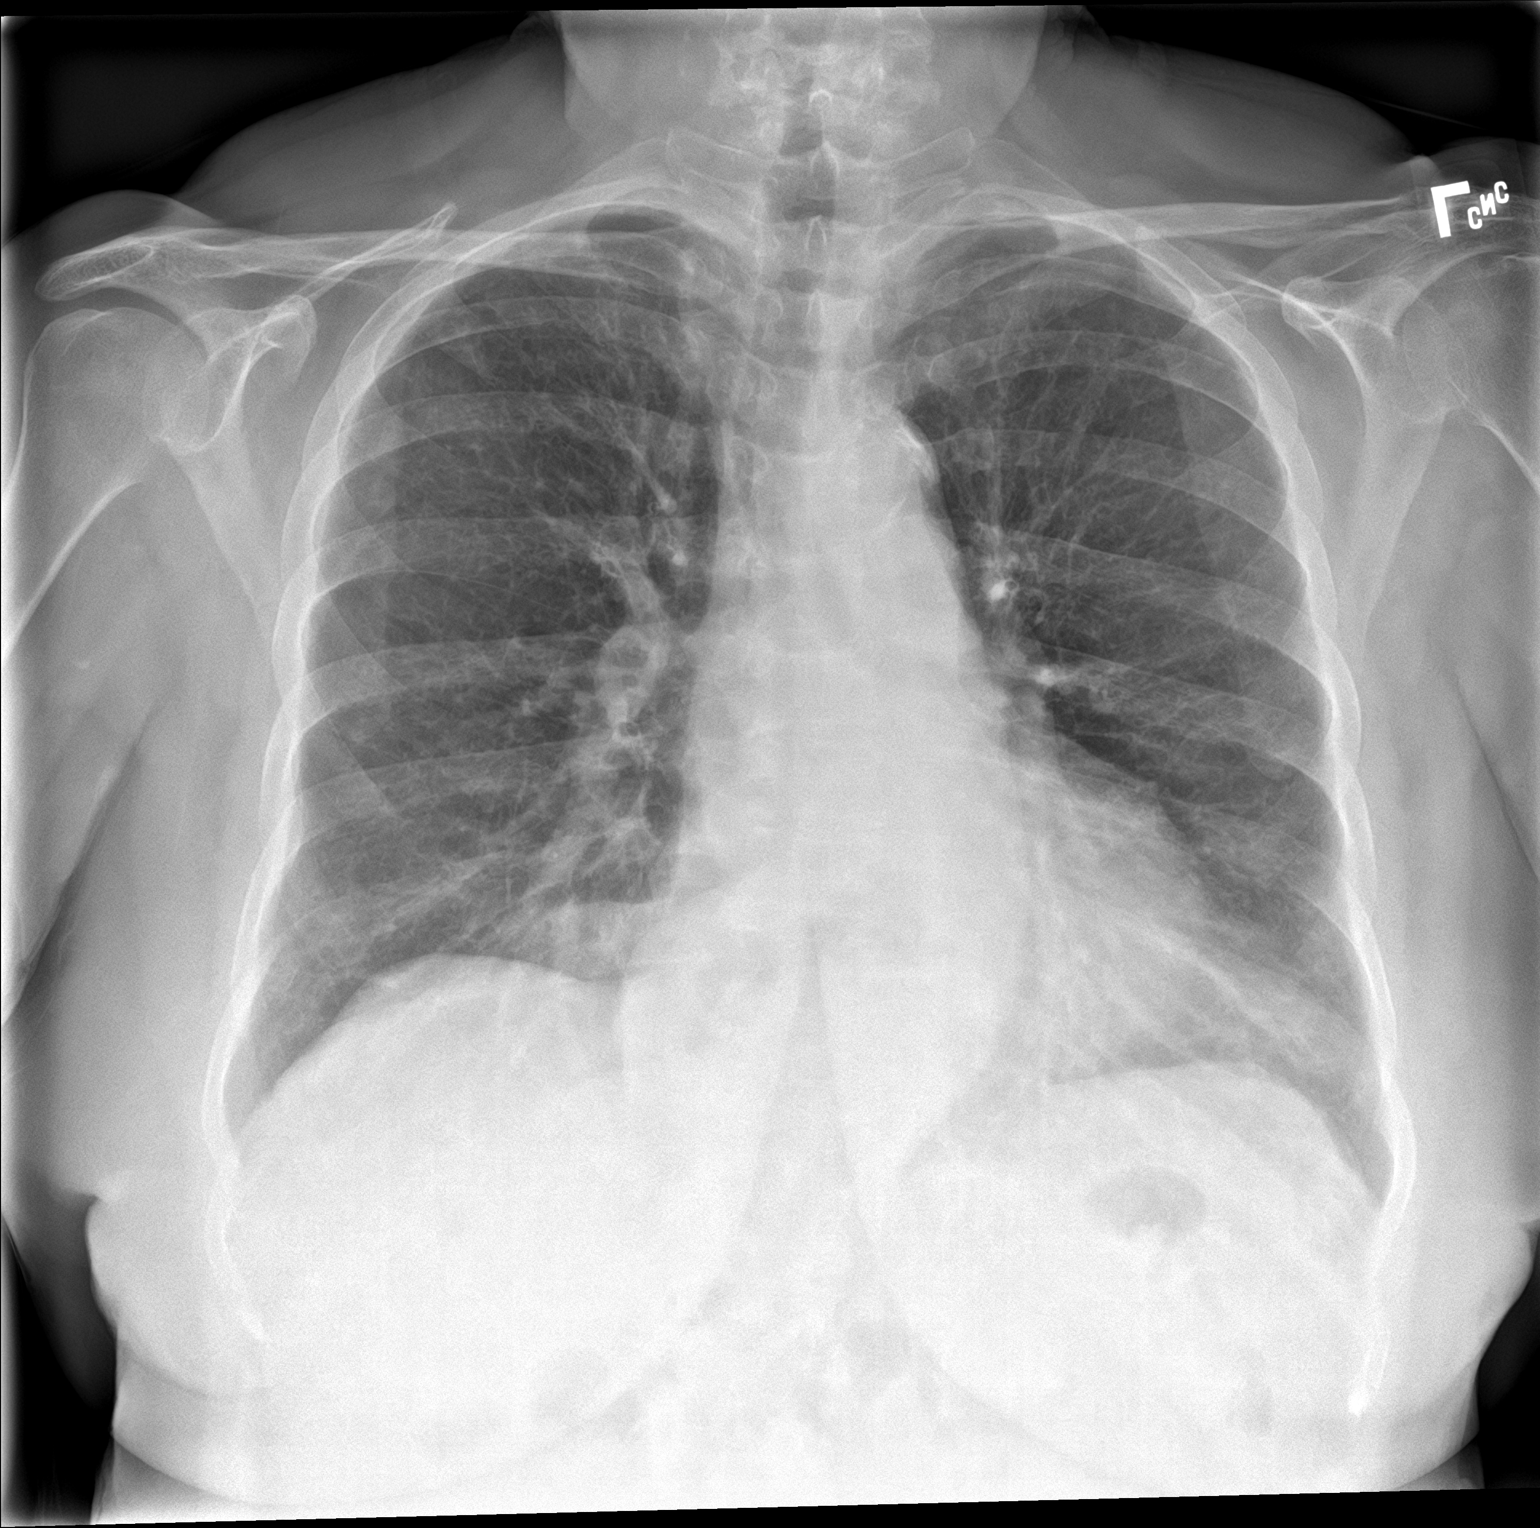

[chest lat]
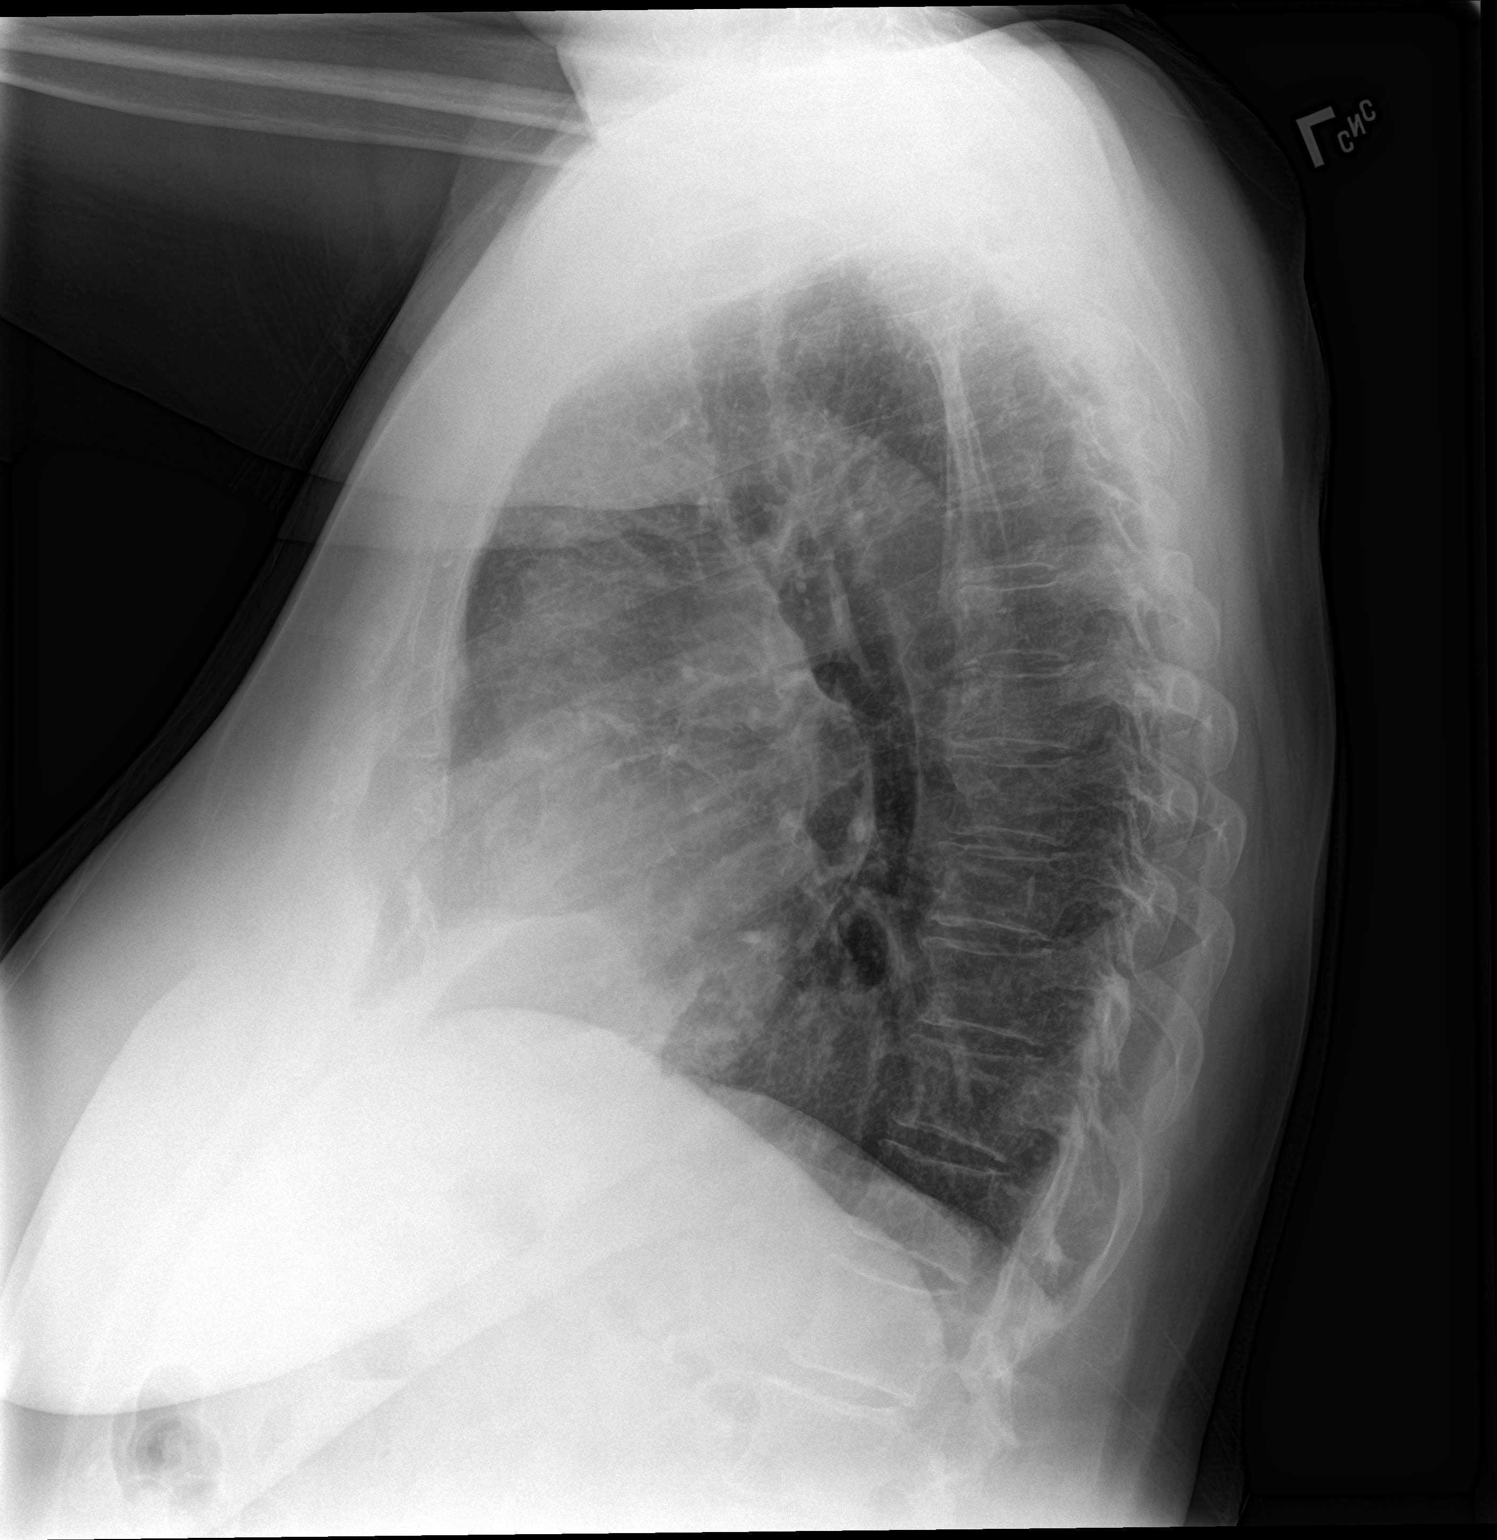

[2 of 2 positions shown; findings below may reference images not displayed]

FINDINGS: The cardiomediastinal contours are unchanged. Atherosclerosis of the
thoracic aorta. Moderate-sized retrocardiac hiatal hernia. Chronic
peribronchial thickening. Pulmonary vasculature is normal. No
consolidation, pleural effusion, or pneumothorax. No acute osseous
abnormalities are seen.
IMPRESSION: 1. No acute chest findings.  Chronic bronchial thickening.
2. Moderate-sized hiatal hernia.

## 2021-06-14 ENCOUNTER — Other Ambulatory Visit: Payer: Self-pay | Admitting: Family

## 2021-06-14 NOTE — Telephone Encounter (Signed)
Rx request sent to pharmacy.  

## 2021-07-22 ENCOUNTER — Ambulatory Visit: Payer: Medicare Other | Admitting: Family Medicine

## 2021-10-03 ENCOUNTER — Other Ambulatory Visit: Payer: Self-pay | Admitting: Family Medicine

## 2021-10-03 DIAGNOSIS — N183 Chronic kidney disease, stage 3 unspecified: Secondary | ICD-10-CM

## 2021-10-03 DIAGNOSIS — I10 Essential (primary) hypertension: Secondary | ICD-10-CM

## 2021-11-21 ENCOUNTER — Telehealth: Payer: Self-pay | Admitting: Family Medicine

## 2021-11-21 NOTE — Telephone Encounter (Signed)
Jamie with [facility] from Dr Juleen China office called stating that pt can take Disphosphonate.

## 2021-11-22 ENCOUNTER — Other Ambulatory Visit: Payer: Self-pay | Admitting: Surgery

## 2021-11-22 NOTE — Telephone Encounter (Signed)
Please call patient to see if she has Transferred Care to [facility] clinic and update chart.   If she has transferred please ask patient to update her nephrologist of her new PCP so they can call the right office.

## 2021-11-27 ENCOUNTER — Other Ambulatory Visit: Payer: Self-pay | Admitting: Family

## 2021-11-27 DIAGNOSIS — E782 Mixed hyperlipidemia: Secondary | ICD-10-CM

## 2021-11-28 NOTE — Telephone Encounter (Signed)
Rx(s) sent to pharmacy electronically.  

## 2021-12-09 ENCOUNTER — Other Ambulatory Visit: Payer: Self-pay | Admitting: Internal Medicine

## 2021-12-09 NOTE — Telephone Encounter (Signed)
Please schedule overdue 6 month F/U. Thank you! ?

## 2021-12-09 NOTE — Telephone Encounter (Signed)
Wants to wait having surgery will call at a later date  ?

## 2021-12-11 ENCOUNTER — Other Ambulatory Visit: Payer: Self-pay | Admitting: Internal Medicine

## 2021-12-11 ENCOUNTER — Other Ambulatory Visit: Payer: Self-pay | Admitting: Family Medicine

## 2021-12-11 DIAGNOSIS — E039 Hypothyroidism, unspecified: Secondary | ICD-10-CM

## 2021-12-11 DIAGNOSIS — E782 Mixed hyperlipidemia: Secondary | ICD-10-CM

## 2021-12-12 ENCOUNTER — Other Ambulatory Visit: Payer: Self-pay

## 2021-12-12 ENCOUNTER — Other Ambulatory Visit
Admission: RE | Admit: 2021-12-12 | Discharge: 2021-12-12 | Disposition: A | Discharge status: Home / Self Care | Payer: Medicare Other | Source: Ambulatory Visit | Attending: Surgery | Admitting: Surgery

## 2021-12-12 ENCOUNTER — Inpatient Hospital Stay: Admission: RE | Admit: 2021-12-12 | Payer: Medicare Other | Source: Ambulatory Visit

## 2021-12-12 DIAGNOSIS — N1831 Chronic kidney disease, stage 3a: Secondary | ICD-10-CM

## 2021-12-12 HISTORY — DX: Cardiac arrhythmia, unspecified: I49.9

## 2021-12-12 HISTORY — DX: Hypothyroidism, unspecified: E03.9

## 2021-12-12 HISTORY — DX: Unspecified osteoarthritis, unspecified site: M19.90

## 2021-12-12 HISTORY — DX: Anxiety disorder, unspecified: F41.9

## 2021-12-12 HISTORY — DX: Cerebral infarction, unspecified: I63.9

## 2021-12-12 HISTORY — DX: Gastro-esophageal reflux disease without esophagitis: K21.9

## 2021-12-12 NOTE — Patient Instructions (Addendum)
Your procedure is scheduled on: 12/19/21 - Thursday ?Report to the Registration Desk on the 1st floor of the [facility]. ?To find out your arrival time, please call [phone] between 1PM - 3PM on: 12/18/21 - Wednesday ? ?REMEMBER: ?Instructions that are not followed completely may result in serious medical risk, up to and including death; or upon the discretion of your surgeon and anesthesiologist your surgery may need to be rescheduled. ? ?Do not eat food after midnight the night before surgery.  ?No gum chewing, lozengers or hard candies. ? ?You may however, drink CLEAR liquids up to 2 hours before you are scheduled to arrive for your surgery. Do not drink anything within 2 hours of your scheduled arrival time. ? ?Clear liquids include: ?- water  ?- apple juice without pulp ?- gatorade (not RED colors) ?- black coffee or tea (Do NOT add milk or creamers to the coffee or tea) ?Do NOT drink anything that is not on this list. ? ?In addition, your doctor has ordered for you to drink the provided  ?Ensure Pre-Surgery Clear Carbohydrate Drink  ?Drinking this carbohydrate drink up to two hours before surgery helps to reduce insulin resistance and improve patient outcomes. Please complete drinking 2 hours prior to scheduled arrival time. ? ?TAKE THESE MEDICATIONS THE MORNING OF SURGERY WITH A SIP OF WATER: ? ?- amLODipine (NORVASC) 5 MG tablet ?- omeprazole (PRILOSEC) 40 MG capsule, (take one the night before and one on the morning of surgery - helps to prevent nausea after surgery.) ?- levothyroxine (SYNTHROID) 88 MCG tablet ? ?Follow recommendations from Cardiologist, Pulmonologist or PCP regarding stopping Aspirin, Coumadin, Plavix, Eliquis, Pradaxa, or Pletal. Stop Plavix beginning 12/14/21, may resume per MD order. ? ?One week prior to surgery: ?Stop Anti-inflammatories (NSAIDS) such as Advil, Aleve, Ibuprofen, Motrin, Naproxen, Naprosyn and Aspirin based products such as Excedrin, Goodys Powder, BC  Powder. ? ?Stop ANY OVER THE COUNTER supplements until after surgery. ? ?You may take Tylenol if needed for pain up until the day of surgery. ? ?No Alcohol for 24 hours before or after surgery. ? ?No Smoking including e-cigarettes for 24 hours prior to surgery.  ?No chewable tobacco products for at least 6 hours prior to surgery.  ?No nicotine patches on the day of surgery. ? ?Do not use any "recreational" drugs for at least a week prior to your surgery.  ?Please be advised that the combination of cocaine and anesthesia may have negative outcomes, up to and including death. ?If you test positive for cocaine, your surgery will be cancelled. ? ?On the morning of surgery brush your teeth with toothpaste and water, you may rinse your mouth with mouthwash if you wish. ?Do not swallow any toothpaste or mouthwash. ? ?Use CHG Soap or wipes as directed on instruction sheet. ? ?Do not wear jewelry, make-up, hairpins, clips or nail polish. ? ?Do not wear lotions, powders, or perfumes.  ? ?Do not shave body from the neck down 48 hours prior to surgery just in case you cut yourself which could leave a site for infection.  ?Also, freshly shaved skin may become irritated if using the CHG soap. ? ?Contact lenses, hearing aids and dentures may not be worn into surgery. ? ?Do not bring valuables to the hospital. [facility] is not responsible for any missing/lost belongings or valuables.  ? ?Notify your doctor if there is any change in your medical condition (cold, fever, infection). ? ?Wear comfortable clothing (specific to your surgery type) to the hospital. ? ?  After surgery, you can help prevent lung complications by doing breathing exercises.  ?Take deep breaths and cough every 1-2 hours. Your doctor may order a device called an Incentive Spirometer to help you take deep breaths. ?When coughing or sneezing, hold a pillow firmly against your incision with both hands. This is called ?splinting.? Doing this helps protect your  incision. It also decreases belly discomfort. ? ?If you are being admitted to the hospital overnight, leave your suitcase in the car. ?After surgery it may be brought to your room. ? ?If you are being discharged the day of surgery, you will not be allowed to drive home. ?You will need a responsible adult (18 years or older) to drive you home and stay with you that night.  ? ?If you are taking public transportation, you will need to have a responsible adult (18 years or older) with you. ?Please confirm with your physician that it is acceptable to use public transportation.  ? ?Please call the [place] Dept. at [phone] if you have any questions about these instructions. ? ?Surgery Visitation Policy: ? ?Patients undergoing a surgery or procedure may have one family member or support person with them as long as that person is not COVID-19 positive or experiencing its symptoms.  ?That person may remain in the waiting area during the procedure and may rotate out with other people. ? ?Inpatient Visitation:   ? ?Visiting hours are 7 a.m. to 8 p.m. ?Up to two visitors ages 16+ are allowed at one time in a patient room. The visitors may rotate out with other people during the day. Visitors must check out when they leave, or other visitors will not be allowed. One designated support person may remain overnight. ?The visitor must pass COVID-19 screenings, use hand sanitizer when entering and exiting the patient?s room and wear a mask at all times, including in the patient?s room. ?Patients must also wear a mask when staff or their visitor are in the room. ?Masking is required regardless of vaccination status.  ?

## 2021-12-16 NOTE — Telephone Encounter (Signed)
Please advise if OK to refill or defer to PCP. Last seen 10/17/2020 with 6 month F/U advised. See below. Thank you! ?

## 2021-12-17 NOTE — Telephone Encounter (Signed)
Looks like we last saw her 10/17/20 and then pt followed up with Cardiology at [facility] in October 2022.  ?If she is going to continue to see [facility] they can send refill. If we can call her to find out where she will continue cardiology care, if she will follow up we can send in 30 day. Thanks ?

## 2021-12-17 NOTE — Telephone Encounter (Signed)
Spoke with patient who states she is now following with Dr. Corky Sox at [facility].  ?

## 2021-12-18 MED ORDER — ORAL CARE MOUTH RINSE: 15.0000 mL | Freq: Once | OROMUCOSAL | Status: DC

## 2021-12-18 MED ORDER — SODIUM CHLORIDE 0.9 % IV SOLN: INTRAVENOUS | Status: DC

## 2021-12-18 MED ORDER — CEFAZOLIN SODIUM-DEXTROSE 2-4 GM/100ML-% IV SOLN
2.0000 g | INTRAVENOUS | Status: AC
Start: 2021-12-19 — End: 2021-12-19
  Administered 2021-12-19: 2 g via INTRAVENOUS

## 2021-12-18 MED ORDER — CHLORHEXIDINE GLUCONATE 0.12 % MT SOLN: 15.0000 mL | Freq: Once | OROMUCOSAL | Status: DC

## 2021-12-19 ENCOUNTER — Encounter
Admission: RE | Disposition: A | Discharge status: Home / Self Care | Payer: Self-pay | Source: Home / Self Care | Attending: Surgery

## 2021-12-19 ENCOUNTER — Ambulatory Visit: Payer: Medicare Other | Admitting: Anesthesiology

## 2021-12-19 ENCOUNTER — Other Ambulatory Visit: Payer: Self-pay

## 2021-12-19 ENCOUNTER — Ambulatory Visit
Admission: RE | Admit: 2021-12-19 | Discharge: 2021-12-19 | Disposition: A | Discharge status: Home / Self Care | Payer: Medicare Other | Attending: Surgery | Admitting: Surgery

## 2021-12-19 ENCOUNTER — Ambulatory Visit: Payer: Medicare Other

## 2021-12-19 ENCOUNTER — Encounter: Payer: Self-pay | Admitting: Surgery

## 2021-12-19 DIAGNOSIS — F32A Depression, unspecified: Secondary | ICD-10-CM | POA: Diagnosis not present

## 2021-12-19 DIAGNOSIS — M75122 Complete rotator cuff tear or rupture of left shoulder, not specified as traumatic: Secondary | ICD-10-CM | POA: Diagnosis not present

## 2021-12-19 DIAGNOSIS — N289 Disorder of kidney and ureter, unspecified: Secondary | ICD-10-CM | POA: Insufficient documentation

## 2021-12-19 DIAGNOSIS — M19012 Primary osteoarthritis, left shoulder: Secondary | ICD-10-CM | POA: Diagnosis not present

## 2021-12-19 DIAGNOSIS — M778 Other enthesopathies, not elsewhere classified: Secondary | ICD-10-CM | POA: Diagnosis not present

## 2021-12-19 DIAGNOSIS — Z8673 Personal history of transient ischemic attack (TIA), and cerebral infarction without residual deficits: Secondary | ICD-10-CM | POA: Insufficient documentation

## 2021-12-19 DIAGNOSIS — F419 Anxiety disorder, unspecified: Secondary | ICD-10-CM | POA: Insufficient documentation

## 2021-12-19 DIAGNOSIS — Z8249 Family history of ischemic heart disease and other diseases of the circulatory system: Secondary | ICD-10-CM | POA: Insufficient documentation

## 2021-12-19 DIAGNOSIS — D631 Anemia in chronic kidney disease: Secondary | ICD-10-CM | POA: Diagnosis not present

## 2021-12-19 DIAGNOSIS — I129 Hypertensive chronic kidney disease with stage 1 through stage 4 chronic kidney disease, or unspecified chronic kidney disease: Secondary | ICD-10-CM | POA: Insufficient documentation

## 2021-12-19 DIAGNOSIS — E039 Hypothyroidism, unspecified: Secondary | ICD-10-CM | POA: Diagnosis not present

## 2021-12-19 DIAGNOSIS — K219 Gastro-esophageal reflux disease without esophagitis: Secondary | ICD-10-CM | POA: Diagnosis not present

## 2021-12-19 DIAGNOSIS — I872 Venous insufficiency (chronic) (peripheral): Secondary | ICD-10-CM | POA: Diagnosis not present

## 2021-12-19 DIAGNOSIS — Z79899 Other long term (current) drug therapy: Secondary | ICD-10-CM | POA: Diagnosis not present

## 2021-12-19 DIAGNOSIS — N1831 Chronic kidney disease, stage 3a: Secondary | ICD-10-CM | POA: Diagnosis not present

## 2021-12-19 DIAGNOSIS — K449 Diaphragmatic hernia without obstruction or gangrene: Secondary | ICD-10-CM | POA: Insufficient documentation

## 2021-12-19 DIAGNOSIS — Z87891 Personal history of nicotine dependence: Secondary | ICD-10-CM | POA: Insufficient documentation

## 2021-12-19 DIAGNOSIS — M25812 Other specified joint disorders, left shoulder: Secondary | ICD-10-CM | POA: Diagnosis not present

## 2021-12-19 HISTORY — PX: SHOULDER ARTHROSCOPY WITH DEBRIDEMENT AND BICEP TENDON REPAIR: SHX5690

## 2021-12-19 LAB — POCT I-STAT, CHEM 8
BUN: 25 mg/dL — ABNORMAL HIGH (ref 8–23)
Calcium, Ion: 1.15 mmol/L (ref 1.15–1.40)
Chloride: 106 mmol/L (ref 98–111)
Creatinine, Ser: 1.2 mg/dL — ABNORMAL HIGH (ref 0.44–1.00)
Glucose, Bld: 93 mg/dL (ref 70–99)
HCT: 36 % (ref 36.0–46.0)
Hemoglobin: 12.2 g/dL (ref 12.0–15.0)
Potassium: 4.1 mmol/L (ref 3.5–5.1)
Sodium: 141 mmol/L (ref 135–145)
TCO2: 26 mmol/L (ref 22–32)

## 2021-12-19 SURGERY — SHOULDER ARTHROSCOPY WITH DEBRIDEMENT AND BICEP TENDON REPAIR
Anesthesia: General | Site: Shoulder | Laterality: Left

## 2021-12-19 MED ORDER — 0.9 % SODIUM CHLORIDE (POUR BTL) OPTIME
TOPICAL | Status: DC | PRN
  Administered 2021-12-19: 500 mL

## 2021-12-19 MED ORDER — ONDANSETRON HCL 4 MG/2ML IJ SOLN
INTRAMUSCULAR | Status: DC | PRN
  Administered 2021-12-19: 4 mg via INTRAVENOUS

## 2021-12-19 MED ORDER — ACETAMINOPHEN 10 MG/ML IV SOLN
INTRAVENOUS | Status: AC
  Administered 2021-12-19: 1000 mg via INTRAVENOUS
  Filled 2021-12-19: qty 100

## 2021-12-19 MED ORDER — GLYCOPYRROLATE 0.2 MG/ML IJ SOLN
INTRAMUSCULAR | Status: DC | PRN
  Administered 2021-12-19: .2 mg via INTRAVENOUS

## 2021-12-19 MED ORDER — ROCURONIUM BROMIDE 100 MG/10ML IV SOLN
INTRAVENOUS | Status: DC | PRN
  Administered 2021-12-19: 20 mg via INTRAVENOUS
  Administered 2021-12-19: 30 mg via INTRAVENOUS

## 2021-12-19 MED ORDER — FENTANYL CITRATE (PF) 100 MCG/2ML IJ SOLN
INTRAMUSCULAR | Status: AC
  Filled 2021-12-19: qty 2

## 2021-12-19 MED ORDER — LIDOCAINE HCL (CARDIAC) PF 100 MG/5ML IV SOSY
PREFILLED_SYRINGE | INTRAVENOUS | Status: DC | PRN
  Administered 2021-12-19: 60 mg via INTRAVENOUS

## 2021-12-19 MED ORDER — FENTANYL CITRATE PF 50 MCG/ML IJ SOSY: 50.0000 ug | PREFILLED_SYRINGE | Freq: Once | INTRAMUSCULAR | Status: AC

## 2021-12-19 MED ORDER — BUPIVACAINE LIPOSOME 1.3 % IJ SUSP
INTRAMUSCULAR | Status: DC | PRN
  Administered 2021-12-19: 10 mL via PERINEURAL

## 2021-12-19 MED ORDER — OXYCODONE HCL 5 MG/5ML PO SOLN: 5.0000 mg | Freq: Once | ORAL | Status: DC | PRN

## 2021-12-19 MED ORDER — EPINEPHRINE PF 1 MG/ML IJ SOLN
INTRAMUSCULAR | Status: AC
  Filled 2021-12-19: qty 2

## 2021-12-19 MED ORDER — ROCURONIUM BROMIDE 10 MG/ML (PF) SYRINGE
PREFILLED_SYRINGE | INTRAVENOUS | Status: AC
  Filled 2021-12-19: qty 10

## 2021-12-19 MED ORDER — LIDOCAINE HCL (PF) 2 % IJ SOLN
INTRAMUSCULAR | Status: AC
  Filled 2021-12-19: qty 5

## 2021-12-19 MED ORDER — CHLORHEXIDINE GLUCONATE 0.12 % MT SOLN
OROMUCOSAL | Status: AC
  Filled 2021-12-19: qty 15

## 2021-12-19 MED ORDER — BUPIVACAINE HCL (PF) 0.5 % IJ SOLN
INTRAMUSCULAR | Status: AC
  Filled 2021-12-19: qty 10

## 2021-12-19 MED ORDER — FENTANYL CITRATE (PF) 100 MCG/2ML IJ SOLN
INTRAMUSCULAR | Status: DC | PRN
  Administered 2021-12-19: 50 ug via INTRAVENOUS

## 2021-12-19 MED ORDER — PHENYLEPHRINE 40 MCG/ML (10ML) SYRINGE FOR IV PUSH (FOR BLOOD PRESSURE SUPPORT)
PREFILLED_SYRINGE | INTRAVENOUS | Status: DC | PRN
  Administered 2021-12-19: 100 ug via INTRAVENOUS
  Administered 2021-12-19: 40 ug via INTRAVENOUS
  Administered 2021-12-19: 80 ug via INTRAVENOUS

## 2021-12-19 MED ORDER — OXYCODONE HCL 5 MG PO TABS: 5.0000 mg | ORAL_TABLET | ORAL | 0 refills | Status: DC | PRN

## 2021-12-19 MED ORDER — DEXAMETHASONE SODIUM PHOSPHATE 10 MG/ML IJ SOLN
INTRAMUSCULAR | Status: DC | PRN
  Administered 2021-12-19: 8 mg via INTRAVENOUS

## 2021-12-19 MED ORDER — ACETAMINOPHEN 10 MG/ML IV SOLN: 1000.0000 mg | Freq: Once | INTRAVENOUS | Status: DC | PRN

## 2021-12-19 MED ORDER — LACTATED RINGERS IV SOLN
INTRAVENOUS | Status: DC | PRN
  Administered 2021-12-19: 1500 mL

## 2021-12-19 MED ORDER — FENTANYL CITRATE PF 50 MCG/ML IJ SOSY
PREFILLED_SYRINGE | INTRAMUSCULAR | Status: AC
  Administered 2021-12-19: 25 ug via INTRAVENOUS
  Filled 2021-12-19: qty 1

## 2021-12-19 MED ORDER — CEFAZOLIN SODIUM-DEXTROSE 2-4 GM/100ML-% IV SOLN
INTRAVENOUS | Status: AC
  Filled 2021-12-19: qty 100

## 2021-12-19 MED ORDER — SUCCINYLCHOLINE CHLORIDE 200 MG/10ML IV SOSY
PREFILLED_SYRINGE | INTRAVENOUS | Status: AC
  Filled 2021-12-19: qty 10

## 2021-12-19 MED ORDER — BUPIVACAINE HCL (PF) 0.5 % IJ SOLN
INTRAMUSCULAR | Status: DC | PRN
  Administered 2021-12-19: 10 mL via PERINEURAL

## 2021-12-19 MED ORDER — PHENYLEPHRINE HCL-NACL 20-0.9 MG/250ML-% IV SOLN
INTRAVENOUS | Status: DC | PRN
  Administered 2021-12-19: 20 ug/min via INTRAVENOUS

## 2021-12-19 MED ORDER — BUPIVACAINE-EPINEPHRINE 0.5% -1:200000 IJ SOLN
INTRAMUSCULAR | Status: DC | PRN
  Administered 2021-12-19: 30 mL

## 2021-12-19 MED ORDER — LIDOCAINE HCL (PF) 1 % IJ SOLN
INTRAMUSCULAR | Status: DC | PRN
  Administered 2021-12-19: 1 mL via SUBCUTANEOUS

## 2021-12-19 MED ORDER — BUPIVACAINE LIPOSOME 1.3 % IJ SUSP
INTRAMUSCULAR | Status: AC
  Filled 2021-12-19: qty 20

## 2021-12-19 MED ORDER — FENTANYL CITRATE (PF) 100 MCG/2ML IJ SOLN: 25.0000 ug | INTRAMUSCULAR | Status: DC | PRN

## 2021-12-19 MED ORDER — GLYCOPYRROLATE 0.2 MG/ML IJ SOLN
INTRAMUSCULAR | Status: AC
  Filled 2021-12-19: qty 1

## 2021-12-19 MED ORDER — CLOPIDOGREL BISULFATE 75 MG PO TABS: 75.0000 mg | ORAL_TABLET | ORAL | Status: AC

## 2021-12-19 MED ORDER — BUPIVACAINE-EPINEPHRINE (PF) 0.5% -1:200000 IJ SOLN
INTRAMUSCULAR | Status: AC
  Filled 2021-12-19: qty 30

## 2021-12-19 MED ORDER — SUGAMMADEX SODIUM 200 MG/2ML IV SOLN
INTRAVENOUS | Status: DC | PRN
  Administered 2021-12-19: 200 mg via INTRAVENOUS

## 2021-12-19 MED ORDER — LIDOCAINE HCL (PF) 1 % IJ SOLN
INTRAMUSCULAR | Status: AC
Start: 2021-12-19 — End: 2021-12-19
  Filled 2021-12-19: qty 5

## 2021-12-19 MED ORDER — PROPOFOL 10 MG/ML IV BOLUS
INTRAVENOUS | Status: AC
  Filled 2021-12-19: qty 20

## 2021-12-19 MED ORDER — PROPOFOL 10 MG/ML IV BOLUS
INTRAVENOUS | Status: DC | PRN
  Administered 2021-12-19: 150 mg via INTRAVENOUS

## 2021-12-19 MED ORDER — OXYCODONE HCL 5 MG PO TABS: 5.0000 mg | ORAL_TABLET | Freq: Once | ORAL | Status: DC | PRN

## 2021-12-19 SURGICAL SUPPLY — 64 items
ANCH SUT 2 JK 1.5X2.9 2 LD (Anchor) ×1 IMPLANT
ANCH SUT 5.5 KNTLS (Anchor) ×2 IMPLANT
ANCH SUT BN ASCP DLV (Anchor) ×1 IMPLANT
ANCH SUT Q-FX 2.8 (Anchor) ×2 IMPLANT
ANCH SUT RGNRT REGENETEN (Staple) ×1 IMPLANT
ANCHOR ALL-SUT Q-FIX 2.8 (Anchor) ×2 IMPLANT
ANCHOR BONE REGENETEN (Anchor) ×1 IMPLANT
ANCHOR HEALICOIL REGEN 5.5 (Anchor) ×2 IMPLANT
ANCHOR SUT JK SZ 2 2.9 DBL SL (Anchor) ×1 IMPLANT
ANCHOR TENDON REGENETEN (Staple) ×1 IMPLANT
APL PRP STRL LF DISP 70% ISPRP (MISCELLANEOUS) ×2
BIT DRILL JUGRKNT W/NDL BIT2.9 (DRILL) IMPLANT
BLADE FULL RADIUS 3.5 (BLADE) ×2 IMPLANT
BUR ACROMIONIZER 4.0 (BURR) ×2 IMPLANT
CANNULA SHAVER 8MMX76MM (CANNULA) ×2 IMPLANT
CHLORAPREP W/TINT 26 (MISCELLANEOUS) ×3 IMPLANT
COVER MAYO STAND REUSABLE (DRAPES) ×2 IMPLANT
DILATOR 5.5 THREADED HEALICOIL (MISCELLANEOUS) ×1 IMPLANT
DRILL JUGGERKNOT W/NDL BIT 2.9 (DRILL) ×2
ELECT CAUTERY BLADE 6.4 (BLADE) ×2 IMPLANT
ELECT REM PT RETURN 9FT ADLT (ELECTROSURGICAL) ×2
ELECTRODE REM PT RTRN 9FT ADLT (ELECTROSURGICAL) ×1 IMPLANT
GAUZE SPONGE 4X4 12PLY STRL (GAUZE/BANDAGES/DRESSINGS) ×2 IMPLANT
GAUZE XEROFORM 1X8 LF (GAUZE/BANDAGES/DRESSINGS) ×2 IMPLANT
GLOVE SRG 8 PF TXTR STRL LF DI (GLOVE) ×1 IMPLANT
GLOVE SURG ENC MOIS LTX SZ7.5 (GLOVE) ×4 IMPLANT
GLOVE SURG ENC MOIS LTX SZ8 (GLOVE) ×4 IMPLANT
GLOVE SURG UNDER LTX SZ8 (GLOVE) ×2 IMPLANT
GLOVE SURG UNDER POLY LF SZ8 (GLOVE) ×2
GOWN STRL REUS W/ TWL LRG LVL3 (GOWN DISPOSABLE) ×1 IMPLANT
GOWN STRL REUS W/ TWL XL LVL3 (GOWN DISPOSABLE) ×1 IMPLANT
GOWN STRL REUS W/TWL LRG LVL3 (GOWN DISPOSABLE) ×4
GOWN STRL REUS W/TWL XL LVL3 (GOWN DISPOSABLE) ×2
GRASPER SUT 15 45D LOW PRO (SUTURE) IMPLANT
IMPL REGENETEN MEDIUM (Shoulder) IMPLANT
IMPLANT REGENETEN MEDIUM (Shoulder) ×2 IMPLANT
IV LACTATED RINGER IRRG 3000ML (IV SOLUTION) ×2
IV LR IRRIG 3000ML ARTHROMATIC (IV SOLUTION) ×2 IMPLANT
KIT CANNULA 8X76-LX IN CANNULA (CANNULA) IMPLANT
KIT SUTURE 2.8 Q-FIX DISP (MISCELLANEOUS) ×1 IMPLANT
MANIFOLD NEPTUNE II (INSTRUMENTS) ×4 IMPLANT
MASK FACE SPIDER DISP (MASK) ×2 IMPLANT
MAT ABSORB  FLUID 56X50 GRAY (MISCELLANEOUS) ×2
MAT ABSORB FLUID 56X50 GRAY (MISCELLANEOUS) ×1 IMPLANT
NDL SPNL 18GX3.5 QUINCKE PK (NEEDLE) IMPLANT
NEEDLE SPNL 18GX3.5 QUINCKE PK (NEEDLE) ×2 IMPLANT
PACK ARTHROSCOPY SHOULDER (MISCELLANEOUS) ×2 IMPLANT
PAD ABD DERMACEA PRESS 5X9 (GAUZE/BANDAGES/DRESSINGS) ×4 IMPLANT
PASSER SUT FIRSTPASS SELF (INSTRUMENTS) ×1 IMPLANT
SLING ARM LRG DEEP (SOFTGOODS) ×1 IMPLANT
SLING ULTRA II LG (MISCELLANEOUS) ×1 IMPLANT
SLING ULTRA II M (MISCELLANEOUS) ×1 IMPLANT
SPONGE T-LAP 18X18 ~~LOC~~+RFID (SPONGE) ×2 IMPLANT
STAPLER SKIN PROX 35W (STAPLE) ×2 IMPLANT
STRAP SAFETY 5IN WIDE (MISCELLANEOUS) ×2 IMPLANT
SUT ETHIBOND 0 MO6 C/R (SUTURE) ×2 IMPLANT
SUT ULTRABRAID 2 COBRAID 38 (SUTURE) IMPLANT
SUT VIC AB 2-0 CT1 27 (SUTURE) ×4
SUT VIC AB 2-0 CT1 TAPERPNT 27 (SUTURE) ×2 IMPLANT
TAPE MICROFOAM 4IN (TAPE) ×2 IMPLANT
TUBING CONNECTING 10 (TUBING) ×2 IMPLANT
TUBING INFLOW SET DBFLO PUMP (TUBING) ×2 IMPLANT
WAND WEREWOLF FLOW 90D (MISCELLANEOUS) ×2 IMPLANT
WATER STERILE IRR 500ML POUR (IV SOLUTION) ×2 IMPLANT

## 2021-12-19 NOTE — Anesthesia Preprocedure Evaluation (Addendum)
Anesthesia Evaluation  ?Patient identified by MRN, date of birth, ID band ?Patient awake ? ? ? ?Reviewed: ?Allergy & Precautions, NPO status , Patient's Chart, lab work & pertinent test results ? ?Airway ?Mallampati: III ? ?TM Distance: >3 FB ?Neck ROM: Full ? ? ? Dental ? ?(+) Edentulous Upper, Edentulous Lower ?  ?Pulmonary ?former smoker,  ?  ?Pulmonary exam normal ? ? ? ? ? ? ? Cardiovascular ?hypertension, Pt. on medications ?+ dysrhythmias (PSVT)  ?+ Peripheral Edema ?Chronic venous insufficiency of lower extremity ? ?She has a normal EF, and a normal stress test ?  ?Neuro/Psych ?PSYCHIATRIC DISORDERS Anxiety Depression Bell's Palsy ?TIA (2020)  ? GI/Hepatic ?hiatal hernia (LARGE), GERD  Medicated and Controlled,AVM (arteriovenous malformation) of colon  ?  ?Endo/Other  ?Hypothyroidism H/o hyperkalemia  ?H/o hypercalcemia ? ? Renal/GU ?Renal InsufficiencyRenal disease  ? ?  ?Musculoskeletal ? ? Abdominal ?Normal abdominal exam  (+)   ?Peds ? Hematology ? ?(+) Blood dyscrasia, anemia , hemoglobin stable, 11.2 ? ?Anemia of chronic disease   ?Anesthesia Other Findings ? ? Reproductive/Obstetrics ? ?  ? ? ? ? ? ? ? ? ? ? ? ? ? ?  ?  ? ? ? ? ? ? ?Anesthesia Physical ?Anesthesia Plan ? ?ASA: 3 ? ?Anesthesia Plan: General  ? ?Post-op Pain Management: Regional block* and Ofirmev IV (intra-op)*  ? ?Induction: Intravenous ? ?PONV Risk Score and Plan: Ondansetron, Dexamethasone and Treatment may vary due to age or medical condition ? ?Airway Management Planned: Oral ETT ? ?Additional Equipment:  ? ?Intra-op Plan:  ? ?Post-operative Plan: Extubation in OR ? ?Informed Consent: I have reviewed the patients History and Physical, chart, labs and discussed the procedure including the risks, benefits and alternatives for the proposed anesthesia with the patient or authorized representative who has indicated his/her understanding and acceptance.  ? ? ? ?Dental advisory given ? ?Plan Discussed  with: CRNA and Anesthesiologist ? ?Anesthesia Plan Comments:   ? ? ? ? ?Anesthesia Quick Evaluation ? ?

## 2021-12-19 NOTE — Op Note (Signed)
12/19/2021 ? ?3:08 PM ? ?Patient:   Carla Lyons ? ?Pre-Op Diagnosis:   Impingement/tendinopathy with rotator cuff tear, left shoulder. ? ?Post-Op Diagnosis:   Impingement/tendinopathy with rotator cuff tear, degenerative labral fraying, and biceps tendinopathy, left shoulder. ? ?Procedure:   Limited arthroscopic debridement, arthroscopic subacromial decompression, mini-open rotator cuff repair supplemented with Smith & Nephew Regeneten patch, and mini-open biceps tenodesis, left shoulder. ? ?Anesthesia:   General endotracheal with interscalene block using Exparel placed preoperatively by the anesthesiologist. ? ?Surgeon:   Pascal Lux, MD ? ?Assistant:   Cameron Proud, PA-C ? ?Findings:   As above.  There was a full-thickness tear involving the entire supraspinatus tendon measuring approximately 2.5 x 2.5 cm in a reverse L-shaped pattern.  The remainder the rotator cuff was in satisfactory condition, as were the articular surfaces of the glenoid and humerus.  There was fraying of the anterior and superior portions of the labrum without frank detachment from the glenoid rim.  The biceps tendon demonstrated moderate tendinopathic changes with fraying near its labral insertion, but did not exhibit any partial or full-thickness tears. ? ?Complications:   None ? ?Fluids:   600 cc ? ?Estimated blood loss:   10 cc ? ?Tourniquet time:   None ? ?Drains:   None ? ?Closure:   Staples     ? ?Brief clinical note:   The patient is a 81 year old female with a history of progressively worsening left shoulder pain. The patient's symptoms have progressed despite medications, activity modification, etc. The patient's history and examination are consistent with impingement/tendinopathy with a rotator cuff tear. These findings were confirmed by MRI scan. The patient presents at this time for definitive management of these shoulder symptoms. ? ?Procedure:   The patient underwent placement of an interscalene block using Exparel by the  anesthesiologist in the preoperative holding area before being brought into the operating room and lain in the supine position. The patient then underwent general endotracheal intubation and anesthesia before being repositioned in the beach chair position using the beach chair positioner. The left shoulder and upper extremity were prepped with ChloraPrep solution before being draped sterilely. Preoperative antibiotics were administered. A timeout was performed to confirm the proper surgical site before the expected portal sites and incision site were injected with 0.5% Sensorcaine with epinephrine.  ? ?A posterior portal was created and the glenohumeral joint thoroughly inspected with the findings as described above. An anterior portal was created using an outside-in technique. The labrum and rotator cuff were further probed, again confirming the above-noted findings. The areas of degenerative labral fraying were debrided back to stable margins using the full-radius resector, as were areas of synovial proliferation. The ArthroCare wand was inserted and used to release the biceps tendon from its labral anchor. It also was used to obtain hemostasis as well as to "anneal" the labrum superiorly and anteriorly. The instruments were removed from the joint after suctioning the excess fluid. ? ?The camera was repositioned through the posterior portal into the subacromial space. A separate lateral portal was created using an outside-in technique. The 3.5 mm full-radius resector was introduced and used to perform a subtotal bursectomy. The ArthroCare wand was then inserted and used to remove the periosteal tissue off the undersurface of the anterior third of the acromion as well as to recess the coracoacromial ligament from its attachment along the anterior and lateral margins of the acromion. The 4.0 mm acromionizing bur was introduced and used to complete the decompression by removing the undersurface  of the anterior third  of the acromion. The full radius resector was reintroduced to remove any residual bony debris before the ArthroCare wand was reintroduced to obtain hemostasis. The instruments were then removed from the subacromial space after suctioning the excess fluid. ? ?An approximately 4-5 cm incision was made over the anterolateral aspect of the shoulder beginning at the anterolateral corner of the acromion and extending distally in line with the bicipital groove. This incision was carried down through the subcutaneous tissues to expose the deltoid fascia. The raphae between the anterior and middle thirds was identified and this plane developed to provide access into the subacromial space. Additional bursal tissues were debrided sharply using Metzenbaum scissors. The rotator cuff tear was readily identified. The margins were debrided sharply with a #15 blade and the exposed greater tuberosity roughened with a rongeur. The tear was repaired using several #0 Ethibond interrupted sutures placed in a side-to-side fashion to close the longitudinal portion of the tear. More laterally, two Smith & Nephew 2.8 mm Q-Fix anchors were placed and used to complete the closure of the torn rotator cuff. These sutures were then brought back laterally and secured using two [place] knotless RegeneSorb anchors to create a two-layer closure. An apparent watertight closure was obtained. ? ?Given the patient's age and quality of rotator cuff tissue involved in the repair, it was felt best to reinforce this tissue with a Marinette patch. This portion of the procedure also added an additional level of complexity to the case and took an additional 20 minutes to complete. A medium sized patch was selected and secured over the repaired portion of the rotator cuff using the appropriate bone and soft tissue staples. ? ?The bicipital groove was identified by palpation and opened for 1-1.5 cm. The biceps tendon stump was  retrieved through this defect. The floor of the bicipital groove was roughened with a curet before another Biomet 2.9 mm JuggerKnot anchor was inserted. Both sets of sutures were passed through the biceps tendon and tied securely to effect the tenodesis. The bicipital sheath was reapproximated using two #0 Ethibond interrupted sutures, incorporating the biceps tendon to further reinforce the tenodesis. ? ?The wound was copiously irrigated with sterile saline solution before the deltoid raphae was reapproximated using 2-0 Vicryl interrupted sutures. The subcutaneous tissues were closed in two layers using 2-0 Vicryl interrupted sutures before the skin was closed using staples. The portal sites also were closed using staples. A sterile bulky dressing was applied to the shoulder before the arm was placed into a shoulder immobilizer. The patient was then awakened, extubated, and returned to the recovery room in satisfactory condition after tolerating the procedure well. ?

## 2021-12-19 NOTE — Anesthesia Procedure Notes (Signed)
Anesthesia Regional Block: Interscalene brachial plexus block  ? ?Pre-Anesthetic Checklist: , timeout performed,  Correct Patient, Correct Site, Correct Laterality,  Correct Procedure, Correct Position, site marked,  Risks and benefits discussed,  Surgical consent,  Pre-op evaluation,  At surgeon's request and post-op pain management ? ?Laterality: Upper and Left ? ?Prep: chloraprep     ?  ?Needles:  ?Injection technique: Single-shot ? ?Needle Type: Stimiplex   ? ? ?Needle Length: 9cm  ?Needle Gauge: 22  ? ? ? ?Additional Needles: ? ? ?Procedures:,,,, ultrasound used (permanent image in chart),,    ?Narrative:  ?Start time: 12/19/2021 1:02 PM ?End time: 12/19/2021 1:05 PM ?Injection made incrementally with aspirations every 20 mL. ? ?Performed by: Personally  ?Anesthesiologist: Iran Ouch, MD ? ?Additional Notes: ?Patient consented for risk and benefits of nerve block including but not limited to nerve damage, failed block, bleeding and infection.  Patient voiced understanding. ? ?Functioning IV was confirmed and monitors were applied.  Timeout done prior to procedure and prior to any sedation being given to the patient.  Patient confirmed procedure site prior to any sedation given to the patient.  A 109m 22ga Stimuplex needle was used. Sterile prep,hand hygiene and sterile gloves were used.  Minimal sedation used for procedure.  No paresthesia endorsed by patient during the procedure.  Negative aspiration and negative test dose prior to incremental administration of local anesthetic. The patient tolerated the procedure well with no immediate complications. ? ? ? ?

## 2021-12-19 NOTE — H&P (Signed)
History of Present Illness: ?Carla Lyons is a 81 y.o. female who presents today for her surgical history and physical for upcoming left shoulder arthroscopy with debridement, decompression, rotator cuff repair and biceps tenodesis. Surgery is scheduled with Dr. Roland Rack on 12/19/2021. There has not been any changes in her medical history since she was last evaluated. The patient denies any personal history of heart attack, stroke, asthma or COPD. The patient does take Plavix on Monday, Wednesday and Friday. She was instructed to take her dose today but this will beher last Plavix until following surgery. The patient reports a pain score of 5 out of 10 to the left upper extremity at today's visit. She denies any falls or trauma since she was last evaluated. She denies any numbness or ting of the left upper extremity at today's visit. ? ?Past Medical History: ? Anemia  ? AVM (arteriovenous malformation) of colon  ? Colon polyps  ? Colonic adenoma  ? Diverticulosis  ? Hyperlipidemia  ? Hypertension  ? Osteoporosis  ? Renal insufficiency  ? Thyroid disease  ? ?Past Surgical History: ? Cataract extraction w/ intraocular lens implant (Right) 01/08/2018  ? Cataract extraction w/ intraocular lens implant (Left) 02/05/2018  ? APPENDECTOMY  ? CESAREAN SECTION x2  ? Partial hysterectomy surgery  ? VAGINAL HYSTERECTOMY  ? ?Past Family History: ? Heart disease Mother  ? Dementia Mother  ? Aneurysm Father (stomach)  ? Myocardial Infarction (Heart attack) Father  ? Pulmonary fibrosis Sister  ? Pulmonary fibrosis Sister  ? Pulmonary fibrosis Brother  ? ?Medications: ? acetaminophen (TYLENOL) 500 MG tablet 1-2 tables as needed  ? amLODIPine (NORVASC) 5 MG tablet Take 5 mg by mouth once daily  ? atorvastatin (LIPITOR) 80 MG tablet Take 80 mg by mouth once daily  ? benazepril-hydrochlorthiazide (LOTENSIN HCT) 10-12.5 mg tablet Take 1 tablet by mouth once daily 30 tablet 11  ? biotin 5 mg capsule Take 1 capsule by mouth once daily  ?  cholecalciferol (VITAMIN D3) 1000 unit tablet Take 1,000 Units by mouth 2 (two) times daily  ? clopidogreL (PLAVIX) 75 mg tablet Take 1 tablet (75 mg total) by mouth Take on Monday, Wednesday and Friday  ? dapagliflozin (FARXIGA) 10 mg tablet Take 10 mg by mouth once daily  ? ferrous sulfate 325 (65 FE) MG tablet Take by mouth Take 325 mg by mouth at bedtime.  ? FUROsemide (LASIX) 20 MG tablet Take 20 mg by mouth as needed  ? levothyroxine (SYNTHROID) 88 MCG tablet Take 1 tablet by mouth once daily  ? omeprazole (PRILOSEC) 40 MG DR capsule Take 40 mg by mouth once daily  ? TOBRADEX ophthalmic ointment Apply topically every 8 (eight) hours as needed  ? vitamin B complex (B COMPLEX VITAMINS ORAL) Take by mouth  ? ezetimibe (ZETIA) 10 mg tablet Take 1 tablet (10 mg total) by mouth once daily (Patient not taking: Reported on 12/13/2021) 30 tablet 11  ? ?Allergies: ? Alendronate Sodium (Red and hot all over)  ? Niacin (Rash and red all over)  ? Codeine Phosphate (Nausea/vomiting)  ? ?Review of Systems:  ?A comprehensive 14 point ROS was performed, reviewed by me today, and the pertinent orthopaedic findings are documented in the HPI. ? ?Physical Exam: ?BP (!) 154/82 (BP Location: Right upper arm)  Ht 160 cm ('5\' 3"'$ )  Wt 66.2 kg (146 lb)  BMI 25.86 kg/m?  ?General/Constitutional: The patient appears to be well-nourished, well-developed, and in no acute distress. ?Neuro/Psych: Normal mood and affect, oriented to  person, place and time. ?Eyes: Non-icteric. Pupils are equal, round, and reactive to light, and exhibit synchronous movement. ?ENT: Unremarkable. ?Lymphatic: No palpable adenopathy. ?Respiratory: Lungs clear to auscultation, Normal chest excursion, No wheezes and Non-labored breathing ?Cardiovascular: Regular rate and rhythm. No murmurs. and No edema, swelling or tenderness, except as noted in detailed exam. ?Integumentary: No impressive skin lesions present, except as noted in detailed exam. ?Musculoskeletal:  Unremarkable, except as noted in detailed exam. ? ?Left shoulder exam: ?SKIN: normal ?SWELLING: none ?WARMTH: none ?LYMPH NODES: no adenopathy palpable ?CREPITUS: none ?TENDERNESS: Mildly tender along the lateral acromion. ?ROM (active):  ?Forward flexion: 135 degrees ?Abduction: 125 degrees ?Internal rotation: L1 ?ROM (passive):  ?Forward flexion: 150 degrees ?Abduction: 140 degrees  ?ER/IR at 90 abd: 85 degrees / 60 degrees ?  ?She experiences moderate pain with forward flexion and abduction, mild pain with all other motions. ?  ?STRENGTH: Forward flexion: 4/5 ?Abduction: 3+-4/5 ?External rotation: 4/5 ?Internal rotation: 4-4+/5 ?Pain with RC testing: Mild pain with resisted abduction more so than with resisted forward flexion or external rotation. ?  ?STABILITY: Normal ?  ?SPECIAL TESTS: Luan Pulling' test: positive, mild ?Speed's test: Mildly positive ?Capsulitis - pain w/ passive ER: no ?Crossed arm test: no ?Crank: Not evaluated ?Anterior apprehension: Negative ?Posterior apprehension: Not evaluated ?  ?She is neurovascularly intact to the left upper extremity. ? ?MRI OF THE LEFT SHOULDER WITHOUT CONTRAST:  ?Rotator cuff tendinopathy with a 1.6 cm from front to back near  ?full-thickness tear of the anterior supraspinatus. Only a thin  ?strand of intact tendon is present. Retraction is 2.5-3.5 cm. No  ?atrophy.  ? ?Moderate acromioclavicular osteoarthritis.  ? ?Moderate volume of subacromial/subdeltoid fluid consistent with  ?bursitis.  ? ?Impression: ?1. Nontraumatic complete tear of left rotator cuff ?2. Rotator cuff tendinitis, left ? ?Plan:  ?1. Treatment options were discussed today with the patient. ?2. The patient is scheduled for a left shoulder arthroscopy with debridement, decompression, rotator cuff repair and biceps tenodesis. Surgery scheduled with Dr. Roland Rack on 12/19/2021. ?3. The patient will take her last dose of Plavix prior to surgery today. ?4. She was instructed on the risk and benefits of surgery  and wishes to proceed at this time. This document will serve as a surgical history and physical for the patient. ?5. The patient will follow-up per standard postop protocol. They can call the clinic they have any questions, new symptoms develop or symptoms worsen. ? ?The procedure was discussed with the patient, as were the potential risks (including bleeding, infection, nerve and/or blood vessel injury, persistent or recurrent pain, failure of the repair, progression of arthritis, need for further surgery, blood clots, strokes, heart attacks and/or arhythmias, pneumonia, etc.) and benefits. The patient states her understanding and wishes to proceed. ? ? ?H&P reviewed and patient re-examined. No changes. ? ?

## 2021-12-19 NOTE — Anesthesia Procedure Notes (Signed)
Procedure Name: Intubation ?Date/Time: 12/19/2021 1:26 PM ?Performed by: Jonna Clark, CRNA ?Pre-anesthesia Checklist: Patient identified, Patient being monitored, Timeout performed, Emergency Drugs available and Suction available ?Patient Re-evaluated:Patient Re-evaluated prior to induction ?Oxygen Delivery Method: Circle system utilized ?Preoxygenation: Pre-oxygenation with 100% oxygen ?Induction Type: IV induction ?Ventilation: Mask ventilation without difficulty ?Laryngoscope Size: 3 and McGraph ?Grade View: Grade I ?Tube type: Oral ?Tube size: 7.0 mm ?Number of attempts: 1 ?Airway Equipment and Method: Stylet ?Placement Confirmation: ETT inserted through vocal cords under direct vision, positive ETCO2 and breath sounds checked- equal and bilateral ?Secured at: 21 cm ?Tube secured with: Tape ?Dental Injury: Teeth and Oropharynx as per pre-operative assessment  ? ? ? ? ?

## 2021-12-19 NOTE — Transfer of Care (Signed)
Immediate Anesthesia Transfer of Care Note ? ?Patient: Carla Lyons ? ?Procedure(s) Performed: Left shoulder arthroscopy with debridment, decompression, rotator cuff repair and biceps tenodesis (Left: Shoulder) ? ?Patient Location: PACU ? ?Anesthesia Type:General ? ?Level of Consciousness: drowsy ? ?Airway & Oxygen Therapy: Patient Spontanous Breathing and Patient connected to face mask oxygen ? ?Post-op Assessment: Report given to RN ? ?Post vital signs: stable ? ?Last Vitals:  ?Vitals Value Taken Time  ?BP 133/69 12/19/21 1520  ?Temp    ?Pulse 78 12/19/21 1524  ?Resp 35 12/19/21 1524  ?SpO2 98 % 12/19/21 1524  ?Vitals shown include unvalidated device data. ? ?Last Pain:  ?Vitals:  ? 12/19/21 1305  ?TempSrc:   ?PainSc: 0-No pain  ?   ? ?  ? ?Complications: No notable events documented. ?

## 2021-12-19 NOTE — Discharge Instructions (Addendum)
Orthopedic discharge instructions: ?Keep dressing dry and intact.  ?May shower after dressing changed on post-op day #4 (Monday).  ?Cover staples/sutures with Band-Aids after drying off. ?Apply ice frequently to shoulder. ?Take oxycodone as prescribed when needed.  ?May supplement with ES Tylenol if necessary. ?May resume Plavix tomorrow. ?Keep shoulder immobilizer on at all times except may remove for bathing purposes. ?Follow-up in 10-14 days or as scheduled. ?

## 2021-12-20 ENCOUNTER — Encounter: Payer: Self-pay | Admitting: Surgery

## 2021-12-20 NOTE — Anesthesia Postprocedure Evaluation (Signed)
Anesthesia Post Note ? ?Patient: Sallee Lange ? ?Procedure(s) Performed: Left shoulder arthroscopy with debridment, decompression, rotator cuff repair and biceps tenodesis (Left: Shoulder) ? ?Patient location during evaluation: PACU ?Anesthesia Type: General ?Level of consciousness: awake and alert ?Pain management: pain level controlled ?Vital Signs Assessment: post-procedure vital signs reviewed and stable ?Respiratory status: spontaneous breathing, nonlabored ventilation and respiratory function stable ?Cardiovascular status: blood pressure returned to baseline and stable ?Postop Assessment: no apparent nausea or vomiting ?Anesthetic complications: no ? ? ?No notable events documented. ? ? ?Last Vitals:  ?Vitals:  ? 12/19/21 1630 12/19/21 1646  ?BP: 113/67 121/61  ?Pulse: 64 72  ?Resp: 14 14  ?Temp: (!) 36.3 ?C (!) 36.1 ?C  ?SpO2: 93%   ?  ?Last Pain:  ?Vitals:  ? 12/19/21 1646  ?TempSrc: Temporal  ?PainSc: 0-No pain  ? ? ?  ?  ?  ?  ?  ?  ? ?Iran Ouch ? ? ? ? ?

## 2021-12-28 ENCOUNTER — Other Ambulatory Visit: Payer: Self-pay | Admitting: Internal Medicine

## 2021-12-28 DIAGNOSIS — E782 Mixed hyperlipidemia: Secondary | ICD-10-CM

## 2021-12-30 NOTE — Telephone Encounter (Signed)
Please schedule overdue F/U appointment for refills. Thank you! 

## 2021-12-30 NOTE — Telephone Encounter (Signed)
Patient declined to schedule at this time due to recent procedure.  Patient will call when ready.   ?

## 2022-01-24 ENCOUNTER — Other Ambulatory Visit: Payer: Self-pay | Admitting: Internal Medicine

## 2022-01-24 DIAGNOSIS — E782 Mixed hyperlipidemia: Secondary | ICD-10-CM

## 2022-02-10 ENCOUNTER — Other Ambulatory Visit: Payer: Self-pay | Admitting: Internal Medicine

## 2022-02-10 DIAGNOSIS — E782 Mixed hyperlipidemia: Secondary | ICD-10-CM

## 2022-02-21 ENCOUNTER — Other Ambulatory Visit: Payer: Self-pay | Admitting: Internal Medicine

## 2022-02-21 DIAGNOSIS — E782 Mixed hyperlipidemia: Secondary | ICD-10-CM

## 2022-02-25 ENCOUNTER — Other Ambulatory Visit: Payer: Self-pay | Admitting: Internal Medicine

## 2022-02-25 ENCOUNTER — Telehealth: Payer: Self-pay | Admitting: Internal Medicine

## 2022-02-25 DIAGNOSIS — E782 Mixed hyperlipidemia: Secondary | ICD-10-CM

## 2022-02-25 NOTE — Telephone Encounter (Signed)
*  STAT* If patient is at the pharmacy, call can be transferred to refill team.   1. Which medications need to be refilled? (please list name of each medication and dose if known) amLODipine (NORVASC) 5 MG tablet  furosemide (LASIX) 20 MG tablet  2. Which pharmacy/location (including street and city if local pharmacy) is medication to be sent to? [id number]- [place], [place] - [address]  3. Do they need a 30 day or 90 day supply? [address]

## 2022-03-06 ENCOUNTER — Other Ambulatory Visit: Payer: Self-pay | Admitting: Family Medicine

## 2022-03-06 DIAGNOSIS — E039 Hypothyroidism, unspecified: Secondary | ICD-10-CM

## 2022-03-09 ENCOUNTER — Other Ambulatory Visit: Payer: Self-pay | Admitting: Internal Medicine

## 2022-06-09 ENCOUNTER — Other Ambulatory Visit: Payer: Self-pay | Admitting: Internal Medicine

## 2022-09-04 ENCOUNTER — Other Ambulatory Visit: Payer: Self-pay | Admitting: Internal Medicine

## 2022-09-17 ENCOUNTER — Other Ambulatory Visit: Payer: Self-pay | Admitting: Family Medicine

## 2022-09-17 ENCOUNTER — Ambulatory Visit
Admission: RE | Admit: 2022-09-17 | Discharge: 2022-09-17 | Disposition: A | Discharge status: Home / Self Care | Payer: Medicare Other | Source: Ambulatory Visit | Attending: Family Medicine | Admitting: Family Medicine

## 2022-09-17 DIAGNOSIS — R110 Nausea: Secondary | ICD-10-CM | POA: Diagnosis present

## 2022-09-17 DIAGNOSIS — R1031 Right lower quadrant pain: Secondary | ICD-10-CM | POA: Insufficient documentation

## 2022-09-17 DIAGNOSIS — R197 Diarrhea, unspecified: Secondary | ICD-10-CM | POA: Insufficient documentation

## 2022-09-17 LAB — POCT I-STAT CREATININE: Creatinine, Ser: 1.4 mg/dL — ABNORMAL HIGH (ref 0.44–1.00)

## 2022-09-17 MED ORDER — IOHEXOL 300 MG/ML  SOLN
80.0000 mL | Freq: Once | INTRAMUSCULAR | Status: AC | PRN
  Administered 2022-09-17: 80 mL via INTRAVENOUS

## 2022-11-12 ENCOUNTER — Other Ambulatory Visit: Payer: Self-pay | Admitting: Internal Medicine

## 2022-12-28 ENCOUNTER — Other Ambulatory Visit: Payer: Self-pay | Admitting: Neurology

## 2023-02-11 ENCOUNTER — Other Ambulatory Visit: Payer: Self-pay | Admitting: Internal Medicine

## 2023-06-18 ENCOUNTER — Other Ambulatory Visit: Payer: Self-pay | Admitting: Internal Medicine

## 2023-07-20 ENCOUNTER — Other Ambulatory Visit: Payer: Self-pay | Admitting: Family Medicine

## 2023-07-20 ENCOUNTER — Ambulatory Visit
Admission: RE | Admit: 2023-07-20 | Discharge: 2023-07-20 | Disposition: A | Discharge status: Home / Self Care | Payer: Medicare Other | Source: Ambulatory Visit | Attending: Family Medicine | Admitting: Family Medicine

## 2023-07-20 DIAGNOSIS — R053 Chronic cough: Secondary | ICD-10-CM | POA: Diagnosis present

## 2023-07-20 DIAGNOSIS — R0902 Hypoxemia: Secondary | ICD-10-CM | POA: Diagnosis present

## 2023-07-20 LAB — POCT I-STAT CREATININE: Creatinine, Ser: 1.6 mg/dL — ABNORMAL HIGH (ref 0.44–1.00)

## 2023-07-20 MED ORDER — IOHEXOL 300 MG/ML  SOLN
50.0000 mL | Freq: Once | INTRAMUSCULAR | Status: AC | PRN
  Administered 2023-07-20: 50 mL via INTRAVENOUS

## 2023-07-20 MED ORDER — IOHEXOL 300 MG/ML  SOLN: 75.0000 mL | Freq: Once | INTRAMUSCULAR | Status: DC | PRN

## 2023-08-05 ENCOUNTER — Other Ambulatory Visit: Payer: Self-pay | Admitting: Internal Medicine

## 2023-08-05 ENCOUNTER — Ambulatory Visit: Payer: Medicare Other | Admitting: Surgery

## 2023-08-10 ENCOUNTER — Telehealth: Payer: Self-pay | Admitting: Internal Medicine

## 2023-08-10 MED ORDER — OMEPRAZOLE 40 MG PO CPDR: 40.0000 mg | DELAYED_RELEASE_CAPSULE | Freq: Every day | ORAL | 0 refills | Status: DC

## 2023-08-10 NOTE — Telephone Encounter (Signed)
Omeprazole refilled and she has January 2025 appointment.

## 2023-08-10 NOTE — Telephone Encounter (Signed)
Inbound call from patient, requesting refill for omeprazole 40 MG to [facility] on [address]. Patient was scheduled with Dr. Leone Payor for January 22 at 8:30.

## 2023-09-01 ENCOUNTER — Encounter: Payer: Self-pay | Admitting: Certified Registered"

## 2023-09-01 ENCOUNTER — Other Ambulatory Visit: Payer: Self-pay

## 2023-09-01 ENCOUNTER — Encounter: Payer: Self-pay | Admitting: Internal Medicine

## 2023-09-01 ENCOUNTER — Ambulatory Visit
Admission: RE | Admit: 2023-09-01 | Discharge: 2023-09-01 | Disposition: A | Discharge status: Home / Self Care | Payer: Medicare Other | Attending: Internal Medicine | Admitting: Internal Medicine

## 2023-09-01 ENCOUNTER — Encounter
Admission: RE | Disposition: A | Discharge status: Home / Self Care | Payer: Self-pay | Source: Home / Self Care | Attending: Internal Medicine

## 2023-09-01 DIAGNOSIS — R0602 Shortness of breath: Secondary | ICD-10-CM | POA: Diagnosis not present

## 2023-09-01 DIAGNOSIS — R9430 Abnormal result of cardiovascular function study, unspecified: Secondary | ICD-10-CM | POA: Diagnosis present

## 2023-09-01 DIAGNOSIS — I2510 Atherosclerotic heart disease of native coronary artery without angina pectoris: Secondary | ICD-10-CM | POA: Diagnosis not present

## 2023-09-01 DIAGNOSIS — I2584 Coronary atherosclerosis due to calcified coronary lesion: Secondary | ICD-10-CM | POA: Diagnosis not present

## 2023-09-01 HISTORY — PX: LEFT HEART CATH AND CORONARY ANGIOGRAPHY: CATH118249

## 2023-09-01 LAB — CARDIAC CATHETERIZATION: Cath EF Quantitative: 60 %

## 2023-09-01 SURGERY — LEFT HEART CATH AND CORONARY ANGIOGRAPHY
Anesthesia: Moderate Sedation | Laterality: Left

## 2023-09-01 MED ORDER — LABETALOL HCL 5 MG/ML IV SOLN: 10.0000 mg | INTRAVENOUS | Status: DC | PRN

## 2023-09-01 MED ORDER — ACETAMINOPHEN 325 MG PO TABS: 650.0000 mg | ORAL_TABLET | ORAL | Status: DC | PRN

## 2023-09-01 MED ORDER — MIDAZOLAM HCL 2 MG/2ML IJ SOLN
INTRAMUSCULAR | Status: AC
  Filled 2023-09-01: qty 2

## 2023-09-01 MED ORDER — VERAPAMIL HCL 2.5 MG/ML IV SOLN
INTRAVENOUS | Status: AC
  Filled 2023-09-01: qty 2

## 2023-09-01 MED ORDER — VERAPAMIL HCL 2.5 MG/ML IV SOLN
INTRAVENOUS | Status: DC | PRN
  Administered 2023-09-01: 2.5 mg via INTRA_ARTERIAL

## 2023-09-01 MED ORDER — SODIUM CHLORIDE 0.9 % WEIGHT BASED INFUSION: 1.0000 mL/kg/h | INTRAVENOUS | Status: DC

## 2023-09-01 MED ORDER — HEPARIN SODIUM (PORCINE) 1000 UNIT/ML IJ SOLN
INTRAMUSCULAR | Status: AC
  Filled 2023-09-01: qty 10

## 2023-09-01 MED ORDER — SODIUM CHLORIDE 0.9 % IV SOLN: 250.0000 mL | INTRAVENOUS | Status: DC | PRN

## 2023-09-01 MED ORDER — ASPIRIN 81 MG PO CHEW
81.0000 mg | CHEWABLE_TABLET | ORAL | Status: DC
Start: 2023-09-02 — End: 2023-09-01

## 2023-09-01 MED ORDER — LIDOCAINE HCL (PF) 1 % IJ SOLN
INTRAMUSCULAR | Status: DC | PRN
  Administered 2023-09-01: 2 mL

## 2023-09-01 MED ORDER — SODIUM CHLORIDE 0.9% FLUSH: 3.0000 mL | Freq: Two times a day (BID) | INTRAVENOUS | Status: DC

## 2023-09-01 MED ORDER — HEPARIN (PORCINE) IN NACL 2000-0.9 UNIT/L-% IV SOLN
INTRAVENOUS | Status: DC | PRN
  Administered 2023-09-01: 1000 mL

## 2023-09-01 MED ORDER — SODIUM CHLORIDE 0.9 % WEIGHT BASED INFUSION
3.0000 mL/kg/h | INTRAVENOUS | Status: AC
  Administered 2023-09-01: 3 mL/kg/h via INTRAVENOUS

## 2023-09-01 MED ORDER — HEPARIN SODIUM (PORCINE) 1000 UNIT/ML IJ SOLN
INTRAMUSCULAR | Status: DC | PRN
  Administered 2023-09-01: 3000 [IU] via INTRAVENOUS

## 2023-09-01 MED ORDER — ONDANSETRON HCL 4 MG/2ML IJ SOLN: 4.0000 mg | Freq: Four times a day (QID) | INTRAMUSCULAR | Status: DC | PRN

## 2023-09-01 MED ORDER — MIDAZOLAM HCL 2 MG/2ML IJ SOLN
INTRAMUSCULAR | Status: DC | PRN
  Administered 2023-09-01: 1 mg via INTRAVENOUS

## 2023-09-01 MED ORDER — LIDOCAINE HCL 1 % IJ SOLN
INTRAMUSCULAR | Status: AC
  Filled 2023-09-01: qty 20

## 2023-09-01 MED ORDER — HEPARIN (PORCINE) IN NACL 1000-0.9 UT/500ML-% IV SOLN
INTRAVENOUS | Status: AC
  Filled 2023-09-01: qty 1000

## 2023-09-01 MED ORDER — FENTANYL CITRATE (PF) 100 MCG/2ML IJ SOLN
INTRAMUSCULAR | Status: AC
  Filled 2023-09-01: qty 2

## 2023-09-01 MED ORDER — IOHEXOL 300 MG/ML  SOLN
INTRAMUSCULAR | Status: DC | PRN
  Administered 2023-09-01: 66 mL

## 2023-09-01 MED ORDER — SODIUM CHLORIDE 0.9% FLUSH: 3.0000 mL | INTRAVENOUS | Status: DC | PRN

## 2023-09-01 MED ORDER — FENTANYL CITRATE (PF) 100 MCG/2ML IJ SOLN
INTRAMUSCULAR | Status: DC | PRN
  Administered 2023-09-01: 25 ug via INTRAVENOUS

## 2023-09-01 SURGICAL SUPPLY — 11 items
CATH 5FR JL3.5 JR4 ANG PIG MP (CATHETERS) IMPLANT
DEVICE RAD TR BAND REGULAR (VASCULAR PRODUCTS) IMPLANT
DRAPE BRACHIAL (DRAPES) IMPLANT
GLIDESHEATH SLEND SS 6F .021 (SHEATH) IMPLANT
GUIDEWIRE INQWIRE 1.5J.035X260 (WIRE) IMPLANT
INQWIRE 1.5J .035X260CM (WIRE) ×1
PACK CARDIAC CATH (CUSTOM PROCEDURE TRAY) ×1 IMPLANT
PROTECTION STATION PRESSURIZED (MISCELLANEOUS) ×1
SET ATX-X65L (MISCELLANEOUS) IMPLANT
STATION PROTECTION PRESSURIZED (MISCELLANEOUS) IMPLANT
WIRE HITORQ VERSACORE ST 145CM (WIRE) IMPLANT

## 2023-10-27 ENCOUNTER — Telehealth: Payer: Self-pay | Admitting: Internal Medicine

## 2023-10-27 NOTE — Telephone Encounter (Signed)
Patient called wanting a refill on Omerprazole, she is rescheduled for 12/15/2023 at 8:50 AM due to weather conditions.

## 2023-10-28 ENCOUNTER — Ambulatory Visit: Payer: Medicare Other | Admitting: Internal Medicine

## 2023-10-28 NOTE — Telephone Encounter (Signed)
Please see note below and advise  

## 2023-10-28 NOTE — Telephone Encounter (Signed)
Okay to give her a refill until she can be seen

## 2023-10-29 ENCOUNTER — Other Ambulatory Visit: Payer: Self-pay

## 2023-10-29 MED ORDER — OMEPRAZOLE 40 MG PO CPDR: 40.0000 mg | DELAYED_RELEASE_CAPSULE | Freq: Every day | ORAL | 0 refills | Status: DC

## 2023-10-29 NOTE — Telephone Encounter (Signed)
Refill sent to patient's pharmacy. Unable to reach, line rings then busy signal. Message sent to pt on mychart as well.

## 2023-10-30 NOTE — Telephone Encounter (Signed)
Unable to reach, line rings then busy signal.

## 2023-11-02 NOTE — Telephone Encounter (Signed)
Pt made aware that prescription was sent to pharmacy.  Pt made aware of upcoming office visit. Pt verbalized understanding with all questions answered.

## 2023-12-01 ENCOUNTER — Emergency Department: Payer: Medicare Other

## 2023-12-01 ENCOUNTER — Other Ambulatory Visit: Payer: Self-pay

## 2023-12-01 ENCOUNTER — Emergency Department
Admission: EM | Admit: 2023-12-01 | Discharge: 2023-12-01 | Disposition: A | Discharge status: Home / Self Care | Payer: Medicare Other | Attending: Student in an Organized Health Care Education/Training Program | Admitting: Student in an Organized Health Care Education/Training Program

## 2023-12-01 DIAGNOSIS — Z20822 Contact with and (suspected) exposure to covid-19: Secondary | ICD-10-CM | POA: Diagnosis not present

## 2023-12-01 DIAGNOSIS — E860 Dehydration: Secondary | ICD-10-CM | POA: Diagnosis not present

## 2023-12-01 DIAGNOSIS — R0602 Shortness of breath: Secondary | ICD-10-CM | POA: Diagnosis not present

## 2023-12-01 DIAGNOSIS — R42 Dizziness and giddiness: Secondary | ICD-10-CM | POA: Diagnosis present

## 2023-12-01 LAB — RESP PANEL BY RT-PCR (RSV, FLU A&B, COVID)  RVPGX2
Influenza A by PCR: NEGATIVE
Influenza B by PCR: NEGATIVE
Resp Syncytial Virus by PCR: NEGATIVE
SARS Coronavirus 2 by RT PCR: NEGATIVE

## 2023-12-01 LAB — URINALYSIS, ROUTINE W REFLEX MICROSCOPIC
Bilirubin Urine: NEGATIVE
Glucose, UA: NEGATIVE mg/dL
Hgb urine dipstick: NEGATIVE
Ketones, ur: NEGATIVE mg/dL
Leukocytes,Ua: NEGATIVE
Nitrite: NEGATIVE
Protein, ur: NEGATIVE mg/dL
Specific Gravity, Urine: 1.008 (ref 1.005–1.030)
pH: 5 (ref 5.0–8.0)

## 2023-12-01 LAB — CBC
HCT: 34.9 % — ABNORMAL LOW (ref 36.0–46.0)
Hemoglobin: 11.6 g/dL — ABNORMAL LOW (ref 12.0–15.0)
MCH: 33.6 pg (ref 26.0–34.0)
MCHC: 33.2 g/dL (ref 30.0–36.0)
MCV: 101.2 fL — ABNORMAL HIGH (ref 80.0–100.0)
Platelets: 213 10*3/uL (ref 150–400)
RBC: 3.45 MIL/uL — ABNORMAL LOW (ref 3.87–5.11)
RDW: 13.2 % (ref 11.5–15.5)
WBC: 7 10*3/uL (ref 4.0–10.5)
nRBC: 0 % (ref 0.0–0.2)

## 2023-12-01 LAB — COMPREHENSIVE METABOLIC PANEL
ALT: 27 U/L (ref 0–44)
AST: 26 U/L (ref 15–41)
Albumin: 3.7 g/dL (ref 3.5–5.0)
Alkaline Phosphatase: 60 U/L (ref 38–126)
Anion gap: 8 (ref 5–15)
BUN: 26 mg/dL — ABNORMAL HIGH (ref 8–23)
CO2: 23 mmol/L (ref 22–32)
Calcium: 9 mg/dL (ref 8.9–10.3)
Chloride: 108 mmol/L (ref 98–111)
Creatinine, Ser: 1.03 mg/dL — ABNORMAL HIGH (ref 0.44–1.00)
GFR, Estimated: 55 mL/min — ABNORMAL LOW (ref >60)
Glucose, Bld: 113 mg/dL — ABNORMAL HIGH (ref 70–99)
Potassium: 4.4 mmol/L (ref 3.5–5.1)
Sodium: 139 mmol/L (ref 135–145)
Total Bilirubin: 0.5 mg/dL (ref 0.0–1.2)
Total Protein: 6.4 g/dL — ABNORMAL LOW (ref 6.5–8.1)

## 2023-12-01 LAB — TROPONIN I (HIGH SENSITIVITY): Troponin I (High Sensitivity): 7 ng/L (ref <18)

## 2023-12-01 MED ORDER — SODIUM CHLORIDE 0.9 % IV BOLUS
1000.0000 mL | Freq: Once | INTRAVENOUS | Status: AC
  Administered 2023-12-01: 1000 mL via INTRAVENOUS

## 2023-12-01 NOTE — ED Triage Notes (Signed)
 Pt states that she started feeling bad last pm, states that she was weak and dizzy, reports that she feels ok sitting but when she gets up she feels like she is going to pass out, states that she has tried to rehydrate today, denies vomiting but reports having diarrhea for the past week, states that she was supposed to have a cardiac stent placed in January but didn't because she was having lung problems at that time, states that she took immodium this am and reports that she has also had a yeast infection since the 10th of January

## 2023-12-01 NOTE — ED Notes (Signed)
 Called lab to add on troponin

## 2023-12-01 NOTE — ED Provider Notes (Signed)
 [facility] Provider Note    Event Date/Time   First MD Initiated Contact with Patient 12/01/23 1318     (approximate)   History   Weakness and Dizziness   HPI  Carla Lyons is a 83 y.o. female who presents to the ER for evaluation of dizziness and lightheadedness that occurs particularly with standing and ambulation.  This been going on for past few days.  Denies any numbness or tingling no headache.  No recent falls.  Has had some shortness of breath but states that this feels chronic for her.  Denies any lower extremity swelling.  States that she just feels like she is gotten dehydrated.  No nausea or vomiting or abdominal pain.     Physical Exam   Triage Vital Signs: ED Triage Vitals  Encounter Vitals Group     BP 12/01/23 1141 111/79     Systolic BP Percentile --      Diastolic BP Percentile --      Pulse Rate 12/01/23 1141 81     Resp 12/01/23 1141 16     Temp 12/01/23 1141 98.7 F (37.1 C)     Temp Source 12/01/23 1141 Oral     SpO2 12/01/23 1141 96 %     Weight 12/01/23 1144 143 lb (64.9 kg)     Height 12/01/23 1144 5\' 3"  (1.6 m)     Head Circumference --      Peak Flow --      Pain Score 12/01/23 1144 0     Pain Loc --      Pain Education --      Exclude from Growth Chart --     Most recent vital signs: Vitals:   12/01/23 1430 12/01/23 1500  BP: 138/66 (!) 147/59  Pulse: (!) 52 (!) 57  Resp: 11 16  Temp:    SpO2: 97% 96%     Constitutional: Alert  Eyes: Conjunctivae are normal.  Head: Atraumatic. Nose: No congestion/rhinnorhea. Mouth/Throat: Mucous membranes are moist.   Neck: Painless ROM.  Cardiovascular:   Good peripheral circulation. Respiratory: Normal respiratory effort.  No retractions.  Gastrointestinal: Soft and nontender.  Musculoskeletal:  no deformity Neurologic:  MAE spontaneously. No gross focal neurologic deficits are appreciated.  Skin:  Skin is warm, dry and intact. No rash noted. Psychiatric: Mood and  affect are normal. Speech and behavior are normal.    ED Results / Procedures / Treatments   Labs (all labs ordered are listed, but only abnormal results are displayed) Labs Reviewed  CBC - Abnormal; Notable for the following components:      Result Value   RBC 3.45 (*)    Hemoglobin 11.6 (*)    HCT 34.9 (*)    MCV 101.2 (*)    All other components within normal limits  URINALYSIS, ROUTINE W REFLEX MICROSCOPIC - Abnormal; Notable for the following components:   Color, Urine STRAW (*)    APPearance CLEAR (*)    All other components within normal limits  COMPREHENSIVE METABOLIC PANEL - Abnormal; Notable for the following components:   Glucose, Bld 113 (*)    BUN 26 (*)    Creatinine, Ser 1.03 (*)    Total Protein 6.4 (*)    GFR, Estimated 55 (*)    All other components within normal limits  RESP PANEL BY RT-PCR (RSV, FLU A&B, COVID)  RVPGX2  CBG MONITORING, ED  TROPONIN I (HIGH SENSITIVITY)     EKG  ED ECG REPORT  I, Willy Eddy, the attending physician, personally viewed and interpreted this ECG.   Date: 12/01/2023  EKG Time: 11:52  Rate: 70  Rhythm: sinus  Axis: normal  Intervals: normal  ST&T Change: no stemi, no depressions    RADIOLOGY Please see ED Course for my review and interpretation.  I personally reviewed all radiographic images ordered to evaluate for the above acute complaints and reviewed radiology reports and findings.  These findings were personally discussed with the patient.  Please see medical record for radiology report.    PROCEDURES:  Critical Care performed: no  Procedures   MEDICATIONS ORDERED IN ED: Medications  sodium chloride 0.9 % bolus 1,000 mL (1,000 mLs Intravenous New Bag/Given 12/01/23 1406)     IMPRESSION / MDM / ASSESSMENT AND PLAN / ED COURSE  I reviewed the triage vital signs and the nursing notes.                              Differential diagnosis includes, but is not limited to, dehydration, electrolyte  abnormality, orthostasis, UTI, pneumonia, CHF  Patient presenting to the ER for evaluation of symptoms as described above.  Based on symptoms, risk factors and considered above differential, this presenting complaint could reflect a potentially life-threatening illness therefore the patient will be placed on continuous pulse oximetry and telemetry for monitoring.  Laboratory evaluation will be sent to evaluate for the above complaints.      Clinical Course as of 12/01/23 1516  Tue Dec 01, 2023  1515 Patient reassessed.  Feels significantly proved after IV fluids.  At this point she does appear stable and appropriate for outpatient follow-up. [PR]    Clinical Course User Index [PR] Willy Eddy, MD     FINAL CLINICAL IMPRESSION(S) / ED DIAGNOSES   Final diagnoses:  Dehydration     Rx / DC Orders   ED Discharge Orders     None        Note:  This document was prepared using Dragon voice recognition software and may include unintentional dictation errors.    Willy Eddy, MD 12/01/23 [phone]

## 2023-12-15 ENCOUNTER — Encounter: Payer: Self-pay | Admitting: Internal Medicine

## 2023-12-15 ENCOUNTER — Ambulatory Visit: Payer: Medicare Other | Admitting: Internal Medicine

## 2023-12-15 VITALS — BP 118/66 | HR 66 | Ht 63.0 in | Wt 148.5 lb

## 2023-12-15 DIAGNOSIS — K219 Gastro-esophageal reflux disease without esophagitis: Secondary | ICD-10-CM | POA: Diagnosis not present

## 2023-12-15 DIAGNOSIS — K449 Diaphragmatic hernia without obstruction or gangrene: Secondary | ICD-10-CM | POA: Diagnosis not present

## 2023-12-15 DIAGNOSIS — R0600 Dyspnea, unspecified: Secondary | ICD-10-CM | POA: Diagnosis not present

## 2023-12-15 DIAGNOSIS — Z87891 Personal history of nicotine dependence: Secondary | ICD-10-CM

## 2023-12-15 DIAGNOSIS — J439 Emphysema, unspecified: Secondary | ICD-10-CM

## 2023-12-15 DIAGNOSIS — K257 Chronic gastric ulcer without hemorrhage or perforation: Secondary | ICD-10-CM | POA: Diagnosis not present

## 2023-12-15 MED ORDER — OMEPRAZOLE 40 MG PO CPDR: 40.0000 mg | DELAYED_RELEASE_CAPSULE | Freq: Every day | ORAL | 3 refills | Status: AC

## 2023-12-15 NOTE — Patient Instructions (Signed)
 We have sent the following medications to your pharmacy for you to pick up at your convenience: Omeprazole  We have placed a referral to [facility] and they will contact you about setting up an appointment.    _______________________________________________________  If your blood pressure at your visit was 140/90 or greater, please contact your primary care physician to follow up on this.  _______________________________________________________  If you are age 82 or older, your body mass index should be between 23-30. Your Body mass index is 26.31 kg/m. If this is out of the aforementioned range listed, please consider follow up with your Primary Care Provider.  If you are age 80 or younger, your body mass index should be between 19-25. Your Body mass index is 26.31 kg/m. If this is out of the aformentioned range listed, please consider follow up with your Primary Care Provider.   ________________________________________________________  The [facility] providers would like to encourage you to use [facility] to communicate with providers for non-urgent requests or questions.  Due to long hold times on the telephone, sending your provider a message by [facility] may be a faster and more efficient way to get a response.  Please allow 48 business hours for a response.  Please remember that this is for non-urgent requests.  _______________________________________________________  I appreciate the opportunity to care for you. Stan Head, MD, [facility]

## 2023-12-15 NOTE — Progress Notes (Signed)
 Carla Lyons 83 y.o. 06/01/1941 [id number]  Assessment & Plan:   Encounter Diagnoses  Name Primary?   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  I told her I am not sure though it is possible it could contribute.  Will obtain a second opinion pulmonary consult with [facility].  She does have changes of emphysema and diffuse chronic interstitial coarsening on a regular chest CT in October 2024.  There is a significant family history of pulmonary fibrosis.  I am a little puzzled that she has not had pulmonary function test performed given her complaints.  Years ago I had dissuaded her from surgery when she was just having iron deficiency anemia related to [facility] ulcers, and she remembers that clearly and is opposed to any type of surgery for the hernia.  I did explain to her today that it could be indicated and it was not absolutely contraindicated. In the meantime I have refilled her PPI.  Perhaps she could get this through primary care going forward.  CC: [name], PA-C   Subjective:   Chief Complaint: GERD, hiatal hernia refill omeprazole  HPI 83 year old white woman not seen since 2021 when we reviewed her chronic Cameron ulcers and large hiatal hernia, presents with her daughter today ostensibly for an omeprazole refill.  The patient has been experiencing shortness of breath, and has had a pulmonary and cardiac evaluation in [place].  She saw Dr. Karna Christmas in October 2024r for pulmonary consultation and the patient reports he recommended she have surgery for her hiatal hernia though the note does not reflect that.  She is adamant that that was what was stated.  Since I had recommended against hiatal hernia  repair in 2021 given her age and potential complications she is very opposed to having hernia repair.  There is a strong family history of pulmonary fibrosis which concerns her.  CT scanning of the chest demonstrated a "background of emphysema and chronic interstitial coarsening".  Moderate hiatal hernia described.  This is 07/20/2023.  December 2023 CT abdomen pelvis with large hiatal hernia.  She had a cardiac evaluation and was last seen there in February, she does have coronary artery disease, and she and the doctor elected not to stent a 70% proximal LAD lesion.  She notes that she has improved on Trelegy inhaler.  Note that Plavix is because of prior history of TIA.  Cardiology has prescribed amlodipine Ranexa and high-dose statin.  She denies heartburn dysphagia.  There is no reports of bleeding.  She stays on chronic iron therapy and hemoglobin was 12 in December.  She has chronic kidney disease.  Wt Readings from Last 3 Encounters:  12/15/23 148 lb 8 oz (67.4 kg)  12/01/23 143 lb (64.9 kg)  09/01/23 147 lb 11.2 oz (67 kg)   EGD 12/06/2019-large hiatal hernia with multiple Cameron ulcers and erosive gastropathy which biopsy showed mild inflammation no H. Pylori  Colonoscopy 11/17/2018 small AVM ascending colon and diverticulosis. Allergies  Allergen Reactions   Fosamax [Alendronate Sodium]     Red and hot all over   Niacin And Related     Rash and red all over   Codeine Phosphate Nausea And Vomiting   Current  Meds  Medication Sig   acetaminophen (TYLENOL) 500 MG tablet Take 500-1,000 mg by mouth every 6 (six) hours as needed for moderate pain (pain score 4-6) or headache.   allopurinol (ZYLOPRIM) 100 MG tablet Take 100 mg by mouth daily.   ALPRAZolam (XANAX) 0.25 MG tablet Take 1 tablet (0.25 mg total) by mouth at bedtime as needed for anxiety. (Patient taking differently: Take 0.25 mg by mouth daily as needed for anxiety.)   amLODipine (NORVASC) 5 MG tablet TAKE 1 TABLET BY MOUTH EVERY  DAY   atorvastatin (LIPITOR) 80 MG tablet Take 1 tablet (80 mg total) by mouth daily. NEED APPOINTMENT   b complex vitamins tablet Take 1 tablet by mouth daily.   Biotin 5 MG TABS Take 5 mg by mouth daily.   calcium carbonate (OSCAL) 1500 (600 Ca) MG TABS tablet Take 600 mg of elemental calcium by mouth daily.   cholecalciferol (VITAMIN D) 1000 UNITS tablet Take 1,000 Units by mouth daily.   clopidogrel (PLAVIX) 75 MG tablet Take 1 tablet (75 mg total) by mouth every Monday, Wednesday, and Friday. (Patient taking differently: Take 75 mg by mouth every morning.)   Cyanocobalamin 1000 MCG SUBL Place 1,000 mcg under the tongue daily.   ferrous sulfate 325 (65 FE) MG tablet Take 325 mg by mouth at bedtime.   furosemide (LASIX) 20 MG tablet Take 20 mg by mouth daily as needed for edema.   levothyroxine (SYNTHROID) 88 MCG tablet TAKE 1 TABLET (88 MCG TOTAL) BY MOUTH DAILY BEFORE BREAKFAST. (Patient taking differently: Take 100 mcg by mouth daily before breakfast.)   tobramycin-dexamethasone (TOBRADEX) ophthalmic ointment Place 1 application into the right eye 2 (two) times daily as needed (contact dermatitis).   TRELEGY ELLIPTA 100-62.5-25 MCG/ACT AEPB Inhale 1 puff into the lungs daily.   [DISCONTINUED] omeprazole (PRILOSEC) 40 MG capsule Take 1 capsule (40 mg total) by mouth daily before breakfast.   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   Colon polyps 06/15/2007   COPD (chronic obstructive pulmonary disease) (HCC)    Diverticulosis    hx of   Dysrhythmia    SVT   GERD (gastroesophageal reflux disease)    Hyperlipidemia    Hypertension    Hypothyroidism    Iron deficiency anemia due to chronic blood loss 12/06/2019   Large hiatal hernia 12/06/2019   Osteoporosis    Personal  history of colonic adenomas 07/19/2007   Renal insufficiency    Stroke [facility])    Thyroid disease    TIA (transient ischemic attack)    Trigeminal neuralgia 01/04/2015   Past Surgical History:  Procedure Laterality Date   APPENDECTOMY     CATARACT EXTRACTION W/ INTRAOCULAR LENS IMPLANT Right 01/08/2018   CATARACT EXTRACTION W/ INTRAOCULAR LENS IMPLANT Left 02/05/2018   CESAREAN SECTION     x 2   COLONOSCOPY  11/17/2018   ESOPHAGOGASTRODUODENOSCOPY ENDOSCOPY     LEFT HEART CATH AND CORONARY ANGIOGRAPHY Left 09/01/2023   Procedure: LEFT HEART CATH AND CORONARY ANGIOGRAPHY;  Surgeon: Alwyn Pea, MD;  Location: [facility] INVASIVE CV LAB;  Service: Cardiovascular;  Laterality: Left;   PARTIAL HYSTERECTOMY  age 15   SHOULDER ARTHROSCOPY WITH DEBRIDEMENT AND BICEP TENDON REPAIR Left 12/19/2021   Procedure: Left shoulder  arthroscopy with debridment, decompression, rotator cuff repair and biceps tenodesis;  Surgeon: Christena Flake, MD;  Location: [facility] ORS;  Service: Orthopedics;  Laterality: Left;   VAGINAL HYSTERECTOMY  age 73   partial   Social History   Social History Narrative   Does have a living will.   Daughter is HPOA- does not want any extra ordinary measures.  Would want CPR.      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   Review of Systems As per HPI also some pedal edema.  Otherwise negative.  Objective:   Physical Exam @BP  118/66    Pulse 66   Ht 5\' 3"  (1.6 m)   Wt 148 lb 8 oz (67.4 kg)   BMI 26.31 kg/m @  General:  NAD Eyes:   anicteric Lungs:  clear Heart::  S1S2 no rubs, murmurs or gallops Abdomen:  soft and nontender, BS+ Ext:   no edema, cyanosis or clubbing    Data Reviewed:  See HPI

## 2024-03-03 NOTE — Progress Notes (Unsigned)
 OV 03/04/2024  Subjective:  Patient ID: Carla Lyons, female , DOB: 08/01/1941 , age 83 y.o. , MRN: [id number] , ADDRESS: [id number] [place] [address] PCP Gwendalyn Lemma, Claudette Cue, PA-C Patient Care Team: Lenell Query, PA-C as PCP - General (Family Medicine) End, Veryl Gottron, MD as PCP - Cardiology (Cardiology)  This Provider for this visit: Treatment Team:  Attending Provider: Maire Scot, MD    03/04/2024 -   Chief Complaint  Patient presents with   Consult    Emphysema, SOB on exertion, no wheezing or coughing     HPI Carla Lyons 74 y.o. -is a sister of my [place] patient Carla Lyons who passed away in 28-Mar-2021 from IPF.  Haskell Linker was amazing supporter of pulmonary fibrosis foundation and support group and was one of the founding members.  She lived long with the IPF diagnosis.  Patient is here with her daughter Carla Lyons.  [name] lives in [place]  but patient lives in [place]..  She specifically chose to see me because of my association taking care of her sister.  She says that she has neuropathy and imbalance.  She has been using a cane for a few years.  Also intermittent short-term memory loss.  She does have moderate hiatal hernia [as described on the CT chest] although described as large by Dr. Willy Harvest in office visit March 2025.  There is a history of Cameron ulcers with this.  She is here because of concern of pulmonary fibrosis because of the family history but also because she has shortness of breath on exertion.  And the other risk factor being the hiatal hernia. She tells me that she has shortness of breath for at least a year.  It is improved with the oxygen which she has.  It is worsened by exertion but also occasionally by eating when she swallows.  She has some inhalers with her because of a diagnosis of emphysema based on CT chest but this is not helping her..  In October 2024 she she saw Dr Jaclynn Mast at [facility] clinic for concern of  rule out ILD and because of the shortness of breath.  She she had a concern about family history of pulmonary fibrosis and her own concern for pulmonary fibrosis.  But she says she was asked to undergo hernia repair.  Therefore she saw Dr. Willy Harvest earlier this year.  Years ago surgery was not recommended.  She is petrified of having Hartle hernia surgery.  The CT scan of the chest that I have for my visualization is 1 with contrast in October 2024.  It is hard to tell if there is ILD or not although there might be some interstitial thickening.   Review of the medical records indicate - Admission in May 2021 for TIA complicated by migraine - History of CKD stage IIIb at that time - Arthroscopic repair of the left shoulder in March 2023: - Admission February 2023 to the ER -Cardiology visit in February 2025 to Dr. Bob Burn at the [facility] which reports stress test October 2024 shows mild anterior apical defect.  Left heart catheterization showed proximal LAD 70% but otherwise moderate disease.  Subjected to medical management.  PCI was avoided.  - multiple at least 3 family members were on supplemental O2 due to hypoxemia. Her sister passed away at 69. Brother passed at 39. Another sister had IPF for 8 years after diagnosis and passed away at age 55.    CT Chest data  from date:   - personally visualized and independently interpreted : yes - my findings are: as below Narrative & Impression  CLINICAL DATA:  Bronchitis and shortness of breath.  Cough.   EXAM: CT CHEST WITH CONTRAST   TECHNIQUE: Multidetector CT imaging of the chest was performed during intravenous contrast administration.   RADIATION DOSE REDUCTION: This exam was performed according to the departmental dose-optimization program which includes automated exposure control, adjustment of the mA and/or kV according to patient size and/or use of iterative reconstruction technique.   CONTRAST:  50mL OMNIPAQUE  IOHEXOL  300 MG/ML   SOLN   COMPARISON:  Chest CT dated 04/18/2020.   FINDINGS: Cardiovascular: There is no cardiomegaly or pericardial effusion. Advanced 3 vessel coronary vascular calcification. There is moderate atherosclerotic calcification of the thoracic aorta. No aneurysmal dilatation or dissection. The origins of the great vessels of the aortic arch are patent. No pulmonary artery embolus identified.   Mediastinum/Nodes: No hilar or mediastinal adenopathy. There is a moderate size hiatal hernia. The esophagus is grossly unremarkable. No mediastinal fluid collection.   Lungs/Pleura: Background of emphysema and diffuse chronic interstitial coarsening. No focal consolidation, pleural effusion, or pneumothorax. The central airways are patent.   Upper Abdomen: Fatty liver.   Musculoskeletal: No acute osseous pathology.   IMPRESSION: 1. No acute intrathoracic pathology. No CT evidence of pulmonary artery embolus. 2. Moderate size hiatal hernia. 3. Fatty liver. 4. Aortic Atherosclerosis (ICD10-I70.0) and Emphysema (ICD10-J43.9).     Electronically Signed   By: Angus Bark M.D.   On: 07/20/2023 16:32   Latest Reference Range & Units 05/14/07 19:15 08/30/08 08:53 10/09/09 15:27 11/15/09 18:05 01/03/13 12:00 01/28/13 14:28 02/15/13 12:02 04/17/13 08:59 05/18/13 14:50 06/16/13 09:25 03/02/14 12:27 10/24/14 11:14 12/18/14 12:09 02/26/17 14:34 03/17/17 09:22 10/18/18 08:21 02/02/19 08:51 10/19/19 11:52 01/25/20 10:13 03/02/20 22:41 04/18/20 15:02 09/20/20 09:24  Eosinophils Absolute 0.0 - 0.5 K/uL 0.1 0.3 0.4 0.3 0.2 0.3 0.2 0.1 0.3 0.2 0.2 0.2 0.1 0.2 0.1 0.3 0.3 0.2 0.3 0.3 0.2 0.1      PFT      No data to display             LAB RESULTS last 96 hours No results found.       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   reports that she quit smoking about 22 years ago. Her smoking use included cigarettes. She started smoking about 37 years ago. She has a 3.8 pack-year smoking history. She has never been exposed to tobacco smoke. She has never used smokeless tobacco.  Past Surgical History:  Procedure Laterality Date   APPENDECTOMY     CATARACT EXTRACTION W/ INTRAOCULAR LENS IMPLANT Right 01/08/2018   CATARACT EXTRACTION W/ INTRAOCULAR LENS IMPLANT Left 02/05/2018   CESAREAN SECTION     x 2   COLONOSCOPY  11/17/2018   ESOPHAGOGASTRODUODENOSCOPY ENDOSCOPY     LEFT HEART CATH AND CORONARY ANGIOGRAPHY Left 09/01/2023   Procedure: LEFT HEART CATH AND CORONARY ANGIOGRAPHY;  Surgeon: Antonette Batters, MD;  Location: [facility] INVASIVE CV LAB;  Service: Cardiovascular;  Laterality: Left;  PARTIAL HYSTERECTOMY  age 32   SHOULDER ARTHROSCOPY WITH DEBRIDEMENT AND BICEP TENDON REPAIR Left 12/19/2021   Procedure: Left shoulder arthroscopy with debridment, decompression, rotator cuff repair and biceps tenodesis;  Surgeon: Elner Hahn, MD;  Location: [facility] ORS;  Service: Orthopedics;  Laterality: Left;   VAGINAL HYSTERECTOMY  age 78   partial    Allergies  Allergen Reactions   Fosamax [Alendronate Sodium]     Red and hot all over   Niacin And Related     Rash and red all over   Codeine  Phosphate Nausea And Vomiting    Immunization History  Administered Date(s) Administered   Influenza Split 08/05/2011   Influenza Whole 07/17/2004, 07/10/2009, 07/02/2010    Influenza, High Dose Seasonal PF 07/27/2018, 05/20/2019   Influenza,inj,Quad PF,6+ Mos 06/16/2013   Influenza-Unspecified 06/19/2014, 05/25/2015, 06/11/2017   PFIZER(Purple Top)SARS-COV-2 Vaccination 10/26/2019, 11/16/2019, 10/19/2020   Pneumococcal Conjugate-13 03/09/2014   Pneumococcal Polysaccharide-23 07/17/2004, 02/26/2017   Td 06/20/2006   Tdap 09/06/2020   Zoster, Live 03/09/2014    Family History  Problem Relation Age of Onset   Coronary artery disease Mother    Other Mother        bypass surgery   Dementia Mother    Osteoarthritis Father    Heart disease Father    Heart attack Maternal Grandfather    Lung disease Brother    Hypertension Brother    Lung disease Sister    Lung disease Sister    Colon cancer Neg Hx    Esophageal cancer Neg Hx    Rectal cancer Neg Hx    Stomach cancer Neg Hx    Colon polyps Neg Hx      Current Outpatient Medications:    acetaminophen  (TYLENOL ) 500 MG tablet, Take 500-1,000 mg by mouth every 6 (six) hours as needed for moderate pain (pain score 4-6) or headache., Disp: , Rfl:    allopurinol (ZYLOPRIM) 100 MG tablet, Take 100 mg by mouth daily., Disp: , Rfl:    ALPRAZolam  (XANAX ) 0.25 MG tablet, Take 1 tablet (0.25 mg total) by mouth at bedtime as needed for anxiety. (Patient taking differently: Take 0.25 mg by mouth daily as needed for anxiety.), Disp: 30 tablet, Rfl: 1   amLODipine  (NORVASC ) 5 MG tablet, TAKE 1 TABLET BY MOUTH EVERY DAY, Disp: 90 tablet, Rfl: 1   atorvastatin  (LIPITOR) 80 MG tablet, Take 1 tablet (80 mg total) by mouth daily. NEED APPOINTMENT, Disp: 30 tablet, Rfl: 0   b complex vitamins tablet, Take 1 tablet by mouth daily., Disp: , Rfl:    Biotin 5 MG TABS, Take 5 mg by mouth daily., Disp: , Rfl:    calcium  carbonate (OSCAL) 1500 (600 Ca) MG TABS tablet, Take 600 mg of elemental calcium  by mouth daily., Disp: , Rfl:    cholecalciferol (VITAMIN D ) 1000 UNITS tablet, Take 1,000 Units by mouth daily., Disp: , Rfl:     clopidogrel  (PLAVIX ) 75 MG tablet, Take 1 tablet (75 mg total) by mouth every Monday, Wednesday, and Friday. (Patient taking differently: Take 75 mg by mouth every morning.), Disp: , Rfl:    Cyanocobalamin  1000 MCG SUBL, Place 1,000 mcg under the tongue daily., Disp: , Rfl:    ferrous sulfate 325 (65 FE) MG tablet, Take 325 mg by mouth at bedtime., Disp: , Rfl:    furosemide (LASIX) 20 MG tablet, Take 20 mg by mouth daily as needed for edema., Disp: , Rfl:    levothyroxine  (SYNTHROID ) 88 MCG tablet, TAKE 1 TABLET (  88 MCG TOTAL) BY MOUTH DAILY BEFORE BREAKFAST. (Patient taking differently: Take 100 mcg by mouth daily before breakfast.), Disp: 90 tablet, Rfl: 3   omeprazole  (PRILOSEC) 40 MG capsule, Take 1 capsule (40 mg total) by mouth daily before breakfast., Disp: 90 capsule, Rfl: 3   tobramycin-dexamethasone  (TOBRADEX) ophthalmic ointment, Place 1 application into the right eye 2 (two) times daily as needed (contact dermatitis)., Disp: , Rfl:    TRELEGY ELLIPTA 100-62.5-25 MCG/ACT AEPB, Inhale 1 puff into the lungs daily., Disp: , Rfl:       Objective:   Vitals:   03/04/24 1009  BP: 137/75  Pulse: 62  SpO2: 95%  Weight: 148 lb 9.6 oz (67.4 kg)  Height: 5\' 3"  (1.6 m)    Estimated body mass index is 26.32 kg/m as calculated from the following:   Height as of this encounter: 5\' 3"  (1.6 m).   Weight as of this encounter: 148 lb 9.6 oz (67.4 kg).  @WEIGHTCHANGE @  [employer]   03/04/24 1009  Weight: 148 lb 9.6 oz (67.4 kg)     Physical Exam   General: No distress. Looks wel O2 at rest: non Cane present: YES Sitting in wheel chair: no Frail: no Obese: no Neuro: Alert and Oriented x 3. GCS 15. Speech normal Psych: Pleasant Resp:  Barrel Chest - no.  Wheeze - no, Crackles - no, No overt respiratory distress CVS: Normal heart sounds. Murmurs - no Ext: Stigmata of Connective Tissue Disease - no HEENT: Normal upper airway. PEERL +. No post nasal drip        Assessment:        ICD-10-CM   1. DOE (dyspnea on exertion)  R06.09 Pulmonary function test    CT Chest High Resolution    2. Family history of pulmonary fibrosis  Z83.6 Pulmonary function test    CT Chest High Resolution    3. Hiatal hernia with GERD  K44.9 Pulmonary function test   K21.9 CT Chest High Resolution         Plan:     Patient Instructions     ICD-10-CM   1. DOE (dyspnea on exertion)  R06.09     2. Family history of pulmonary fibrosis  Z83.6     3. Hiatal hernia with GERD  K44.9    K21.9      Unclear if you have Pulmonary Fibrosis or NOT. If you do, then is very mild  Plan  - do exercise hypoxemia test at next visit   - do full PFT   - do HRCT  - sent message to genetic counselor if we should test you (in 2021 your sisters genetic test was negative) - take ILD questions and bring it back next visit  - there is going to be a 3rd drug approved for IPF soon  Followu - < 8 weeks after PFT and CT   FOLLOWUP Return in about 7 weeks (around 04/22/2024) for 30 min visit, with Dr Bertrum Brodie, Face to Face Visit.    SIGNATURE    Dr. Maire Scot, M.D., F.C.C.P,  Pulmonary and Critical Care Medicine Staff Physician, [facility] Director - Interstitial Lung Disease  Program  Pulmonary Fibrosis [facility] at [facility] [place], [place], [address]  Pager: [phone], If no answer or between  15:00h - 7:00h: call [phone] Telephone: [phone]  4:33 PM 03/04/2024

## 2024-03-04 ENCOUNTER — Ambulatory Visit: Admitting: Internal Medicine

## 2024-03-04 ENCOUNTER — Telehealth: Payer: Self-pay | Admitting: Internal Medicine

## 2024-03-04 ENCOUNTER — Encounter: Payer: Self-pay | Admitting: Internal Medicine

## 2024-03-04 VITALS — BP 137/75 | HR 62 | Ht 63.0 in | Wt 148.6 lb

## 2024-03-04 DIAGNOSIS — K219 Gastro-esophageal reflux disease without esophagitis: Secondary | ICD-10-CM | POA: Diagnosis not present

## 2024-03-04 DIAGNOSIS — K449 Diaphragmatic hernia without obstruction or gangrene: Secondary | ICD-10-CM

## 2024-03-04 DIAGNOSIS — Z836 Family history of other diseases of the respiratory system: Secondary | ICD-10-CM

## 2024-03-04 DIAGNOSIS — R0609 Other forms of dyspnea: Secondary | ICD-10-CM

## 2024-03-04 DIAGNOSIS — Z87891 Personal history of nicotine dependence: Secondary | ICD-10-CM

## 2024-03-04 NOTE — Patient Instructions (Addendum)
 ICD-10-CM   1. DOE (dyspnea on exertion)  R06.09     2. Family history of pulmonary fibrosis  Z83.6     3. Hiatal hernia with GERD  K44.9    K21.9      Unclear if you have Pulmonary Fibrosis or NOT. If you do, then is very mild  Plan  - do exercise hypoxemia test at next visit   - do full PFT   - do HRCT  - sent message to genetic counselor if we should test you (in 2021 your sisters genetic test was negative) - take ILD questions and bring it back next visit  - there is going to be a 3rd drug approved for IPF soon  Followu - < 8 weeks after PFT and CT

## 2024-03-04 NOTE — Telephone Encounter (Signed)
   Carla Lyons  Might have ILD. Getting HRCT but her sister was . She saw you in 2021 and panel was negative. Given famil hx should Carla Lyons undergoing genetic eval with you  [name]. [name] Female, 83 y.o., 04/11/1939 Pronouns: she/her/hers MRN: [id number] Code: Prior

## 2024-03-10 ENCOUNTER — Ambulatory Visit
Admission: RE | Admit: 2024-03-10 | Discharge: 2024-03-10 | Disposition: A | Discharge status: Home / Self Care | Source: Ambulatory Visit | Attending: Internal Medicine | Admitting: Internal Medicine

## 2024-03-10 ENCOUNTER — Ambulatory Visit: Admission: RE | Admit: 2024-03-10 | Source: Ambulatory Visit

## 2024-03-10 DIAGNOSIS — K449 Diaphragmatic hernia without obstruction or gangrene: Secondary | ICD-10-CM | POA: Diagnosis present

## 2024-03-10 DIAGNOSIS — Z836 Family history of other diseases of the respiratory system: Secondary | ICD-10-CM | POA: Insufficient documentation

## 2024-03-10 DIAGNOSIS — K219 Gastro-esophageal reflux disease without esophagitis: Secondary | ICD-10-CM | POA: Insufficient documentation

## 2024-03-10 DIAGNOSIS — R0609 Other forms of dyspnea: Secondary | ICD-10-CM | POA: Insufficient documentation

## 2024-03-29 ENCOUNTER — Other Ambulatory Visit: Payer: Self-pay | Admitting: Family Medicine

## 2024-03-29 DIAGNOSIS — Z1231 Encounter for screening mammogram for malignant neoplasm of breast: Secondary | ICD-10-CM

## 2024-04-07 ENCOUNTER — Ambulatory Visit
Admission: RE | Admit: 2024-04-07 | Discharge: 2024-04-07 | Disposition: A | Discharge status: Home / Self Care | Source: Ambulatory Visit | Attending: Family Medicine | Admitting: Family Medicine

## 2024-04-07 DIAGNOSIS — Z1231 Encounter for screening mammogram for malignant neoplasm of breast: Secondary | ICD-10-CM | POA: Diagnosis present

## 2024-04-12 ENCOUNTER — Inpatient Hospital Stay
Admission: RE | Admit: 2024-04-12 | Discharge: 2024-04-12 | Disposition: A | Discharge status: Home / Self Care | Payer: Self-pay | Source: Ambulatory Visit | Attending: Family Medicine | Admitting: Family Medicine

## 2024-04-12 ENCOUNTER — Other Ambulatory Visit: Payer: Self-pay | Admitting: *Deleted

## 2024-04-12 DIAGNOSIS — Z1231 Encounter for screening mammogram for malignant neoplasm of breast: Secondary | ICD-10-CM

## 2024-04-13 ENCOUNTER — Other Ambulatory Visit: Payer: Self-pay | Admitting: Family Medicine

## 2024-04-13 DIAGNOSIS — R928 Other abnormal and inconclusive findings on diagnostic imaging of breast: Secondary | ICD-10-CM

## 2024-04-14 ENCOUNTER — Ambulatory Visit
Admission: RE | Admit: 2024-04-14 | Discharge: 2024-04-14 | Disposition: A | Discharge status: Home / Self Care | Source: Ambulatory Visit | Attending: Family Medicine | Admitting: Family Medicine

## 2024-04-14 DIAGNOSIS — R928 Other abnormal and inconclusive findings on diagnostic imaging of breast: Secondary | ICD-10-CM | POA: Insufficient documentation

## 2024-05-18 ENCOUNTER — Encounter: Payer: Self-pay | Admitting: Physical Therapy

## 2024-05-18 ENCOUNTER — Ambulatory Visit: Attending: Family Medicine | Admitting: Physical Therapy

## 2024-05-18 ENCOUNTER — Other Ambulatory Visit: Payer: Self-pay

## 2024-05-18 DIAGNOSIS — R2689 Other abnormalities of gait and mobility: Secondary | ICD-10-CM | POA: Diagnosis present

## 2024-05-18 DIAGNOSIS — R29898 Other symptoms and signs involving the musculoskeletal system: Secondary | ICD-10-CM | POA: Diagnosis present

## 2024-05-18 DIAGNOSIS — R269 Unspecified abnormalities of gait and mobility: Secondary | ICD-10-CM | POA: Insufficient documentation
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
## 2024-05-20 ENCOUNTER — Ambulatory Visit: Admitting: Physical Therapy

## 2024-05-20 DIAGNOSIS — R2689 Other abnormalities of gait and mobility: Secondary | ICD-10-CM

## 2024-05-20 DIAGNOSIS — R269 Unspecified abnormalities of gait and mobility: Secondary | ICD-10-CM

## 2024-05-20 DIAGNOSIS — R29898 Other symptoms and signs involving the musculoskeletal system: Secondary | ICD-10-CM
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
## 2024-05-27 ENCOUNTER — Encounter: Payer: Self-pay | Admitting: Physical Therapy

## 2024-05-27 ENCOUNTER — Ambulatory Visit

## 2024-05-27 DIAGNOSIS — R2689 Other abnormalities of gait and mobility: Secondary | ICD-10-CM | POA: Diagnosis not present

## 2024-05-27 DIAGNOSIS — R269 Unspecified abnormalities of gait and mobility: Secondary | ICD-10-CM

## 2024-05-27 DIAGNOSIS — R29898 Other symptoms and signs involving the musculoskeletal system: Secondary | ICD-10-CM
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
## 2024-05-31 ENCOUNTER — Ambulatory Visit (INDEPENDENT_AMBULATORY_CARE_PROVIDER_SITE_OTHER): Admitting: Internal Medicine

## 2024-05-31 ENCOUNTER — Encounter: Payer: Self-pay | Admitting: Internal Medicine

## 2024-05-31 ENCOUNTER — Other Ambulatory Visit: Payer: Self-pay | Admitting: Internal Medicine

## 2024-05-31 DIAGNOSIS — R918 Other nonspecific abnormal finding of lung field: Secondary | ICD-10-CM | POA: Diagnosis not present

## 2024-05-31 DIAGNOSIS — I288 Other diseases of pulmonary vessels: Secondary | ICD-10-CM

## 2024-05-31 DIAGNOSIS — Z836 Family history of other diseases of the respiratory system: Secondary | ICD-10-CM | POA: Diagnosis not present

## 2024-05-31 DIAGNOSIS — R0609 Other forms of dyspnea: Secondary | ICD-10-CM

## 2024-05-31 DIAGNOSIS — J439 Emphysema, unspecified: Secondary | ICD-10-CM

## 2024-05-31 DIAGNOSIS — K449 Diaphragmatic hernia without obstruction or gangrene: Secondary | ICD-10-CM

## 2024-05-31 DIAGNOSIS — K219 Gastro-esophageal reflux disease without esophagitis: Secondary | ICD-10-CM

## 2024-05-31 LAB — PULMONARY FUNCTION TEST
DL/VA % pred: 83 %
DL/VA: 3.4 ml/min/mmHg/L
DLCO cor % pred: 67 %
DLCO cor: 12.12 ml/min/mmHg
DLCO unc % pred: 63 %
DLCO unc: 11.39 ml/min/mmHg
FEF 25-75 Post: 1 L/s
FEF 25-75 Pre: 0.56 L/s
FEF2575-%Change-Post: 79 %
FEF2575-%Pred-Post: 80 %
FEF2575-%Pred-Pre: 44 %
FEV1-%Change-Post: 24 %
FEV1-%Pred-Post: 75 %
FEV1-%Pred-Pre: 60 %
FEV1-Post: 1.34 L
FEV1-Pre: 1.08 L
FEV1FVC-%Change-Post: 4 %
FEV1FVC-%Pred-Pre: 81 %
FEV6-%Change-Post: 18 %
FEV6-%Pred-Post: 92 %
FEV6-%Pred-Pre: 77 %
FEV6-Post: 2.09 L
FEV6-Pre: 1.75 L
FEV6FVC-%Change-Post: 0 %
FEV6FVC-%Pred-Post: 103 %
FEV6FVC-%Pred-Pre: 103 %
FVC-%Change-Post: 18 %
FVC-%Pred-Post: 89 %
FVC-%Pred-Pre: 75 %
FVC-Post: 2.14 L
FVC-Pre: 1.81 L
Post FEV1/FVC ratio: 63 %
Post FEV6/FVC ratio: 98 %
Pre FEV1/FVC ratio: 60 %
Pre FEV6/FVC Ratio: 98 %
RV % pred: 147 %
RV: 3.5 L
TLC % pred: 116 %
TLC: 5.71 L

## 2024-05-31 MED ORDER — UMECLIDINIUM BROMIDE 62.5 MCG/ACT IN AEPB: 1.0000 | INHALATION_SPRAY | Freq: Every day | RESPIRATORY_TRACT | 1 refills | Status: DC

## 2024-05-31 NOTE — Patient Instructions (Signed)
 Full pft performed today

## 2024-05-31 NOTE — Progress Notes (Signed)
 Patient seen by Dr. Geronimo today. De-escalating LABA/LAMA/ICS to LAMA. Patient attempted to use Spiriva Respimat in office. PharmD asked to assist as patient was unable to use device; has difficulty twisting the device. Prefers to use device more similar to Trelegy.   After discussion, patient prefers to use Incruse Ellipta . Provided handout with written instructions on how to use Incruse Ellipta . Educated patient on how to use inhaler with demo device. Patient and daughter verbalizes understanding and agreement with plan.   Aleck Puls, PharmD, BCPS Clinical Pharmacist  [facility]

## 2024-05-31 NOTE — Patient Instructions (Addendum)
    ICD-10-CM   1. DOE (dyspnea on exertion)  R06.09     2. Interstitial lung abnormality (ILA)  R91.8     3. Family history of pulmonary fibrosis  Z83.6     4. Hiatal hernia with GERD  K44.9    K21.9     5. Pulmonary emphysema, unspecified emphysema type (HCC)  J43.9     6. 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  This to me seems like interstitial lung abnormalities where it is not fully normal but not grossly abnormal either.  Plan  - Do blood work for ANA, rheumatoid factor, cyclic Citrulline peptide, SSA SSB and SCL-70 - Hold off on genetic testing [shared decision making] - best is monitoring approach balancing side effect profile of drugs and disease burden  - Repeat spirometry and DLCO in 4 months   #Pulmonary emphysema  -This is definitely present on the CT scan.  Noted that Trelegy is not helping you.  The burden itself appears mild  Plan - Stop Trelegy - Start INCRUSE (easier technique than Spiriva) - Continue albuterol  as needed - Check alpha-1 antitrypsin phenotype  #Enlarged pulmonary artery on the echocardiogram   - This suggest you are at risk for pulmonary hypertension because of your pulmonary issues  Plan - Do echocardiogram first available [last 1 was in 2021]   Followu - 4 months visit Dr. Geronimo after spirometry and DLCO [sit/stand 15) symptom score at follow-up]

## 2024-05-31 NOTE — Addendum Note (Signed)
 Addended by: Darinda [place] L on: 05/31/2024 11:30 AM   Modules accepted: Orders

## 2024-05-31 NOTE — Progress Notes (Signed)
 Full pft performed today

## 2024-05-31 NOTE — Progress Notes (Signed)
 OV 03/04/2024  Subjective:  Patient ID: Carla Lyons, female , DOB: 07-04-1941 , age 83 y.o. , MRN: [id number] , [address] [place] [address] PCP Cyrus, Selinda Moose, PA-C Patient Care Team: Cyrus Selinda Moose, PA-C as PCP - General (Family Medicine) End, Lonni, MD as PCP - Cardiology (Cardiology)  This Provider for this visit: Treatment Team:  Attending Provider: Geronimo Amel, MD    03/04/2024 -   Chief Complaint  Patient presents with   Consult    Emphysema, SOB on exertion, no wheezing or coughing     HPI Carla Lyons 83 y.o. -is a sister of my Late patient Inocente Dry who passed away in 06/20/2021 from IPF.  Inocente was amazing supporter of pulmonary fibrosis foundation and support group and was one of the founding members.  She lived long with the IPF diagnosis.  Patient is here with her daughter Luke.  [name] lives in [place]  but patient lives in [place]..  She specifically chose to see me because of my association taking care of her sister.  She says that she has neuropathy and imbalance.  She has been using a cane for a few years.  Also intermittent short-term memory loss.  She does have moderate hiatal hernia [as described on the CT chest] although described as large by Dr. Avram in office visit March 2025.  There is a history of Cameron ulcers with this.    She is here because of concern of pulmonary fibrosis because of the family history but also because she has shortness of breath on exertion.  And the other risk factor being the hiatal hernia. She tells me that she has shortness of breath for at least a year.  It is improved with the oxygen which she has.  It is worsened by exertion but also occasionally by eating when she swallows.  She has some inhalers with her because of a diagnosis of emphysema based on CT chest but this is not helping her..  In October 2024 she she saw Dr Parris at [facility] clinic for concern of rule  out ILD and because of the shortness of breath.  She she had a concern about family history of pulmonary fibrosis and her own concern for pulmonary fibrosis.  But she says she was asked to undergo hernia repair.  Therefore she saw Dr. Avram earlier this year.  Years ago surgery was not recommended.  She is petrified of having Hartle hernia surgery.  The CT scan of the chest that I have for my visualization is 1 with contrast in October 2024.  It is hard to tell if there is ILD or not although there might be some interstitial thickening.   Review of the medical records indicate - Admission in May 2021 for TIA complicated by migraine - History of CKD stage IIIb at that time - Arthroscopic repair of the left shoulder in March 2023: - Admission February 2023 to the ER -Cardiology visit in February 2025 to Dr. Wilburn at the [facility] system which reports stress test October 2024 shows mild anterior apical defect.  Left heart catheterization showed proximal LAD 70% but otherwise moderate disease.  Subjected to medical management.  PCI was avoided.  - multiple at least 3 family members were on supplemental O2 due to hypoxemia. Her sister passed away at 74. Brother passed at 62. Another sister had IPF for 8 years after diagnosis and passed away at age 61.    CT Chest data from date:   -  personally visualized and independently interpreted : yes - my findings are: as below Narrative & Impression  CLINICAL DATA:  Bronchitis and shortness of breath.  Cough.   EXAM: CT CHEST WITH CONTRAST   TECHNIQUE: Multidetector CT imaging of the chest was performed during intravenous contrast administration.   RADIATION DOSE REDUCTION: This exam was performed according to the departmental dose-optimization program which includes automated exposure control, adjustment of the mA and/or kV according to patient size and/or use of iterative reconstruction technique.   CONTRAST:  50mL OMNIPAQUE  IOHEXOL  300 MG/ML  SOLN    COMPARISON:  Chest CT dated 04/18/2020.   FINDINGS: Cardiovascular: There is no cardiomegaly or pericardial effusion. Advanced 3 vessel coronary vascular calcification. There is moderate atherosclerotic calcification of the thoracic aorta. No aneurysmal dilatation or dissection. The origins of the great vessels of the aortic arch are patent. No pulmonary artery embolus identified.   Mediastinum/Nodes: No hilar or mediastinal adenopathy. There is a moderate size hiatal hernia. The esophagus is grossly unremarkable. No mediastinal fluid collection.   Lungs/Pleura: Background of emphysema and diffuse chronic interstitial coarsening. No focal consolidation, pleural effusion, or pneumothorax. The central airways are patent.   Upper Abdomen: Fatty liver.   Musculoskeletal: No acute osseous pathology.   IMPRESSION: 1. No acute intrathoracic pathology. No CT evidence of pulmonary artery embolus. 2. Moderate size hiatal hernia. 3. Fatty liver. 4. Aortic Atherosclerosis (ICD10-I70.0) and Emphysema (ICD10-J43.9).     Electronically Signed   By: Vanetta Chou M.D.   On: 07/20/2023 16:32   Latest Reference Range & Units 05/14/07 19:15 08/30/08 08:53 10/09/09 15:27 11/15/09 18:05 01/03/13 12:00 01/28/13 14:28 02/15/13 12:02 04/17/13 08:59 05/18/13 14:50 06/16/13 09:25 03/02/14 12:27 10/24/14 11:14 12/18/14 12:09 02/26/17 14:34 03/17/17 09:22 10/18/18 08:21 02/02/19 08:51 10/19/19 11:52 01/25/20 10:13 03/02/20 22:41 04/18/20 15:02 09/20/20 09:24  Eosinophils Absolute 0.0 - 0.5 K/uL 0.1 0.3 0.4 0.3 0.2 0.3 0.2 0.1 0.3 0.2 0.2 0.2 0.1 0.2 0.1 0.3 0.3 0.2 0.3 0.3 0.2 0.1          OV 05/31/2024 -family history of interstitial lung disease with dyspnea as a symptom.  ILD versus ILD workup in progress.  Former smoker.  Subjective:  Patient ID: Carla Lyons, female , DOB: September 21, 1941 , age 83 y.o. , MRN: [id number] , [address]: [id number] [place] [address] PCP Cyrus, Selinda Moose,  PA-C Patient Care Team: Cyrus Selinda Moose, PA-C as PCP - General (Family Medicine) End, Lonni, MD as PCP - Cardiology (Cardiology)  This Provider for this visit: Treatment Team:  Attending Provider: Geronimo Amel, MD    05/31/2024 -   Chief Complaint  Patient presents with   Medical Management of Chronic Issues   Interstitial Lung Disease    PFT done today.  Breathing is unchanged since her last visit.      HPI Journei Thomassen 83 y.o. -presents for follow-up.  Presents with Luke and her family member.  [Daughter].  Here to discuss results and also presently ILD symptom packet and questionnaire.  No new interim problems.   [facility] Comprehensive ILD Questionnaire  Symptoms:  x SYMPTOM SCALE - ILD 05/31/2024  Current weight   O2 use ra  Shortness of Breath 0 -> 5 scale with 5 being worst (score 6 If unable to do)  At rest 0  Simple tasks - showers, clothes change, eating, shaving 2  Household (dishes, doing bed, laundry) 3  Shopping 3  Walking level at own pace 3  Walking up [employer] 4  Total (  30-36) Dyspnea Score 15      Non-dyspnea symptoms (0-> 5 scale) 05/31/2024  How bad is your cough? 0  How bad is your fatigue 3  How bad is nausea 2  How bad is vomiting?  0  How bad is diarrhea? 0  How bad is anxiety? 0  How bad is depression 2  Any chronic pain - if so where and how bad 4     Past Medical History :  -Does admit to a diagnosis of emphysema made in 2024 - Does have hiatal hernia and acid reflux for several years and takes medicines - Reports positive thyroid  disease - Had a mild stroke 3 years ago uses a cane - Has chronic kidney disease for around 8 years - Has had COVID-vaccine and had disease in 2024.   ROS:  -Positive for fatigue - Positive for arthralgia - Positive for extremely rare dysphagia which started in 2024  FAMILY HISTORY of LUNG DISEASE:  Strong family history of pulmonary fibrosis and 2 sisters and 1 brother  [declined genetic testing]  PERSONAL EXPOSURE HISTORY:  Smoked between 1960 1987 5 to 6 cigarettes/day.  No marijuana no cocaine no intravenous drug use  HOME  EXPOSURE and HOBBY DETAILS :  [place].  Home with service built 38 years ago in 1987.  Detail organic antigen exposure history in the house is negative  OCCUPATIONAL HISTORY (122 questions) : She is retired.  She worked in Engineering geologist.  Detail organic and inorganic antigen exposure history at work is negative  PULMONARY TOXICITY HISTORY (27 items):  Pulmonary drug causes of ILD are negative although drug-induced eosinophilia related to allopurinol and beta-blocker she related yes  INVESTIGATIONS:      CT Chest data from date: June 2025  - personally visualized and independently interpreted : yes - my findings are: Emphysema with interstitial lung abnormalities no change over time [I do not think she has ILD although it is possible]  - Other findings include coronary artery calcification and enlarged pulmonary artery. Narrative & Impression  CLINICAL DATA:  Shortness of breath on exertion. History of pulmonary fibrosis.   EXAM: CT CHEST WITHOUT CONTRAST   TECHNIQUE: Multidetector CT imaging of the chest was performed following the standard protocol without intravenous contrast. High resolution imaging of the lungs, as well as inspiratory and expiratory imaging, was performed.   RADIATION DOSE REDUCTION: This exam was performed according to the departmental dose-optimization program which includes automated exposure control, adjustment of the mA and/or kV according to patient size and/or use of iterative reconstruction technique.   COMPARISON:  07/20/2023, 04/18/2020.   FINDINGS: Cardiovascular: Atherosclerotic calcification of the aorta, aortic valve and coronary arteries. Enlarged pulmonic trunk and heart. No pericardial effusion.   Mediastinum/Nodes: No  pathologically enlarged mediastinal or axillary lymph nodes. Hilar regions are difficult to definitively evaluate without IV contrast. Esophagus is grossly unremarkable.   Lungs/Pleura: Centrilobular and paraseptal emphysema. Scattered vague subpleural reticular densities and ground-glass without a definite zonal predominance. No traction bronchiectasis/bronchiolectasis or honeycombing. Pulmonary nodules measure up to 4 mm in the posterior left lower lobe, unchanged from 04/18/2020. Per Fleischner Society guidelines, no follow-up is necessary. No pleural fluid. Airway is unremarkable. Expiratory phase imaging was not performed in true expiration, limiting the evaluation for air trapping.   Upper Abdomen: Small hyperdense lesions in the kidneys, too small to characterize. No specific follow-up necessary. Large hiatal hernia containing approximately 2/3 of the stomach. Visualized portions  of the liver, gallbladder, adrenal glands, kidneys, spleen, pancreas, stomach and bowel are otherwise grossly unremarkable. No upper abdominal adenopathy.   Musculoskeletal: Degenerative changes in the spine.   IMPRESSION: 1. Scattered vague subpleural reticular densities and ground-glass without a definite zonal predominance. Findings appears similar to prior exams. Findings are indeterminate for UIP per consensus guidelines: Diagnosis of Idiopathic Pulmonary Fibrosis: An Official ATS/ERS/JRS/ALAT Clinical Practice Guideline. Am JINNY [employer] Crit Care Med Vol 198, Iss 5, [phone], Jun 06 2017. 2. Large hiatal hernia. 3. Aortic atherosclerosis (ICD10-I70.0). Coronary artery calcification. 4. Enlarged pulmonic trunk, indicative of pulmonary arterial hypertension. 5.  Emphysema (ICD10-J43.9).     Electronically Signed   By: Newell Eke M.D.   On: 03/21/2024 14:16    PFT     Latest Ref Rng & Units 05/31/2024    8:46 AM  PFT Results  FVC-Pre L 1.81  P  FVC-Predicted Pre % 75  P  FVC-Post  L 2.14  P  FVC-Predicted Post % 89  P  Pre FEV1/FVC % % 60  P  Post FEV1/FCV % % 63  P  FEV1-Pre L 1.08  P  FEV1-Predicted Pre % 60  P  FEV1-Post L 1.34  P  DLCO uncorrected ml/min/mmHg 11.39  P  DLCO UNC% % 63  P  DLCO corrected ml/min/mmHg 12.12  P  DLCO COR %Predicted % 67  P  DLVA Predicted % 83  P  TLC L 5.71  P  TLC % Predicted % 116  P  RV % Predicted % 147  P    P Preliminary result    Simple office walk 224 (66+46 x 2) feet Pod A at [facility] x  3 laps goal with forehead probe 05/31/2024    O2 used ra   Number laps completed Did only 1 with cane   Comments about pace can   Resting Pulse Ox/HR 94% and 52/min   Final Pulse Ox/HR 93% and 84/min   Desaturated </= 88% no   Desaturated <= 3% points no   Got Tachycardic >/= 90/min no   Symptoms at end of test No sympt   Miscellaneous comments x       LAB RESULTS last 96 hours No results found.       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   reports that she quit smoking about 22 years ago. Her smoking use included cigarettes. She started smoking about 37 years ago. She has a 3.8 pack-year smoking history. She has never been exposed to tobacco smoke. She has never used smokeless tobacco.  Past Surgical History:  Procedure Laterality Date   APPENDECTOMY     CATARACT EXTRACTION W/ INTRAOCULAR LENS IMPLANT  Right 01/08/2018   CATARACT EXTRACTION W/ INTRAOCULAR LENS IMPLANT Left 02/05/2018   CESAREAN SECTION     x 2   COLONOSCOPY  11/17/2018   ESOPHAGOGASTRODUODENOSCOPY ENDOSCOPY     LEFT HEART CATH AND CORONARY ANGIOGRAPHY Left 09/01/2023   Procedure: LEFT HEART CATH AND CORONARY ANGIOGRAPHY;  Surgeon: Florencio Cara BIRCH, MD;  Location: [facility] INVASIVE CV LAB;  Service: Cardiovascular;  Laterality: Left;   PARTIAL HYSTERECTOMY  age 26   SHOULDER ARTHROSCOPY WITH DEBRIDEMENT AND BICEP TENDON REPAIR Left 12/19/2021   Procedure: Left shoulder arthroscopy with debridment, decompression, rotator cuff repair and biceps tenodesis;  Surgeon: Edie Norleen PARAS, MD;  Location: [facility] ORS;  Service: Orthopedics;  Laterality: Left;   VAGINAL HYSTERECTOMY  age 2   partial    Allergies  Allergen Reactions   Fosamax [Alendronate Sodium]     Red and hot all over   Niacin And Related     Rash and red all over   Codeine  Phosphate Nausea And Vomiting    Immunization History  Administered Date(s) Administered   INFLUENZA, HIGH DOSE SEASONAL PF 07/27/2018, 05/20/2019   Influenza Split 08/05/2011   Influenza Whole 07/17/2004, 07/10/2009, 07/02/2010   Influenza,inj,Quad PF,6+ Mos 06/16/2013   Influenza-Unspecified 06/19/2014, 05/25/2015, 06/11/2017   PFIZER(Purple Top)SARS-COV-2 Vaccination 10/26/2019, 11/16/2019, 10/19/2020   Pneumococcal Conjugate-13 03/09/2014   Pneumococcal Polysaccharide-23 07/17/2004, 02/26/2017   Td 06/20/2006   Tdap 09/06/2020   Zoster, Live 03/09/2014    Family History  Problem Relation Age of Onset   Coronary artery disease Mother    Other Mother        bypass surgery   Dementia Mother    Osteoarthritis Father    Heart disease Father    Heart attack Maternal Grandfather    Lung disease Brother    Hypertension Brother    Lung disease Sister    Lung disease Sister    Colon cancer Neg Hx    Esophageal cancer Neg Hx    Rectal cancer Neg Hx    Stomach cancer Neg Hx     Colon polyps Neg Hx      Current Outpatient Medications:    acetaminophen  (TYLENOL ) 500 MG tablet, Take 500-1,000 mg by mouth every 6 (six) hours as needed for moderate pain (pain score 4-6) or headache., Disp: , Rfl:    allopurinol (ZYLOPRIM) 100 MG tablet, Take 100 mg by mouth daily., Disp: , Rfl:    ALPRAZolam  (XANAX ) 0.25 MG tablet, Take 1 tablet (0.25 mg total) by mouth at bedtime as needed for anxiety., Disp: 30 tablet, Rfl: 1   amLODipine  (NORVASC ) 5 MG tablet, TAKE 1 TABLET BY MOUTH EVERY DAY, Disp: 90 tablet, Rfl: 1   atorvastatin  (LIPITOR) 80 MG tablet, Take 1 tablet (80 mg total) by mouth daily. NEED APPOINTMENT, Disp: 30 tablet, Rfl: 0   b complex vitamins tablet, Take 1 tablet by mouth daily., Disp: , Rfl:    Biotin 5 MG TABS, Take 5 mg by mouth daily., Disp: , Rfl:    calcium  carbonate (OSCAL) 1500 (600 Ca) MG TABS tablet, Take 600 mg of elemental calcium  by mouth daily., Disp: , Rfl:    cholecalciferol (VITAMIN D ) 1000 UNITS tablet, Take 1,000 Units by mouth daily., Disp: , Rfl:    clopidogrel  (PLAVIX ) 75 MG tablet, Take 1 tablet (75 mg total) by mouth every Monday, Wednesday, and Friday. (Patient taking differently: Take 75 mg by mouth daily.), Disp: , Rfl:    Cyanocobalamin  1000 MCG SUBL, Place 1,000 mcg under the tongue daily., Disp: , Rfl:    ferrous sulfate 325 (65 FE) MG tablet, Take 325 mg by mouth at bedtime., Disp: , Rfl:    furosemide (LASIX) 20 MG tablet, Take 20 mg by mouth daily as needed for edema., Disp: , Rfl:    levothyroxine  (SYNTHROID ) 88 MCG tablet, TAKE 1 TABLET (88 MCG TOTAL) BY MOUTH DAILY BEFORE BREAKFAST., Disp: 90 tablet,  Rfl: 3   MELATONIN PO, Take 1 tablet by mouth at bedtime., Disp: , Rfl:    omeprazole  (PRILOSEC) 40 MG capsule, Take 1 capsule (40 mg total) by mouth daily before breakfast., Disp: 90 capsule, Rfl: 3   tobramycin-dexamethasone  (TOBRADEX) ophthalmic ointment, Place 1 application into the right eye 2 (two) times daily as needed (contact  dermatitis)., Disp: , Rfl:    umeclidinium bromide  (INCRUSE ELLIPTA ) 62.5 MCG/ACT AEPB, Inhale 1 puff into the lungs daily., Disp: 30 each, Rfl: 1      Objective:   Vitals:   05/31/24 0952  BP: 134/70  Pulse: (!) 51  SpO2: 96%  Weight: 146 lb (66.2 kg)  Height: 5' 3 (1.6 m)    Estimated body mass index is 25.86 kg/m as calculated from the following:   Height as of this encounter: 5' 3 (1.6 m).   Weight as of this encounter: 146 lb (66.2 kg).  @WEIGHTCHANGE @  [employer]   05/31/24 0952  Weight: 146 lb (66.2 kg)     Physical Exam   General: No distress. Looks swel O2 at rest: no Cane present: YES Sitting in wheel chair: no Frail: no Obese: no Neuro: Alert and Oriented x 3. GCS 15. Speech normal Psych: Pleasant Resp:=No overt respiratory distress         Assessment/     Assessment & Plan DOE (dyspnea on exertion)  Interstitial lung abnormality (ILA)  Family history of pulmonary fibrosis  Hiatal hernia with GERD  Pulmonary emphysema, unspecified emphysema type (HCC)  Enlarged pulmonary artery (HCC)    PLAN Patient Instructions      ICD-10-CM   1. DOE (dyspnea on exertion)  R06.09     2. Interstitial lung abnormality (ILA)  R91.8     3. Family history of pulmonary fibrosis  Z83.6     4. Hiatal hernia with GERD  K44.9    K21.9     5. Pulmonary emphysema, unspecified emphysema type (HCC)  J43.9     6. 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  This to me seems like interstitial lung abnormalities where it is not fully normal but not grossly abnormal either.  Plan  - Do blood work for ANA, rheumatoid factor, cyclic Citrulline peptide, SSA SSB and SCL-70 - Hold off on genetic testing [shared decision making] - best is monitoring approach balancing side effect profile of drugs and disease  burden  - Repeat spirometry and DLCO in 4 months   #Pulmonary emphysema  -This is definitely present on the CT scan.  Noted that Trelegy is not helping you.  The burden itself appears mild  Plan - Stop Trelegy - Start INCRUSE (easier technique than Spiriva) - Continue albuterol  as needed - Check alpha-1 antitrypsin phenotype  #Enlarged pulmonary artery on the echocardiogram   - This suggest you are at risk for pulmonary hypertension because of your pulmonary issues  Plan - Do echocardiogram first available [last 1 was in 2021]   Followu - 4 months visit Dr. Geronimo after spirometry and DLCO [sit/stand 15) symptom score at follow-up]    FOLLOWUP    Return for 15 min visit, after Spiro and DLCO, with Dr Geronimo, Face to Face Visit.    SIGNATURE    Dr. Dorethia Geronimo, M.D., F.C.C.P,  Pulmonary and Critical Care Medicine Staff Physician, [facility]  Center Director - Interstitial Lung Disease  Program  Pulmonary Fibrosis Foundation [facility] at [facility] [place], [place], [address]  Pager: [phone], If no answer or between  15:00h - 7:00h: call [phone] Telephone: [phone]  5:56 PM 05/31/2024   ( Level 05 visit E&M 2024: Estb >= 40 min n  visit type: on-site physical face to visit  in total care time and counseling or/and coordination of care by this undersigned MD - Dr Dorethia Cave. This includes one or more of the following on this same day 05/31/2024: pre-charting, chart review, note writing, documentation discussion of test results, diagnostic or treatment recommendations, prognosis, risks and benefits of management options, instructions, education, compliance or risk-factor reduction. It excludes time spent by the CMA or office staff in the care of the patient. Actual time 40 min)

## 2024-06-01 ENCOUNTER — Ambulatory Visit: Admitting: Physical Therapy

## 2024-06-01 DIAGNOSIS — R269 Unspecified abnormalities of gait and mobility: Secondary | ICD-10-CM

## 2024-06-01 DIAGNOSIS — R2689 Other abnormalities of gait and mobility: Secondary | ICD-10-CM | POA: Diagnosis not present

## 2024-06-01 DIAGNOSIS — R29898 Other symptoms and signs involving the musculoskeletal system: Secondary | ICD-10-CM
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
## 2024-06-03 ENCOUNTER — Ambulatory Visit: Admitting: Physical Therapy

## 2024-06-03 DIAGNOSIS — R2689 Other abnormalities of gait and mobility: Secondary | ICD-10-CM | POA: Diagnosis not present

## 2024-06-03 DIAGNOSIS — R29898 Other symptoms and signs involving the musculoskeletal system: Secondary | ICD-10-CM

## 2024-06-03 DIAGNOSIS — R269 Unspecified abnormalities of gait and mobility: Secondary | ICD-10-CM
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
## 2024-06-04 LAB — CYCLIC CITRUL PEPTIDE ANTIBODY, IGG: Cyclic Citrullin Peptide Ab: <16 U

## 2024-06-04 LAB — SJOGREN'S SYNDROME ANTIBODS(SSA + SSB)
SSA (Ro) (ENA) Antibody, IgG: <1 AI
SSB (La) (ENA) Antibody, IgG: <1 AI

## 2024-06-04 LAB — ALPHA-1 ANTITRYPSIN PHENOTYPE: A-1 Antitrypsin, Ser: 176 mg/dL (ref 83–199)

## 2024-06-04 LAB — ANTI-NUCLEAR AB-TITER (ANA TITER): ANA Titer 1: 1:40 {titer} — ABNORMAL HIGH

## 2024-06-04 LAB — ANA: Anti Nuclear Antibody (ANA): POSITIVE — AB

## 2024-06-04 LAB — RHEUMATOID FACTOR: Rheumatoid fact SerPl-aCnc: <10 [IU]/mL (ref <14)

## 2024-06-04 LAB — ANTI-SCLERODERMA ANTIBODY: Scleroderma (Scl-70) (ENA) Antibody, IgG: <1 AI

## 2024-06-08 ENCOUNTER — Ambulatory Visit: Admitting: Physical Therapy

## 2024-06-10 ENCOUNTER — Ambulatory Visit
Admission: RE | Admit: 2024-06-10 | Discharge: 2024-06-10 | Disposition: A | Discharge status: Home / Self Care | Source: Ambulatory Visit | Attending: Family Medicine | Admitting: Family Medicine

## 2024-06-10 ENCOUNTER — Other Ambulatory Visit: Payer: Self-pay | Admitting: Internal Medicine

## 2024-06-10 ENCOUNTER — Other Ambulatory Visit: Payer: Self-pay | Admitting: Family Medicine

## 2024-06-10 ENCOUNTER — Telehealth: Payer: Self-pay | Admitting: *Deleted

## 2024-06-10 DIAGNOSIS — A09 Infectious gastroenteritis and colitis, unspecified: Secondary | ICD-10-CM

## 2024-06-10 DIAGNOSIS — R1013 Epigastric pain: Secondary | ICD-10-CM | POA: Diagnosis present

## 2024-06-10 DIAGNOSIS — R509 Fever, unspecified: Secondary | ICD-10-CM

## 2024-06-10 DIAGNOSIS — R110 Nausea: Secondary | ICD-10-CM | POA: Insufficient documentation

## 2024-06-10 DIAGNOSIS — R0609 Other forms of dyspnea: Secondary | ICD-10-CM

## 2024-06-10 LAB — POCT I-STAT CREATININE: Creatinine, Ser: 1.2 mg/dL — ABNORMAL HIGH (ref 0.44–1.00)

## 2024-06-10 MED ORDER — IOHEXOL 300 MG/ML  SOLN
100.0000 mL | Freq: Once | INTRAMUSCULAR | Status: AC | PRN
  Administered 2024-06-10: 100 mL via INTRAVENOUS

## 2024-06-10 NOTE — Telephone Encounter (Signed)
 Called [facility] pharmacy to find out issue with Incruse rx. Per pharmacist PA needed, they had the wrong fax number and will refax. NFN until received.

## 2024-06-13 ENCOUNTER — Telehealth: Payer: Self-pay

## 2024-06-13 ENCOUNTER — Other Ambulatory Visit (HOSPITAL_COMMUNITY): Payer: Self-pay

## 2024-06-13 ENCOUNTER — Ambulatory Visit: Attending: Family Medicine | Admitting: Physical Therapy

## 2024-06-13 DIAGNOSIS — R29898 Other symptoms and signs involving the musculoskeletal system: Secondary | ICD-10-CM | POA: Insufficient documentation

## 2024-06-13 DIAGNOSIS — R269 Unspecified abnormalities of gait and mobility: Secondary | ICD-10-CM | POA: Insufficient documentation

## 2024-06-13 DIAGNOSIS — R2689 Other abnormalities of gait and mobility: Secondary | ICD-10-CM | POA: Diagnosis present

## 2024-06-13 NOTE — Telephone Encounter (Signed)
*  AA  Pharmacy Patient Advocate Encounter   Received notification from RX Request Messages that prior authorization for Incruse Ellipta  is required/requested.   Insurance verification completed.   The patient is insured through [employer] .   Per test claim:  Brand Spiriva Handihaler is preferred by the insurance.  If suggested medication is appropriate, Please send in a new RX and discontinue this one. If not, please advise as to why it's not appropriate so that we may request a Prior Authorization. Please note, some preferred medications may still require a PA.  If the suggested medications have not been trialed and there are no contraindications to their use, the PA will not be submitted, as it will not be approved.   Brand Spiriva Handihaler- $7.00
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
## 2024-06-13 NOTE — Telephone Encounter (Signed)
 Routing to PA team- pt needing PA for incruse   She is unable to operate the respimat device

## 2024-06-14 NOTE — Telephone Encounter (Signed)
 AA   Pharmacy Patient Advocate Encounter   Received notification from RX Request Messages that prior authorization for Incruse Ellipta  is required/requested.   Insurance verification completed.   The patient is insured through [employer] .   Per test claim:  Brand Spiriva Handihaler is preferred by the insurance.  If suggested medication is appropriate, Please send in a new RX and discontinue this one. If not, please advise as to why it's not appropriate so that we may request a Prior Authorization. Please note, some preferred medications may still require a PA.  If the suggested medications have not been trialed and there are no contraindications to their use, the PA will not be submitted, as it will not be approved.    Brand Spiriva Handihaler- $7.00

## 2024-06-15 ENCOUNTER — Ambulatory Visit: Admitting: Physical Therapy

## 2024-06-15 DIAGNOSIS — R2689 Other abnormalities of gait and mobility: Secondary | ICD-10-CM

## 2024-06-15 DIAGNOSIS — R269 Unspecified abnormalities of gait and mobility: Secondary | ICD-10-CM

## 2024-06-15 DIAGNOSIS — R29898 Other symptoms and signs involving the musculoskeletal system: Secondary | ICD-10-CM
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
## 2024-06-15 NOTE — Telephone Encounter (Signed)
 Duplicate

## 2024-06-15 NOTE — Telephone Encounter (Signed)
 Please advise Dr. Geronimo if you would like to start pt on Spriva

## 2024-06-22 ENCOUNTER — Ambulatory Visit: Admitting: Physical Therapy

## 2024-06-22 DIAGNOSIS — R269 Unspecified abnormalities of gait and mobility: Secondary | ICD-10-CM

## 2024-06-22 DIAGNOSIS — R2689 Other abnormalities of gait and mobility: Secondary | ICD-10-CM

## 2024-06-22 DIAGNOSIS — R29898 Other symptoms and signs involving the musculoskeletal system: Secondary | ICD-10-CM
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
## 2024-06-24 ENCOUNTER — Ambulatory Visit: Admitting: Physical Therapy

## 2024-06-24 DIAGNOSIS — R29898 Other symptoms and signs involving the musculoskeletal system: Secondary | ICD-10-CM

## 2024-06-24 DIAGNOSIS — R269 Unspecified abnormalities of gait and mobility: Secondary | ICD-10-CM

## 2024-06-24 DIAGNOSIS — R2689 Other abnormalities of gait and mobility: Secondary | ICD-10-CM | POA: Diagnosis not present
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
## 2024-06-27 ENCOUNTER — Ambulatory Visit (HOSPITAL_COMMUNITY)
Admission: RE | Admit: 2024-06-27 | Discharge: 2024-06-27 | Disposition: A | Discharge status: Home / Self Care | Source: Ambulatory Visit | Attending: Internal Medicine | Admitting: Internal Medicine

## 2024-06-27 DIAGNOSIS — R600 Localized edema: Secondary | ICD-10-CM

## 2024-06-27 DIAGNOSIS — R0602 Shortness of breath: Secondary | ICD-10-CM

## 2024-06-27 DIAGNOSIS — R0609 Other forms of dyspnea: Secondary | ICD-10-CM | POA: Diagnosis present

## 2024-06-27 DIAGNOSIS — I288 Other diseases of pulmonary vessels: Secondary | ICD-10-CM | POA: Insufficient documentation

## 2024-06-27 LAB — ECHOCARDIOGRAM COMPLETE
AR max vel: 1.5 cm2
AV Area VTI: 1.67 cm2
AV Area mean vel: 1.65 cm2
AV Mean grad: 5 mmHg
AV Peak grad: 11.6 mmHg
Ao pk vel: 1.7 m/s
Area-P 1/2: 2.69 cm2
S' Lateral: 2.36 cm

## 2024-06-29 ENCOUNTER — Ambulatory Visit: Admitting: Physical Therapy

## 2024-07-01 ENCOUNTER — Ambulatory Visit: Admitting: Physical Therapy

## 2024-07-06 ENCOUNTER — Ambulatory Visit: Admitting: Physical Therapy

## 2024-07-08 ENCOUNTER — Ambulatory Visit: Admitting: Physical Therapy

## 2024-07-11 ENCOUNTER — Telehealth: Payer: Self-pay

## 2024-07-11 NOTE — Telephone Encounter (Signed)
 Dr. Geronimo pt & pharmacy is calling back for alternative to Incruse Ellipta  (over $400)  Please advise if you would like pt to start on spiriva

## 2024-07-11 NOTE — Telephone Encounter (Signed)
 Copied from CRM [id number]. Topic: Clinical - Prescription Issue >> Jul 11, 2024 12:23 PM Rilla B wrote: Reason for CRM:  [facility] calling regarding a prescription for Incruse Ellipta  inhaler Carla Lyons is over $400).  Please call pharmacy @ [phone] to discuss an alternative for patient. States they have been working on this for over a week.  Unable to add Crm to 9/5 encounter. Will follow-up in 9/5 encounter. Closing this one.

## 2024-07-13 ENCOUNTER — Ambulatory Visit: Admitting: Physical Therapy

## 2024-07-15 ENCOUNTER — Ambulatory Visit: Admitting: Physical Therapy

## 2024-07-17 ENCOUNTER — Ambulatory Visit: Payer: Self-pay | Admitting: Internal Medicine

## 2024-07-17 DIAGNOSIS — R0609 Other forms of dyspnea: Secondary | ICD-10-CM

## 2024-07-17 NOTE — Telephone Encounter (Signed)
  Echo suggests presence of pulmonary hypertetnions  Plan   0 give result  - have her come in and do blood BNP   Ensure followup per recent OV with ? PFT - see recent OV      1. Left ventricular ejection fraction, by estimation, is 60 to 65%. Left ventricular ejection fraction by 3D volume is 62 %. The left ventricle has normal function. The left ventricle has no regional wall motion abnormalities. Left ventricular diastolic  parameters were normal. The average left ventricular global longitudinal strain is -18.3 %. The global longitudinal strain is normal.  2. Right ventricular systolic function is normal. The right ventricular size is normal. There is mildly elevated pulmonary artery systolic pressure. The estimated right ventricular systolic pressure is 39.0 mmHg.  3. The mitral valve is normal in structure. Trivial mitral valve regurgitation.  4. The aortic valve was not well visualized. Aortic valve regurgitation is not visualized. No aortic stenosis is present.  5. The inferior vena cava is normal in size with greater than 50% respiratory variability, suggesting right atrial pressure of 3 mmHg.   Comparison(s): EF 60%, moderate LVH, no IAS.

## 2024-07-20 ENCOUNTER — Ambulatory Visit: Admitting: Physical Therapy

## 2024-07-21 ENCOUNTER — Telehealth: Payer: Self-pay

## 2024-07-21 DIAGNOSIS — J432 Centrilobular emphysema: Secondary | ICD-10-CM

## 2024-07-21 NOTE — Telephone Encounter (Signed)
 Copied from CRM [id number]. Topic: Clinical - Prescription Issue >> Jul 11, 2024 12:23 PM Rilla B wrote: Reason for CRM:  [facility] calling regarding a prescription for Incruse Ellipta  inhaler Clotilde is over $400).  Please call pharmacy @ [phone] to discuss an alternative for patient. States they have been working on this for over a week. >> Jul 21, 2024  9:55 AM Russell PARAS wrote: Pt is contacting clinic regarding the Incruse Ellipta . Reviewed chart and advised nurse was waiting to verify with provider to move forward with alternate med Spiriva. This was noted on 10/6 but no response seen in chart from provider  Has 3 more days with Trelegy and she will not have an inhaler  Requested call back for status update  CB#  [phone]  Routing to the rx team for alternatives.

## 2024-07-22 ENCOUNTER — Ambulatory Visit: Admitting: Physical Therapy

## 2024-07-22 MED ORDER — INCRUSE ELLIPTA 62.5 MCG/ACT IN AEPB: 1.0000 | INHALATION_SPRAY | Freq: Every day | RESPIRATORY_TRACT | 6 refills | Status: DC

## 2024-07-22 NOTE — Telephone Encounter (Signed)
 Patient's plan preferes Spiriva Handihaler. Dr. Geronimo please confirm if you are ok with prescribing this.

## 2024-07-22 NOTE — Telephone Encounter (Signed)
 Sent incruse

## 2024-07-22 NOTE — Telephone Encounter (Signed)
**Note De-identified  [place] Obfuscation** Please advise 

## 2024-07-25 NOTE — Telephone Encounter (Signed)
 Pt is aware.

## 2024-07-26 ENCOUNTER — Telehealth: Payer: Self-pay | Admitting: *Deleted

## 2024-07-26 NOTE — Telephone Encounter (Signed)
 I spoke with patient, she states the Incruse is not covered by her insurance and it would be over $400 out of pocket for her to get it and she cannot afford that.  She states she received a handout yesterday that stated they would cover Spiriva.  [facility] and spoke with Marolyn, he stated they were not given alternatives that they will cover.  Advised I would send to our pharmacy team to investigate.  Pharmacy team, can you please find out what this patient's insurance company will cover that is comparable to Incruse Ellipta ?  We have been going around and around for a week now.  Thank you.

## 2024-07-26 NOTE — Telephone Encounter (Signed)
 I called and spoke with patient, she states that her insurance will not cover the Incruse Ellipta  and out of pocket it is >$400 and she cannot pay this.  Advised we would send this to our pharmacy team to see what is covered.   [facility] and spoke with Marolyn, he states that no alternatives were given.   Message sent to Dr. Geronimo about whether he wants to order Spririva HandiHaler as that is what Pharmacy states is covered by insurance.

## 2024-07-26 NOTE — Telephone Encounter (Signed)
 [facility] states that [facility] not [facility] for Inc[facility]use and would like to speak to someone regarding getting it resent to her pharmacy.  Please call [phone]

## 2024-07-26 NOTE — Telephone Encounter (Signed)
 I called and spoke with patient, she states that her insurance will not cover the Incruse Ellipta  and out of pocket it is >$400 and she cannot pay this.  Advised we would send this to our pharmacy team to see what is covered.  [facility] and spoke with Marolyn, he states that no alternatives were given.  Looks like pharmacy responded already to this and the alternative is the spiriva Handihaler.  Jenny Monette BIRCH, [facility]    07/22/24 10:33 AM Note Patient's plan preferes Spiriva Handihaler. Dr. Geronimo please confirm if you are ok with prescribing this.      Dr. Geronimo, Please advise if you want this inhaler prescribed.  Thank you.

## 2024-07-27 ENCOUNTER — Ambulatory Visit: Admitting: Physical Therapy

## 2024-07-27 NOTE — Progress Notes (Signed)
 Called and spoke to pt - advised of echo results per Dr. Geronimo. Pt has f/u appt and PFT on 10/11/24, and has a lab appt for BNP on 07/28/24. Pt verbalized understanding, NFN.

## 2024-07-28 ENCOUNTER — Other Ambulatory Visit

## 2024-07-28 DIAGNOSIS — R0609 Other forms of dyspnea: Secondary | ICD-10-CM

## 2024-07-28 LAB — BRAIN NATRIURETIC PEPTIDE: Pro B Natriuretic peptide (BNP): 96 pg/mL (ref 0.0–100.0)

## 2024-07-29 ENCOUNTER — Ambulatory Visit: Admitting: Physical Therapy

## 2024-08-01 ENCOUNTER — Ambulatory Visit: Payer: Self-pay | Admitting: Internal Medicine

## 2024-08-01 MED ORDER — SPIRIVA HANDIHALER 18 MCG IN CAPS: 1.0000 | ORAL_CAPSULE | Freq: Every day | RESPIRATORY_TRACT | 11 refills | Status: AC

## 2024-08-01 NOTE — Progress Notes (Signed)
 Normal blood BNP

## 2024-08-01 NOTE — Telephone Encounter (Signed)
 Yeah this is fine to do spiriva handihaler

## 2024-08-01 NOTE — Telephone Encounter (Signed)
 I called and spoke with patient, advised of inhaler that was sent in and explained that it comes with an inhaler and a capsule with the medication inside. I told her when she puts the capsule inside the inhaler it activates the medication.  Advised to discuss any questions with the pharmacist.  She verbalized understanding.  Nothing further needed.

## 2024-10-11 ENCOUNTER — Encounter: Payer: Self-pay | Admitting: Internal Medicine

## 2024-10-11 ENCOUNTER — Ambulatory Visit

## 2024-10-11 ENCOUNTER — Ambulatory Visit (INDEPENDENT_AMBULATORY_CARE_PROVIDER_SITE_OTHER): Admitting: Internal Medicine

## 2024-10-11 VITALS — BP 124/60 | HR 63 | Ht 63.0 in | Wt 147.0 lb

## 2024-10-11 DIAGNOSIS — I288 Other diseases of pulmonary vessels: Secondary | ICD-10-CM

## 2024-10-11 DIAGNOSIS — R0609 Other forms of dyspnea: Secondary | ICD-10-CM

## 2024-10-11 DIAGNOSIS — I2510 Atherosclerotic heart disease of native coronary artery without angina pectoris: Secondary | ICD-10-CM

## 2024-10-11 DIAGNOSIS — K449 Diaphragmatic hernia without obstruction or gangrene: Secondary | ICD-10-CM

## 2024-10-11 DIAGNOSIS — R931 Abnormal findings on diagnostic imaging of heart and coronary circulation: Secondary | ICD-10-CM | POA: Diagnosis not present

## 2024-10-11 DIAGNOSIS — Z87891 Personal history of nicotine dependence: Secondary | ICD-10-CM

## 2024-10-11 DIAGNOSIS — K219 Gastro-esophageal reflux disease without esophagitis: Secondary | ICD-10-CM

## 2024-10-11 DIAGNOSIS — J439 Emphysema, unspecified: Secondary | ICD-10-CM

## 2024-10-11 DIAGNOSIS — R918 Other nonspecific abnormal finding of lung field: Secondary | ICD-10-CM | POA: Diagnosis not present

## 2024-10-11 DIAGNOSIS — Z836 Family history of other diseases of the respiratory system: Secondary | ICD-10-CM | POA: Diagnosis not present

## 2024-10-11 LAB — PULMONARY FUNCTION TEST
DL/VA % pred: 72 %
DL/VA: 2.96 ml/min/mmHg/L
DLCO cor % pred: 58 %
DLCO cor: 10.57 ml/min/mmHg
DLCO unc % pred: 56 %
DLCO unc: 10.12 ml/min/mmHg
FEF 25-75 Pre: 0.57 L/s
FEF2575-%Pred-Pre: 45 %
FEV1-%Pred-Pre: 63 %
FEV1-Pre: 1.13 L
FEV1FVC-%Pred-Pre: 86 %
FEV6-%Pred-Pre: 77 %
FEV6-Pre: 1.75 L
FEV6FVC-%Pred-Pre: 103 %
FVC-%Pred-Pre: 74 %
FVC-Pre: 1.8 L
Pre FEV1/FVC ratio: 63 %
Pre FEV6/FVC Ratio: 97 %

## 2024-10-11 NOTE — Progress Notes (Signed)
Spiro/DLCO performed today. 

## 2024-10-11 NOTE — Patient Instructions (Addendum)
"  °    ICD-10-CM   1. DOE (dyspnea on exertion)  R06.09     2. Interstitial lung abnormality (ILA)  R91.8     3. Family history of pulmonary fibrosis  Z83.6     4. Hiatal hernia with GERD  K44.9    K21.9     5. Pulmonary emphysema, unspecified emphysema type (HCC)  J43.9     6. 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  Noted that Trelegy did NOT help you . The burde itself appears mild and SPIRIVA  helped you better  Plan - Continue SPIRIVA  handihaler -- Continue albuterol  as needed   #Enlarged pulmonary artery on the echocardiogram ad CT chest  - echo/CT and change in DLCO all indicate possibility of pulmonary hypertensisoin - This suggest you are at risk for pulmonary hypertension because of your pulmonary issues  Plan - REcommend DR ALLURI do Right heart cath on you to rule out Pulmonary hypertension   SHortness of breath  - class 3 and definitely out of proportion to lung issues.  - hiatal hernia could be playing a role but need to rule out pulmonary hypertension  Plan  - right heart cath by Dr Wilburn Gal - 6 months visit Dr. Geronimo after spirometry and DLCO [sit/stand 15) symptom score at follow-up] "

## 2024-10-11 NOTE — Progress Notes (Signed)
 "    OV 03/04/2024  Subjective:  Patient ID: Carla Lyons, female , DOB: 1941-07-19 , age 84 y.o. , MRN: [id number] , ADDRESS: [address] [place] [id number] PCP Cyrus, Selinda Moose, PA-C Patient Care Team: Cyrus Selinda Moose, PA-C as PCP - General (Family Medicine) End, Lonni, MD as PCP - Cardiology (Cardiology)  This Provider for this visit: Treatment Team:  Attending Provider: Geronimo Amel, MD    03/04/2024 -   Chief Complaint  Patient presents with   Consult    Emphysema, SOB on exertion, no wheezing or coughing     HPI Carla Lyons 84 y.o. -is a sister of my Late patient Inocente Dry who passed away in 11-14-2020 from IPF.  Inocente was amazing supporter of pulmonary fibrosis foundation and support group and was one of the founding members.  She lived long with the IPF diagnosis.  Patient is here with her daughter Carla Lyons.  [name] lives in [place]  but patient lives in [place]..  She specifically chose to see me because of my association taking care of her sister.  She says that she has neuropathy and imbalance.  She has been using a cane for a few years.  Also intermittent short-term memory loss.  She does have moderate hiatal hernia [as described on the CT chest] although described as large by Dr. Avram in office visit March 2025.  There is a history of Cameron ulcers with this.    She is here because of concern of pulmonary fibrosis because of the family history but also because she has shortness of breath on exertion.  And the other risk factor being the hiatal hernia. She tells me that she has shortness of breath for at least a year.  It is improved with the oxygen which she has.  It is worsened by exertion but also occasionally by eating when she swallows.  She has some inhalers with her because of a diagnosis of emphysema based on CT chest but this is not helping her..  In October 2024 she she saw Dr Parris at [facility] clinic for concern of  rule out ILD and because of the shortness of breath.  She she had a concern about family history of pulmonary fibrosis and her own concern for pulmonary fibrosis.  But she says she was asked to undergo hernia repair.  Therefore she saw Dr. Avram earlier this year.  Years ago surgery was not recommended.  She is petrified of having Hartle hernia surgery.  The CT scan of the chest that I have for my visualization is 1 with contrast in October 2024.  It is hard to tell if there is ILD or not although there might be some interstitial thickening.   Review of the medical records indicate - Admission in May 2021 for TIA complicated by migraine - History of CKD stage IIIb at that time - Arthroscopic repair of the left shoulder in March 2023: - Admission February 2023 to the ER -Cardiology visit in February 2025 to Dr. Wilburn at the [facility] system which reports stress test October 2024 shows mild anterior apical defect.  Left heart catheterization showed proximal LAD 70% but otherwise moderate disease.  Subjected to medical management.  PCI was avoided.  - multiple at least 3 family members were on supplemental O2 due to hypoxemia. Her sister passed away at 52. Brother passed at 66. Another sister had IPF for 8 years after diagnosis and passed away at age 18.    CT  Chest data from date:   - personally visualized and independently interpreted : yes - my findings are: as below Narrative & Impression  CLINICAL DATA:  Bronchitis and shortness of breath.  Cough.   EXAM: CT CHEST WITH CONTRAST   TECHNIQUE: Multidetector CT imaging of the chest was performed during intravenous contrast administration.   RADIATION DOSE REDUCTION: This exam was performed according to the departmental dose-optimization program which includes automated exposure control, adjustment of the mA and/or kV according to patient size and/or use of iterative reconstruction technique.   CONTRAST:  50mL OMNIPAQUE  IOHEXOL  300 MG/ML   SOLN   COMPARISON:  Chest CT dated 04/18/2020.   FINDINGS: Cardiovascular: There is no cardiomegaly or pericardial effusion. Advanced 3 vessel coronary vascular calcification. There is moderate atherosclerotic calcification of the thoracic aorta. No aneurysmal dilatation or dissection. The origins of the great vessels of the aortic arch are patent. No pulmonary artery embolus identified.   Mediastinum/Nodes: No hilar or mediastinal adenopathy. There is a moderate size hiatal hernia. The esophagus is grossly unremarkable. No mediastinal fluid collection.   Lungs/Pleura: Background of emphysema and diffuse chronic interstitial coarsening. No focal consolidation, pleural effusion, or pneumothorax. The central airways are patent.   Upper Abdomen: Fatty liver.   Musculoskeletal: No acute osseous pathology.   IMPRESSION: 1. No acute intrathoracic pathology. No CT evidence of pulmonary artery embolus. 2. Moderate size hiatal hernia. 3. Fatty liver. 4. Aortic Atherosclerosis (ICD10-I70.0) and Emphysema (ICD10-J43.9).     Electronically Signed   By: Vanetta Chou M.D.   On: 07/20/2023 16:32   Latest Reference Range & Units 05/14/07 19:15 08/30/08 08:53 10/09/09 15:27 11/15/09 18:05 01/03/13 12:00 01/28/13 14:28 02/15/13 12:02 04/17/13 08:59 05/18/13 14:50 06/16/13 09:25 03/02/14 12:27 10/24/14 11:14 12/18/14 12:09 02/26/17 14:34 03/17/17 09:22 10/18/18 08:21 02/02/19 08:51 10/19/19 11:52 01/25/20 10:13 03/02/20 22:41 04/18/20 15:02 09/20/20 09:24  Eosinophils Absolute 0.0 - 0.5 K/uL 0.1 0.3 0.4 0.3 0.2 0.3 0.2 0.1 0.3 0.2 0.2 0.2 0.1 0.2 0.1 0.3 0.3 0.2 0.3 0.3 0.2 0.1          OV 05/31/2024 -family history of interstitial lung disease with dyspnea as a symptom.  ILD versus ILD workup in progress.  Former smoker.  Subjective:  Patient ID: Carla Lyons, female , DOB: 28-Dec-1940 , age 84 y.o. , MRN: [id number] , ADDRESS: [address] [place] [address] PCP Cyrus, Selinda Moose, PA-C Patient Care Team: Cyrus Selinda Moose, PA-C as PCP - General (Family Medicine) End, Lonni, MD as PCP - Cardiology (Cardiology)  This Provider for this visit: Treatment Team:  Attending Provider: Geronimo Amel, MD    05/31/2024 -   Chief Complaint  Patient presents with   Medical Management of Chronic Issues   Interstitial Lung Disease    PFT done today.  Breathing is unchanged since her last visit.      HPI Carla Lyons 84 y.o. -presents for follow-up.  Presents with Carla Lyons and her family member.  [Daughter].  Here to discuss results and also presently ILD symptom packet and questionnaire.  No new interim problems.   [facility] Comprehensive ILD Questionnaire  Symptoms:   Past Medical History :  -Does admit to a diagnosis of emphysema made in 2024 - Does have hiatal hernia and acid reflux for several years and takes medicines - Reports positive thyroid  disease - Had a mild stroke 3 years ago uses a cane - Has chronic kidney disease for around 8 years - Has had COVID-vaccine and had disease in  2024.   ROS:  -Positive for fatigue - Positive for arthralgia - Positive for extremely rare dysphagia which started in 2024  FAMILY HISTORY of LUNG DISEASE:  Strong family history of pulmonary fibrosis and 2 sisters and 1 brother [declined genetic testing]  PERSONAL EXPOSURE HISTORY:  Smoked between 1960 1987 5 to 6 cigarettes/day.  No marijuana no cocaine no intravenous drug use  HOME  EXPOSURE and HOBBY DETAILS :  [place].  Home with service built 38 years ago in 1987.  Detail organic antigen exposure history in the house is negative  OCCUPATIONAL HISTORY (122 questions) : She is retired.  She worked in engineering geologist.  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    OV 10/11/2024  Subjective:  Patient ID: Carla Lyons, female , DOB: July 01, 1941 , age 5 y.o. , MRN: [id number] , ADDRESS: [address] [place] [address] PCP Cyrus, Selinda Moose, PA-C Patient Care Team: Cyrus Selinda Moose, PA-C as PCP - General (Family Medicine) End, Lonni, MD as PCP - Cardiology (Cardiology)  This Provider for this visit: Treatment Team:  Attending Provider: Geronimo Amel, MD    10/11/2024 -   Chief Complaint  Patient presents with   Medical Management of Chronic Issues   Shortness of Breath    PFT done today. Breathing is about the same, maybe slight improvement since her last visit.      HPI Ignacia Gentzler 84 y.o. -    #Interstitial lung abnormalities with -  family history of pulmonary fibrosis on expectant follow-up - Moderate to large hiatal hernia - Former smoker - ANA trace +1: 40  #Moderate to large hiatal hernia  # #Coronary artery disease significant calcifications with 70% obstructive lesion but not stentable November 2024  #Mild emphysema alpha-1 normal on Spiriva   #Evidence of pulmonary hypertension on echocardiogram late 2025.   Tamula Morrical is an 84 year old female who presents for follow-up on her shortness of breath and review of recent test results.  She experiences ongoing shortness of breath, particularly during physical activities such as climbing stairs or walking long distances. While sitting, she does not experience significant shortness of breath, but activities like moving from the living room to  the bathroom can leave her completely out of breath. She is unable to do stairs at all due to exhaustion from shortness of breath.  She has been using Spiriva  inhaler and finds it more effective than Trelegy, which she previously used. She also has oxygen at home, which she uses as needed and finds it helpful, though she does not use it excessively.  In the past year, she underwent a cardiac stress test and a left heart catheterization at [facility] due to concerns about potential blockages. The procedure revealed heavy calcification, and no stent was placed. She recalls the procedure was done at [facility].  No new health issues, hospital admissions, emergency room visits, or surgeries since her last visit.  There have been no changes to her medications in the past couple of months.  No chest pain or cyanosis. She has received her flu shot  SYMPTOM SCALE - ILD 05/31/2024 10/11/2024 Just ILA LARge Hiatal hernia Mild emphysema ENlarged PA   Current weight    O2 use ra ra  Shortness of Breath 0 -> 5 scale with 5 being worst (score 6 If unable to do)   At rest 0 0  Simple tasks - showers, clothes change, eating, shaving 2 3  Household (dishes, doing bed, laundry) 3 3  Shopping 3 3  Walking level at own pace 3 2  Walking up Stairs 4 5  Total (30-36) Dyspnea Score 15 18      Non-dyspnea symptoms (0-> 5 scale) 05/31/2024 10/11/2024   How bad is your cough? 0 1  How bad is your fatigue 3 2  How bad is nausea 2 0  How bad is vomiting?  0 0  How bad is diarrhea? 0 1  How bad is anxiety? 0 2  How bad is depression 2 2  Any chronic pain - if so where and how bad 4 0         SIT STAND TEST - goal 15 times   10/11/2024    O2 used ra   PRobe - finter or forehead finger   Number sit and stand completed - goal 15 15   Time taken to complete 56 sec   Resting Pulse Ox/HR/Dyspnea  99% and 63/min and dyspnea of 3/10    Peak measures 99 % and 101/min and dyspnea of 5/10   Final  Pulse Ox/HR 98% and 70/min and dyspnea of 3/10   Desaturated </= 88% no   Desaturated <= 3% points no   Got Tachycardic >/= 90/min yes   Miscellaneous comments dyspniec     CT Chest data from date: HRCT 03/10/24  - personally visualized and independently interpreted : yes - my findings are: ILA Narrative & Impression  CLINICAL DATA:  Shortness of breath on exertion. History of pulmonary fibrosis.   EXAM: CT CHEST WITHOUT CONTRAST   TECHNIQUE: Multidetector CT imaging of the chest was performed following the standard protocol without intravenous contrast. High resolution imaging of the lungs, as well as inspiratory and expiratory imaging, was performed.   RADIATION DOSE REDUCTION: This exam was performed according to the departmental dose-optimization program which includes automated exposure control, adjustment of the mA and/or kV according to patient size and/or use of iterative reconstruction technique.   COMPARISON:  07/20/2023, 04/18/2020.   FINDINGS: Cardiovascular: Atherosclerotic calcification of the aorta, aortic valve and coronary arteries. Enlarged pulmonic trunk and heart. No pericardial effusion.   Mediastinum/Nodes: No pathologically enlarged mediastinal or axillary lymph nodes. Hilar regions are difficult to definitively evaluate without IV contrast. Esophagus is grossly unremarkable.   Lungs/Pleura: Centrilobular and paraseptal emphysema. Scattered vague subpleural reticular densities and ground-glass without a definite zonal predominance. No traction bronchiectasis/bronchiolectasis or honeycombing. Pulmonary nodules measure up to 4 mm in the posterior left lower lobe, unchanged from 04/18/2020. Per Fleischner Society guidelines, no follow-up is necessary. No pleural fluid. Airway is unremarkable. Expiratory phase imaging was not performed in true expiration, limiting the evaluation for air trapping.   Upper Abdomen: Small hyperdense lesions in the  kidneys, too small to characterize. No specific follow-up necessary. Large hiatal hernia containing approximately 2/3 of the stomach. Visualized portions of the liver, gallbladder, adrenal glands, kidneys, spleen, pancreas, stomach and bowel are otherwise grossly unremarkable. No  upper abdominal adenopathy.   Musculoskeletal: Degenerative changes in the spine.   IMPRESSION: 1. Scattered vague subpleural reticular densities and ground-glass without a definite zonal predominance. Findings appears similar to prior exams. Findings are indeterminate for UIP per consensus guidelines: Diagnosis of Idiopathic Pulmonary Fibrosis: An Official ATS/ERS/JRS/ALAT Clinical Practice Guideline. Am JINNY [employer] Crit Care Med Vol 198, Iss 5, [phone], Jun 06 2017. 2. Large hiatal hernia. 3. Aortic atherosclerosis (ICD10-I70.0). Coronary artery calcification. 4. Enlarged pulmonic trunk, indicative of pulmonary arterial hypertension. 5.  Emphysema (ICD10-J43.9).     Electronically Signed   By: Newell Eke M.D.   On: 03/21/2024 14:16      PFT     Latest Ref Rng & Units 10/11/2024    8:17 AM 05/31/2024    8:46 AM  PFT Results  FVC-Pre L 1.80  P 1.81   FVC-Predicted Pre % 74  P 75   FVC-Post L  2.14   FVC-Predicted Post %  89   Pre FEV1/FVC % % 63  P 60   Post FEV1/FCV % %  63   FEV1-Pre L 1.13  P 1.08   FEV1-Predicted Pre % 63  P 60   FEV1-Post L  1.34   DLCO uncorrected ml/min/mmHg 10.12  P 11.39   DLCO UNC% % 56  P 63   DLCO corrected ml/min/mmHg 10.57  P 12.12   DLCO COR %Predicted % 58  P 67   DLVA Predicted % 72  P 83   TLC L  5.71   TLC % Predicted %  116   RV % Predicted %  147     P Preliminary result    Latest Reference Range & Units 05/31/24 11:15  A-1 Antitrypsin, Ser 83 - 199 mg/dL 823  Anti Nuclear Antibody (ANA) NEGATIVE  POSITIVE !  ANA Pattern 1  Cytoplasmic !  ANA Titer 1 titer 1:40 (H)  Cyclic Citrullin Peptide Ab UNITS <16  RA Latex Turbid. <14 IU/mL <10  SSA  (Ro) (ENA) Antibody, IgG <1.0 NEG AI <1.0 NEG  SSB (La) (ENA) Antibody, IgG <1.0 NEG AI <1.0 NEG  Scleroderma (Scl-70) (ENA) Antibody, IgG <1.0 NEG AI <1.0 NEG  !: Data is abnormal (H): Data is abnormally high  ECHO 06/27/24   Sonographer Comments: Suboptimal parasternal window. Image acquisition  challenging due to patient body habitus.  IMPRESSIONS     1. Left ventricular ejection fraction, by estimation, is 60 to 65%. Left  ventricular ejection fraction by 3D volume is 62 %. The left ventricle has  normal function. The left ventricle has no regional wall motion  abnormalities. Left ventricular diastolic   parameters were normal. The average left ventricular global longitudinal  strain is -18.3 %. The global longitudinal strain is normal.   2. Right ventricular systolic function is normal. The right ventricular  size is normal. There is mildly elevated pulmonary artery systolic  pressure. The estimated right ventricular systolic pressure is 39.0 mmHg.   3. The mitral valve is normal in structure. Trivial mitral valve  regurgitation.   4. The aortic valve was not well visualized. Aortic valve regurgitation  is not visualized. No aortic stenosis is present.   5. The inferior vena cava is normal in size with greater than 50%  respiratory variability, suggesting right atrial pressure of 3 mmHg.   Comparison(s): EF 60%, moderate LVH, no IAS.   LAB RESULTS last 96 hours No results found.       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   reports that she quit smoking about 23 years ago. Her smoking use included cigarettes. She started smoking about 38 years ago. She has a 3.8 pack-year smoking history. She has never been exposed to tobacco smoke. She has never used smokeless tobacco.  Past Surgical History:  Procedure Laterality Date   APPENDECTOMY     CATARACT EXTRACTION W/ INTRAOCULAR LENS IMPLANT Right 01/08/2018   CATARACT EXTRACTION W/ INTRAOCULAR LENS IMPLANT Left 02/05/2018   CESAREAN SECTION     x 2   COLONOSCOPY  11/17/2018   ESOPHAGOGASTRODUODENOSCOPY ENDOSCOPY     LEFT HEART CATH AND CORONARY ANGIOGRAPHY Left 09/01/2023   Procedure: LEFT HEART CATH AND CORONARY ANGIOGRAPHY;  Surgeon: Florencio Cara BIRCH, MD;  Location: [facility] INVASIVE CV LAB;  Service: Cardiovascular;  Laterality: Left;   PARTIAL HYSTERECTOMY  age 51   SHOULDER ARTHROSCOPY WITH DEBRIDEMENT AND BICEP TENDON REPAIR Left 12/19/2021   Procedure: Left shoulder arthroscopy with debridment, decompression, rotator cuff repair and biceps tenodesis;  Surgeon: Edie Norleen PARAS, MD;  Location: [facility] ORS;  Service: Orthopedics;  Laterality: Left;   VAGINAL HYSTERECTOMY  age 25   partial    Allergies[1]  Immunization History  Administered Date(s) Administered   INFLUENZA, HIGH DOSE SEASONAL PF 07/27/2018, 05/20/2019, 07/06/2024   Influenza Split 08/05/2011   Influenza Whole 07/17/2004, 07/10/2009, 07/02/2010   Influenza,inj,Quad PF,6+ Mos 06/16/2013   Influenza-Unspecified 06/19/2014, 05/25/2015, 06/11/2017   PFIZER(Purple Top)SARS-COV-2 Vaccination 10/26/2019, 11/16/2019, 10/19/2020   Pneumococcal Conjugate-13 03/09/2014   Pneumococcal Polysaccharide-23 07/17/2004, 02/26/2017   Td 06/20/2006   Tdap 09/06/2020   Zoster, Live 03/09/2014    Family History  Problem Relation Age of  Onset   Coronary artery disease Mother    Other Mother        bypass surgery   Dementia Mother    Osteoarthritis Father    Heart disease Father    Heart attack Maternal Grandfather    Lung disease Brother    Hypertension Brother    Lung disease Sister    Lung disease Sister    Colon cancer Neg Hx    Esophageal cancer Neg Hx    Rectal cancer Neg Hx    Stomach cancer Neg Hx    Colon polyps Neg Hx     Current Medications[2]      Objective:   Vitals:   10/11/24 0855  BP: 124/60  Pulse: 63  SpO2: 99%  Weight: 147 lb (66.7 kg)  Height: 5' 3 (1.6 m)    Estimated body mass index is 26.04 kg/m as calculated from the following:   Height as of this encounter: 5' 3 (1.6 m).   Weight as of this encounter: 147 lb (66.7 kg).  @WEIGHTCHANGE @  [employer]   10/11/24 0855  Weight: 147 lb (66.7 kg)     Physical Exam   General: No distress. Looks well. HAS CAN O2 at rest: no Cane present: YES Sitting in wheel chair: no Frail: no Obese: no Neuro: Alert and Oriented x 3. GCS 15. Speech normal Psych: Pleasant Resp:  Barrel Chest - no.  Wheeze - no, Crackles -  no, No overt respiratory distress CVS: Normal heart sounds. Murmurs - no Ext: Stigmata of Connective Tissue Disease - no HEENT: Normal upper airway. PEERL +. No post nasal drip        Assessment/     Assessment & Plan Interstitial lung abnormality (ILA)  Family history of pulmonary fibrosis  Hiatal hernia with GERD  Pulmonary emphysema (HCC)  Enlarged pulmonary artery (HCC)  Coronary artery calcification seen on CT scan    PLAN Patient Instructions      ICD-10-CM   1. DOE (dyspnea on exertion)  R06.09     2. Interstitial lung abnormality (ILA)  R91.8     3. Family history of pulmonary fibrosis  Z83.6     4. Hiatal hernia with GERD  K44.9    K21.9     5. Pulmonary emphysema, unspecified emphysema type (HCC)  J43.9     6. Enlarged pulmonary artery (HCC)  I28.8      # Interstitial  lung abnormality with trace ANA positive # Family history of pulmonary fibrosis  -Looking at the CT scan including Abd CT lung image Sept 2025 there is just  interstitial lung abnormalities which should not make you this short of breath  Plan  -- best is monitoring approach balancing side effect profile of drugs and disease burden  - Repeat spirometry and DLCO in 6 months   #Pulmonary emphysema  -This is definitely present on the CT scan.  Noted that Trelegy did NOT help you . The burde itself appears mild and SPIRIVA  helped you better  Plan - Continue SPIRIVA  handihaler -- Continue albuterol  as needed   #Enlarged pulmonary artery on the echocardiogram ad CT chest  - echo/CT and change in DLCO all indicate possibility of pulmonary hypertensisoin - This suggest you are at risk for pulmonary hypertension because of your pulmonary issues  Plan - REcommend DR ALLURI do Right heart cath on you to rule out Pulmonary hypertension   SHortness of breath  - class 3 and definitely out of proportion to lung issues.  - hiatal hernia could be playing a role but need to rule out pulmonary hypertension  Plan  - right heart cath by Dr Wilburn Gal - 6 months visit Dr. Geronimo after spirometry and DLCO [sit/stand 15) symptom score at follow-up]    FOLLOWUP    Return for - 6 months visit Dr. Geronimo after spirometry and DLCO [sit/stand 15) symptom score at follow-up].    SIGNATURE    Dr. Dorethia Geronimo, M.D., F.C.C.P,  Pulmonary and Critical Care Medicine Staff Physician, [facility] Director - Interstitial Lung Disease  Program  Pulmonary Fibrosis [facility] at [facility] [place], [place], [address]  Pager: [phone], If no answer or between  15:00h - 7:00h: call [phone] Telephone: [phone]  10:04 AM 10/11/2024     [1]  Allergies Allergen Reactions   Fosamax [Alendronate Sodium]     Red and hot all over    Niacin And Related     Rash and red all over   Codeine  Phosphate Nausea And Vomiting  [2]  Current Outpatient Medications:    acetaminophen  (TYLENOL ) 500 MG tablet, Take 500-1,000 mg by mouth every 6 (six) hours as needed for moderate pain (pain score 4-6) or headache., Disp: , Rfl:    allopurinol (ZYLOPRIM) 100 MG tablet, Take 100 mg by mouth daily., Disp: , Rfl:    ALPRAZolam  (XANAX ) 0.25 MG tablet, Take  1 tablet (0.25 mg total) by mouth at bedtime as needed for anxiety., Disp: 30 tablet, Rfl: 1   amLODipine  (NORVASC ) 5 MG tablet, TAKE 1 TABLET BY MOUTH EVERY DAY, Disp: 90 tablet, Rfl: 1   atorvastatin  (LIPITOR) 80 MG tablet, Take 1 tablet (80 mg total) by mouth daily. NEED APPOINTMENT, Disp: 30 tablet, Rfl: 0   b complex vitamins tablet, Take 1 tablet by mouth daily., Disp: , Rfl:    Biotin 5 MG TABS, Take 5 mg by mouth daily., Disp: , Rfl:    calcium  carbonate (OSCAL) 1500 (600 Ca) MG TABS tablet, Take 600 mg of elemental calcium  by mouth daily., Disp: , Rfl:    cholecalciferol (VITAMIN D ) 1000 UNITS tablet, Take 1,000 Units by mouth daily., Disp: , Rfl:    clopidogrel  (PLAVIX ) 75 MG tablet, Take 1 tablet (75 mg total) by mouth every Monday, Wednesday, and Friday. (Patient taking differently: Take 75 mg by mouth daily.), Disp: , Rfl:    Cyanocobalamin  1000 MCG SUBL, Place 1,000 mcg under the tongue daily., Disp: , Rfl:    ferrous sulfate 325 (65 FE) MG tablet, Take 325 mg by mouth at bedtime., Disp: , Rfl:    furosemide (LASIX) 20 MG tablet, Take 20 mg by mouth daily as needed for edema., Disp: , Rfl:    levothyroxine  (SYNTHROID ) 88 MCG tablet, TAKE 1 TABLET (88 MCG TOTAL) BY MOUTH DAILY BEFORE BREAKFAST., Disp: 90 tablet, Rfl: 3   MELATONIN PO, Take 1 tablet by mouth at bedtime., Disp: , Rfl:    omeprazole  (PRILOSEC) 40 MG capsule, Take 1 capsule (40 mg total) by mouth daily before breakfast., Disp: 90 capsule, Rfl: 3   Tiotropium Bromide (SPIRIVA  HANDIHALER) 18 MCG CAPS, Place 1 puff into  inhaler and inhale daily. Rinse after use., Disp: 30 capsule, Rfl: 11   tobramycin-dexamethasone  (TOBRADEX) ophthalmic ointment, Place 1 application into the right eye 2 (two) times daily as needed (contact dermatitis)., Disp: , Rfl:   "

## 2024-10-11 NOTE — Patient Instructions (Signed)
Spiro/DLCO performed today.
